# Patient Record
Sex: Male | Born: 1965 | Race: Black or African American | Hispanic: No | Marital: Married | Smoking: Former smoker
Health system: Southern US, Community
[De-identification: ages and names within clinical notes are randomized; demographics above are authoritative.]

## PROBLEM LIST (undated history)

## (undated) DIAGNOSIS — M1990 Unspecified osteoarthritis, unspecified site: Secondary | ICD-10-CM

## (undated) DIAGNOSIS — Z720 Tobacco use: Secondary | ICD-10-CM

## (undated) DIAGNOSIS — I208 Other forms of angina pectoris: Secondary | ICD-10-CM

## (undated) DIAGNOSIS — T7840XA Allergy, unspecified, initial encounter: Secondary | ICD-10-CM

## (undated) DIAGNOSIS — K922 Gastrointestinal hemorrhage, unspecified: Secondary | ICD-10-CM

## (undated) DIAGNOSIS — R079 Chest pain, unspecified: Secondary | ICD-10-CM

## (undated) DIAGNOSIS — I2089 Other forms of angina pectoris: Secondary | ICD-10-CM

## (undated) DIAGNOSIS — K2970 Gastritis, unspecified, without bleeding: Secondary | ICD-10-CM

## (undated) DIAGNOSIS — I10 Essential (primary) hypertension: Secondary | ICD-10-CM

## (undated) DIAGNOSIS — D649 Anemia, unspecified: Secondary | ICD-10-CM

## (undated) DIAGNOSIS — G4730 Sleep apnea, unspecified: Secondary | ICD-10-CM

## (undated) DIAGNOSIS — K219 Gastro-esophageal reflux disease without esophagitis: Secondary | ICD-10-CM

## (undated) DIAGNOSIS — E785 Hyperlipidemia, unspecified: Secondary | ICD-10-CM

## (undated) HISTORY — DX: Sleep apnea, unspecified: G47.30

## (undated) HISTORY — DX: Tobacco use: Z72.0

## (undated) HISTORY — DX: Allergy, unspecified, initial encounter: T78.40XA

## (undated) HISTORY — DX: Chest pain, unspecified: R07.9

## (undated) HISTORY — DX: Essential (primary) hypertension: I10

## (undated) HISTORY — DX: Unspecified osteoarthritis, unspecified site: M19.90

## (undated) HISTORY — DX: Hyperlipidemia, unspecified: E78.5

## (undated) HISTORY — PX: CARDIAC CATHETERIZATION: SHX172

## (undated) HISTORY — DX: Anemia, unspecified: D64.9

## (undated) HISTORY — PX: TOOTH EXTRACTION: SUR596

## (undated) HISTORY — DX: Gastrointestinal hemorrhage, unspecified: K92.2

---

## 1997-06-04 ENCOUNTER — Emergency Department (HOSPITAL_COMMUNITY): Admission: EM | Admit: 1997-06-04 | Discharge: 1997-06-04 | Payer: Self-pay | Admitting: Emergency Medicine

## 2000-02-29 ENCOUNTER — Emergency Department (HOSPITAL_COMMUNITY): Admission: EM | Admit: 2000-02-29 | Discharge: 2000-02-29 | Payer: Self-pay

## 2003-12-25 ENCOUNTER — Emergency Department (HOSPITAL_COMMUNITY): Admission: EM | Admit: 2003-12-25 | Discharge: 2003-12-25 | Payer: Self-pay | Admitting: Emergency Medicine

## 2005-11-15 ENCOUNTER — Emergency Department (HOSPITAL_COMMUNITY): Admission: EM | Admit: 2005-11-15 | Discharge: 2005-11-15 | Payer: Self-pay | Admitting: Emergency Medicine

## 2012-02-15 ENCOUNTER — Emergency Department (HOSPITAL_COMMUNITY)
Admission: EM | Admit: 2012-02-15 | Discharge: 2012-02-15 | Disposition: A | Discharge status: Home / Self Care | Payer: Medicaid Other | Attending: Emergency Medicine | Admitting: Emergency Medicine

## 2012-02-15 ENCOUNTER — Encounter (HOSPITAL_COMMUNITY): Payer: Self-pay | Admitting: Emergency Medicine

## 2012-02-15 DIAGNOSIS — I10 Essential (primary) hypertension: Secondary | ICD-10-CM | POA: Insufficient documentation

## 2012-02-15 DIAGNOSIS — S93409A Sprain of unspecified ligament of unspecified ankle, initial encounter: Secondary | ICD-10-CM | POA: Insufficient documentation

## 2012-02-15 DIAGNOSIS — Y929 Unspecified place or not applicable: Secondary | ICD-10-CM | POA: Insufficient documentation

## 2012-02-15 DIAGNOSIS — Y939 Activity, unspecified: Secondary | ICD-10-CM | POA: Insufficient documentation

## 2012-02-15 DIAGNOSIS — R269 Unspecified abnormalities of gait and mobility: Secondary | ICD-10-CM | POA: Insufficient documentation

## 2012-02-15 DIAGNOSIS — Z79899 Other long term (current) drug therapy: Secondary | ICD-10-CM | POA: Insufficient documentation

## 2012-02-15 DIAGNOSIS — F17200 Nicotine dependence, unspecified, uncomplicated: Secondary | ICD-10-CM | POA: Insufficient documentation

## 2012-02-15 DIAGNOSIS — X500XXA Overexertion from strenuous movement or load, initial encounter: Secondary | ICD-10-CM | POA: Insufficient documentation

## 2012-02-15 NOTE — ED Notes (Signed)
Pt reports pain and swelling in back of l/heel x 1 week

## 2012-02-15 NOTE — ED Provider Notes (Signed)
History   This chart was scribed for Darnelle Going, a non-physician practitioner working with Juliet Rude. Rubin Payor, MD by Lewanda Rife, ED Scribe. This patient was seen in room WTR8/WTR8 and the patient's care was started at 3:45 pm.   CSN: [id number]  Arrival date & time 02/15/12  1439   First MD Initiated Contact with Patient 02/15/12 1528      Chief Complaint  Patient presents with  . Joint Swelling  . Foot Pain    (Consider location/radiation/quality/duration/timing/severity/associated sxs/prior treatment) HPI Lee Krause is a 47 y.o. male who presents to the Emergency Department complaining of worsening constant left ankle pain onset 1 week. Pt states he pulled off his galoshes and yanked of the  on his left foot. Pt reports mild swelling to posterior left ankle. Pt reports pain is moderate in severity at this time. Pt denies any other injuries. Pt reports he has been limping for 2 days. Pt denies taking any pain medications at home to treat pain.    History reviewed. No pertinent past medical history.  History reviewed. No pertinent past surgical history.  Family History  Problem Relation Age of Onset  . Hypertension Father     History  Substance Use Topics  . Smoking status: Current Every Day Smoker    Types: Cigarettes  . Smokeless tobacco: Not on file  . Alcohol Use: Yes      Review of Systems  Constitutional: Negative.   HENT: Negative.   Respiratory: Negative.   Cardiovascular: Negative.   Gastrointestinal: Negative.   Musculoskeletal: Positive for joint swelling and gait problem (limping).       Left ankle pain  Skin: Negative.   Neurological: Negative.   Hematological: Negative.   Psychiatric/Behavioral: Negative.   All other systems reviewed and are negative.   A complete 10 system review of systems was obtained and all systems are negative except as noted in the HPI and PMH.    Allergies  Review of patient's allergies indicates no  known allergies.  Home Medications   Current Outpatient Rx  Name  Route  Sig  Dispense  Refill  . IBUPROFEN 200 MG PO TABS   Oral   Take 400 mg by mouth every 6 (six) hours as needed.         Marland Kitchen RANITIDINE HCL 150 MG PO CAPS   Oral   Take 150 mg by mouth daily as needed. Acid reflux.           BP 133/75  Pulse 63  Temp 98.6 F (37 C) (Oral)  Resp 18  SpO2 100%  Physical Exam  Nursing note and vitals reviewed. Constitutional: He is oriented to person, place, and time. He appears well-developed and well-nourished. No distress.  HENT:  Head: Normocephalic and atraumatic.  Eyes: Conjunctivae normal and EOM are normal.  Neck: Neck supple. No tracheal deviation present.  Cardiovascular: Normal rate.   Pulmonary/Chest: Effort normal. No respiratory distress.  Musculoskeletal: Normal range of motion. He exhibits edema (mild to left ankle) and tenderness.       Mild tenderness to talofibular ligament    Neurological: He is alert and oriented to person, place, and time. He has normal strength.  Skin: Skin is warm and dry.  Psychiatric: He has a normal mood and affect. His behavior is normal.    ED Course  Procedures (including critical care time) 3:50 PM Pt given cam walker boot and referral to follow up with orthopedic. Pt informed he will be  able to transition out of the boot in a week.    Medications  ibuprofen (ADVIL,MOTRIN) 200 MG tablet (not administered)  ranitidine (ZANTAC) 150 MG capsule (not administered)    Labs Reviewed - No data to display No results found.   1. Ankle sprain       MDM  Pt has been advised of the symptoms that warrant their return to the ED. Patient has voiced understanding and has agreed to follow-up with the PCP or specialist.    I personally performed the services described in this documentation, which was scribed in my presence. The recorded information has been reviewed and is accurate.    Dorthula Matas, PA 02/16/12  [phone]

## 2012-02-17 NOTE — ED Provider Notes (Signed)
Medical screening examination/treatment/procedure(s) were performed by non-physician practitioner and as supervising physician I was immediately available for consultation/collaboration.  Brylinn Teaney R. Treyveon Mochizuki, MD 02/17/12 1456 

## 2012-11-03 ENCOUNTER — Emergency Department (HOSPITAL_COMMUNITY): Payer: Medicaid Other

## 2012-11-03 ENCOUNTER — Encounter (HOSPITAL_COMMUNITY): Payer: Self-pay | Admitting: Emergency Medicine

## 2012-11-03 ENCOUNTER — Emergency Department (HOSPITAL_COMMUNITY)
Admission: EM | Admit: 2012-11-03 | Discharge: 2012-11-03 | Disposition: A | Discharge status: Home / Self Care | Payer: Medicaid Other | Attending: Emergency Medicine | Admitting: Emergency Medicine

## 2012-11-03 DIAGNOSIS — F17200 Nicotine dependence, unspecified, uncomplicated: Secondary | ICD-10-CM | POA: Insufficient documentation

## 2012-11-03 DIAGNOSIS — R0789 Other chest pain: Secondary | ICD-10-CM

## 2012-11-03 DIAGNOSIS — Z7982 Long term (current) use of aspirin: Secondary | ICD-10-CM | POA: Insufficient documentation

## 2012-11-03 DIAGNOSIS — Z79899 Other long term (current) drug therapy: Secondary | ICD-10-CM | POA: Insufficient documentation

## 2012-11-03 LAB — CBC
HCT: 38.9 % — ABNORMAL LOW (ref 39.0–52.0)
Hemoglobin: 13.7 g/dL (ref 13.0–17.0)
MCH: 30.9 pg (ref 26.0–34.0)
MCV: 87.6 fL (ref 78.0–100.0)
RBC: 4.44 MIL/uL (ref 4.22–5.81)
RDW: 12.5 % (ref 11.5–15.5)
WBC: 10.3 10*3/uL (ref 4.0–10.5)

## 2012-11-03 LAB — BASIC METABOLIC PANEL
BUN: 19 mg/dL (ref 6–23)
CO2: 22 mEq/L (ref 19–32)
Chloride: 100 mEq/L (ref 96–112)
Creatinine, Ser: 1.24 mg/dL (ref 0.50–1.35)
Glucose, Bld: 101 mg/dL — ABNORMAL HIGH (ref 70–99)

## 2012-11-03 LAB — POCT I-STAT TROPONIN I

## 2012-11-03 NOTE — ED Notes (Signed)
Pt states he feels aas if his heart is skipping a beat denies any pain but says it feels like a pressure especially when laying down. Denies SOB or dizziness. States is in no distress at the moment

## 2012-11-03 NOTE — ED Provider Notes (Signed)
CSN: [id number]     Arrival date & time 11/03/12  1715 History   First MD Initiated Contact with Patient 11/03/12 1821     Chief Complaint  Patient presents with  . Chest Pain   (Consider location/radiation/quality/duration/timing/severity/associated sxs/prior Treatment) Patient is a 47 y.o. male presenting with chest pain. The history is provided by the patient.  Chest Pain  patient complains of 6 months of constant left-sided chest pain worse in the morning that gradually eases up throughout the day. No associated dyspnea, diaphoresis, nausea vomiting. No fever or chills. No syncope or near-syncope. No anginal type symptoms. Patient exercises regularly and does not have any symptoms when he does this. Patient has a strong family history of CAD which is why he came in today. Patient denies any associated abdominal discomfort. Symptoms have not improved with rest and no treatment used prior to arrival  History reviewed. No pertinent past medical history. History reviewed. No pertinent past surgical history. Family History  Problem Relation Age of Onset  . Hypertension Father    History  Substance Use Topics  . Smoking status: Current Every Day Smoker    Types: Cigarettes  . Smokeless tobacco: Not on file  . Alcohol Use: Yes    Review of Systems  Cardiovascular: Positive for chest pain.  All other systems reviewed and are negative.    Allergies  Review of patient's allergies indicates no known allergies.  Home Medications   Current Outpatient Rx  Name  Route  Sig  Dispense  Refill  . aspirin 81 MG chewable tablet   Oral   Chew 81 mg by mouth daily.         . Multiple Vitamin (MULTIVITAMIN WITH MINERALS) TABS tablet   Oral   Take 1 tablet by mouth daily.         . ranitidine (ZANTAC) 150 MG capsule   Oral   Take 150 mg by mouth daily as needed. Acid reflux.          There were no vitals taken for this visit. Physical Exam  Nursing note and vitals  reviewed. Constitutional: He is oriented to person, place, and time. He appears well-developed and well-nourished.  Non-toxic appearance. No distress.  HENT:  Head: Normocephalic and atraumatic.  Eyes: Conjunctivae, EOM and lids are normal. Pupils are equal, round, and reactive to light.  Neck: Normal range of motion. Neck supple. No tracheal deviation present. No mass present.  Cardiovascular: Normal rate, regular rhythm and normal heart sounds.  Exam reveals no gallop.   No murmur heard. Pulmonary/Chest: Effort normal and breath sounds normal. No stridor. No respiratory distress. He has no decreased breath sounds. He has no wheezes. He has no rhonchi. He has no rales.  Abdominal: Soft. Normal appearance and bowel sounds are normal. He exhibits no distension. There is no tenderness. There is no rebound and no CVA tenderness.  Musculoskeletal: Normal range of motion. He exhibits no edema and no tenderness.  Neurological: He is alert and oriented to person, place, and time. He has normal strength. No cranial nerve deficit or sensory deficit. GCS eye subscore is 4. GCS verbal subscore is 5. GCS motor subscore is 6.  Skin: Skin is warm and dry. No abrasion and no rash noted.  Psychiatric: He has a normal mood and affect. His speech is normal and behavior is normal.    ED Course  Procedures (including critical care time) Labs Review Labs Reviewed  CBC - Abnormal; Notable for the following:  HCT 38.9 (*)    All other components within normal limits  BASIC METABOLIC PANEL - Abnormal; Notable for the following:    Glucose, Bld 101 (*)    GFR calc non Af Amer 69 (*)    GFR calc Af Amer 80 (*)    All other components within normal limits  POCT I-STAT TROPONIN I   Imaging Review No results found.  EKG Interpretation     Ventricular Rate:  88 PR Interval:  134 QRS Duration: 80 QT Interval:  341 QTC Calculation: 412 R Axis:   68 Text Interpretation:  Sinus rhythm Probable left  ventricular hypertrophy Borderline T abnormalities, inferior leads            MDM  No diagnosis found.   Patient with negative delta troponin here. Symptoms are very atypical for angina or PE. No concern for dissection. He will be given cardiology referral  Toy Baker, MD 11/03/12 2203

## 2012-11-03 NOTE — ED Notes (Signed)
Assumed care of patient s/p report Introduced self to patient and pt's wife Plan of care reviewed--agree and v/u Patient denies c/o CP, SOB or generalized pain LCTA VS updated and stable Patient and pt's wife deny further needs at this time Side rails up, call bell in reach

## 2012-11-03 NOTE — Progress Notes (Signed)
Patient confirms he does not have a pcp.  Patient reports he has Medicaid insurance but that will change in January.  [facility] provided patient with a list of pcps who accept Medicaid insurance in [place].  Patient thankful for resources.

## 2012-11-09 ENCOUNTER — Encounter (HOSPITAL_COMMUNITY): Payer: Self-pay | Admitting: Emergency Medicine

## 2012-11-09 ENCOUNTER — Emergency Department (HOSPITAL_COMMUNITY)
Admission: EM | Admit: 2012-11-09 | Discharge: 2012-11-09 | Disposition: A | Discharge status: Home / Self Care | Payer: Medicaid Other | Attending: Emergency Medicine | Admitting: Emergency Medicine

## 2012-11-09 DIAGNOSIS — Z7982 Long term (current) use of aspirin: Secondary | ICD-10-CM | POA: Insufficient documentation

## 2012-11-09 DIAGNOSIS — K2970 Gastritis, unspecified, without bleeding: Secondary | ICD-10-CM | POA: Insufficient documentation

## 2012-11-09 DIAGNOSIS — M25579 Pain in unspecified ankle and joints of unspecified foot: Secondary | ICD-10-CM | POA: Insufficient documentation

## 2012-11-09 DIAGNOSIS — F17200 Nicotine dependence, unspecified, uncomplicated: Secondary | ICD-10-CM | POA: Insufficient documentation

## 2012-11-09 DIAGNOSIS — Z79899 Other long term (current) drug therapy: Secondary | ICD-10-CM | POA: Insufficient documentation

## 2012-11-09 DIAGNOSIS — G8929 Other chronic pain: Secondary | ICD-10-CM

## 2012-11-09 MED ORDER — HYDROCODONE-ACETAMINOPHEN 5-325 MG PO TABS: 1.0000 | ORAL_TABLET | Freq: Four times a day (QID) | ORAL | Status: DC | PRN

## 2012-11-09 MED ORDER — HYDROCODONE-ACETAMINOPHEN 5-325 MG PO TABS
2.0000 | ORAL_TABLET | Freq: Once | ORAL | Status: AC
  Administered 2012-11-09: 2 via ORAL
  Filled 2012-11-09: qty 2

## 2012-11-09 MED ORDER — ESOMEPRAZOLE MAGNESIUM 40 MG PO CPDR: 40.0000 mg | DELAYED_RELEASE_CAPSULE | Freq: Every day | ORAL | Status: DC

## 2012-11-09 NOTE — ED Provider Notes (Signed)
CSN: [id number]     Arrival date & time 11/09/12  1347 History  This chart was scribed for non-physician practitioner Arthor Captain, PA-C working with Shelda Jakes, MD by Valera Castle, ED scribe. This patient was seen in room TR05C/TR05C and the patient's care was started at 2:48 PM.    Chief Complaint  Patient presents with  . Foot Pain   The history is provided by the patient. No language interpreter was used.   HPI Comments: Lee Krause is a 47 y.o. male with h/o left foot pain who presents to the Emergency Department complaining of sudden, moderate, constant, left foot and heel pain, with swelling, onset yesterday evening. He states that walking and standing exacerbates the pain. He reports taking 800 mg Ibuprofen, with little relief. He reports his h/o foot pain usually comes and goes about once every other month. He reports being seen here 2 months ago for the same symptoms, but that his recent pain is worse than the pain during his first visit. He reports that the boot he recieved initially helped, and he states he also bought some Dr. Mollie Germany foot pads, with relief, but that his current pain is worse than usual. He denies calf pain, and any other associated symptoms. He reports smoking everyday. He reports a h/o gastritis and hematochezia. He reports taking Zantac for his gastritis, with some relief. He reports being seen for recent chest pain, and having tests come back negative. He reports his BP has been normally good. He denies any other medical history.   PCP - No primary provider on file.  History reviewed. No pertinent past medical history. History reviewed. No pertinent past surgical history. Family History  Problem Relation Age of Onset  . Hypertension Father    History  Substance Use Topics  . Smoking status: Current Every Day Smoker    Types: Cigarettes  . Smokeless tobacco: Not on file  . Alcohol Use: Yes    Review of Systems  Musculoskeletal: Positive  for arthralgias (left foot and heel) and gait problem. Negative for joint swelling and myalgias (negative for left calf pain).  Skin: Negative for color change.  Neurological: Negative for weakness and numbness.   Allergies  Review of patient's allergies indicates no known allergies.  Home Medications   Current Outpatient Rx  Name  Route  Sig  Dispense  Refill  . aspirin 81 MG chewable tablet   Oral   Chew 81 mg by mouth daily.         . Multiple Vitamin (MULTIVITAMIN WITH MINERALS) TABS tablet   Oral   Take 1 tablet by mouth daily.         . ranitidine (ZANTAC) 150 MG capsule   Oral   Take 150 mg by mouth daily as needed. Acid reflux.          Triage Vitals: BP 142/95  Pulse 78  Temp(Src) 98.5 F (36.9 C) (Oral)  Resp 16  Wt 232 lb (105.235 kg)  SpO2 98%  Physical Exam  Nursing note and vitals reviewed. Constitutional: He is oriented to person, place, and time. He appears well-developed and well-nourished. No distress.  HENT:  Head: Normocephalic and atraumatic.  Eyes: EOM are normal.  Neck: Neck supple. No tracheal deviation present.  Cardiovascular: Normal rate.   Pulmonary/Chest: Effort normal. No respiratory distress.  Musculoskeletal: Normal range of motion.  Ttp over left achilles tendon insertion. No swelling or deformity. Tenderness over medial heel.   Neurological: He is alert  and oriented to person, place, and time.  Skin: Skin is warm and dry.  Psychiatric: He has a normal mood and affect. His behavior is normal.    ED Course  Procedures (including critical care time)  DIAGNOSTIC STUDIES: Oxygen Saturation is 98% on room air, normal by my interpretation.    COORDINATION OF CARE: 2:59 PM - Discussed treatment plan with pt at bedside and pt agreed to plan. Discussed with pt the problems of prescribing NSAIDS, due to pt's h/o gastritis. Advised pt to ice the area 3-4 times a day. Advised pt to switch medication for his gastritis to Prilosec.    Labs Review Labs Reviewed - No data to display Imaging Review No results found.  EKG Interpretation   None      No orders of the defined types were placed in this encounter.    MDM   1. Heel pain, chronic, left   patient sxs consistent with achilles tendinitis considering his pain is worsened with activity. Patient will need ortho or podiatric follow up and I will give referral. Patient given supportive care instructions including RICE and sports rehab instructions. Follow up as directed.      I personally performed the services described in this documentation, which was scribed in my presence. The recorded information has been reviewed and is accurate.      Arthor Captain, PA-C 11/09/12 2044

## 2012-11-09 NOTE — ED Notes (Signed)
Pt c/o left heel pain for several months. Has been seen preciously and given a Cam walker which he has on at this time.

## 2012-11-09 NOTE — ED Notes (Signed)
PA at bedside.

## 2012-11-09 NOTE — ED Notes (Signed)
Left heel and foot pain some swelling  Worse last night states that he has been seen for same in the past wearing unaboot from past visit no new injury

## 2012-11-11 NOTE — ED Provider Notes (Signed)
Medical screening examination/treatment/procedure(s) were performed by non-physician practitioner and as supervising physician I was immediately available for consultation/collaboration.  EKG Interpretation   None         Carliss Porcaro W. Chenoa Luddy, MD 11/11/12 0919 

## 2012-11-15 ENCOUNTER — Encounter: Payer: Self-pay | Admitting: Interventional Cardiology

## 2012-11-15 ENCOUNTER — Ambulatory Visit (INDEPENDENT_AMBULATORY_CARE_PROVIDER_SITE_OTHER): Payer: Medicaid Other | Admitting: Interventional Cardiology

## 2012-11-15 VITALS — BP 136/77 | HR 76 | Ht 76.0 in | Wt 230.4 lb

## 2012-11-15 DIAGNOSIS — F17200 Nicotine dependence, unspecified, uncomplicated: Secondary | ICD-10-CM

## 2012-11-15 DIAGNOSIS — Z8249 Family history of ischemic heart disease and other diseases of the circulatory system: Secondary | ICD-10-CM

## 2012-11-15 DIAGNOSIS — R079 Chest pain, unspecified: Secondary | ICD-10-CM

## 2012-11-15 NOTE — Progress Notes (Signed)
Patient ID: TRE SANKER, male   DOB: 1965-04-02, 47 y.o.   MRN: [id number]     Patient ID: HUMBERT MOROZOV MRN: [id number] DOB/AGE: [birth year] 47 y.o.   Referring Physician [facility]   Reason for Consultation Chest pain  HPI: 47 y/o who has a Family H/o early CAD.  His brother died of MI at age 55.  He has had a chest pressure going for months.  Episodes can last several minutes.  He has occasional palpitations.  No SHOB.  He exercises without problems.  He does not do any cardio exercises.  He walks regularly, walkig his dog, without problems.    He sits and relaxes to help his discomfort go away.  He has had problems with spicy foods causing indigestion.  Discomfort can move from the center of his chest to the left side of his chest.    Current Outpatient Prescriptions  Medication Sig Dispense Refill  . aspirin 81 MG chewable tablet Chew 81 mg by mouth daily.      Marland Kitchen HYDROcodone-acetaminophen (NORCO) 5-325 MG per tablet Take 1-2 tablets by mouth every 6 (six) hours as needed for pain.  20 tablet  0  . Multiple Vitamin (MULTIVITAMIN WITH MINERALS) TABS tablet Take 1 tablet by mouth daily.      . ranitidine (ZANTAC) 150 MG capsule Take 150 mg by mouth daily as needed. Acid reflux.       No current facility-administered medications for this visit.   No past medical history on file.  Family History  Problem Relation Age of Onset  . Hypertension Father     History   Social History  . Marital Status: Single    Spouse Name: N/A    Number of Children: N/A  . Years of Education: N/A   Occupational History  . Not on file.   Social History Main Topics  . Smoking status: Current Every Day Smoker    Types: Cigarettes  . Smokeless tobacco: Not on file  . Alcohol Use: Yes  . Drug Use: No  . Sexual Activity:    Other Topics Concern  . Not on file   Social History Narrative  . No narrative on file    No past surgical history on file.    (Not in a hospital  admission)  Review of systems complete and found to be negative unless listed above .  No nausea, vomiting.  No fever chills, No focal weakness,  No palpitations.  Physical Exam: Filed Vitals:   11/15/12 1440  BP: 136/77  Pulse: 76    Weight: 230 lb 6.4 oz (104.509 kg)  Physical exam:  [place]/AT EOMI No JVD, No carotid bruit RRR S1S2  No wheezing Soft. NT, nondistended No edema. No focal motor or sensory deficits Normal affect  Labs:   Lab Results  Component Value Date   WBC 10.3 11/03/2012   HGB 13.7 11/03/2012   HCT 38.9* 11/03/2012   MCV 87.6 11/03/2012   PLT 309 11/03/2012   No results found for this basename: NA, K, CL, CO2, BUN, CREATININE, CALCIUM, LABALBU, PROT, BILITOT, ALKPHOS, ALT, AST, GLUCOSE,  in the last 168 hours Lab Results  Component Value Date   TROPONINI <0.30 11/03/2012    No results found for this basename: CHOL   No results found for this basename: HDL   No results found for this basename: LDLCALC   No results found for this basename: TRIG   No results found for this basename: CHOLHDL  No results found for this basename: LDLDIRECT       EKG:NSR, inf nonspecific ST segment changes  ASSESSMENT AND PLAN:  1. Chest pain: Atypical features. Doubt ischemia. However, he does have a significant family history of coronary artery disease. His younger brother had a fatal MI at age 6. We'll plan for exercise treadmill test. His baseline ECG has some nonspecific ST segment changes which may make it difficult to evaluate for ischemia. We'll nonetheless see whether he has any symptoms with exertion and get a baseline exercise tolerance.  2. Tobacco abuse: I stressed the importance of stopping smoking, especially given his family history of coronary artery disease. He states that he has cut back significantly. Signed:   Fredric Mare, MD, [facility] 11/15/2012, 3:07 PM

## 2012-11-15 NOTE — Patient Instructions (Signed)
Your physician has requested that you have an exercise tolerance test. For further information please visit [email or web address]. Please also follow instruction sheet, as given.  Your physician wants you to follow-up in:  1 year with Dr. Varanasi.  You will receive a reminder letter in the mail two months in advance. If you don't receive a letter, please call our office to schedule the follow-up appointment.  

## 2012-11-18 DIAGNOSIS — Z8249 Family history of ischemic heart disease and other diseases of the circulatory system: Secondary | ICD-10-CM | POA: Insufficient documentation

## 2012-11-18 DIAGNOSIS — F17200 Nicotine dependence, unspecified, uncomplicated: Secondary | ICD-10-CM | POA: Insufficient documentation

## 2012-11-25 ENCOUNTER — Ambulatory Visit (INDEPENDENT_AMBULATORY_CARE_PROVIDER_SITE_OTHER): Payer: Medicaid Other | Admitting: Nurse Practitioner

## 2012-11-25 ENCOUNTER — Encounter: Payer: Self-pay | Admitting: Nurse Practitioner

## 2012-11-25 VITALS — BP 140/95 | HR 68

## 2012-11-25 DIAGNOSIS — R079 Chest pain, unspecified: Secondary | ICD-10-CM

## 2012-11-25 LAB — BASIC METABOLIC PANEL
BUN: 15 mg/dL (ref 6–23)
CO2: 26 mEq/L (ref 19–32)
Calcium: 9.3 mg/dL (ref 8.4–10.5)
Chloride: 104 mEq/L (ref 96–112)
Creatinine, Ser: 1.1 mg/dL (ref 0.4–1.5)
GFR: 95.11 mL/min (ref >60.00)
Glucose, Bld: 101 mg/dL — ABNORMAL HIGH (ref 70–99)
Potassium: 4.2 mEq/L (ref 3.5–5.1)
Sodium: 136 mEq/L (ref 135–145)

## 2012-11-25 LAB — LIPID PANEL
Cholesterol: 226 mg/dL — ABNORMAL HIGH (ref 0–200)
HDL: 49 mg/dL (ref >39.00)
Total CHOL/HDL Ratio: 5
Triglycerides: 295 mg/dL — ABNORMAL HIGH (ref 0.0–149.0)
VLDL: 59 mg/dL — ABNORMAL HIGH (ref 0.0–40.0)

## 2012-11-25 LAB — HEPATIC FUNCTION PANEL
ALT: 22 U/L (ref 0–53)
AST: 23 U/L (ref 0–37)
Albumin: 4 g/dL (ref 3.5–5.2)
Alkaline Phosphatase: 45 U/L (ref 39–117)
Bilirubin, Direct: 0 mg/dL (ref 0.0–0.3)
Total Bilirubin: 0.9 mg/dL (ref 0.3–1.2)
Total Protein: 7.8 g/dL (ref 6.0–8.3)

## 2012-11-25 NOTE — Patient Instructions (Signed)
We will check labs today  We will get an ultrasound of your heart  Stop smoking

## 2012-11-25 NOTE — Progress Notes (Signed)
Exercise Treadmill Test  Pre-Exercise Testing Evaluation Rhythm: normal sinus  Rate: 76     Test  Exercise Tolerance Test Ordering MD: Eldridge Dace, MD  Interpreting MD: Norma Fredrickson, NP  Unique Test No: 1  Treadmill:  1  Indication for ETT: chest pain - rule out ischemia  Contraindication to ETT: No   Stress Modality: exercise - treadmill  Cardiac Imaging Performed: non   Protocol: standard Bruce - maximal  Max BP:  214/78  Max MPHR (bpm):  176 85% MPR (bpm):  150  MPHR obtained (bpm):  160 % MPHR obtained:  90%  Reached 85% MPHR (min:sec):  7:40 Total Exercise Time (min-sec):  9 minutes  Workload in METS:  10.1 Borg Scale: 13  Reason ETT Terminated:  desired heart rate attained    ST Segment Analysis At Rest: Resting ST/T wave changes.  With Exercise: no evidence of significant ST depression  Other Information Arrhythmia:  No Angina during ETT:  absent (0) Quality of ETT:  diagnostic  ETT Interpretation:  normal - no evidence of ischemia by ST analysis  Comments: Patient presents today for routine GXT. Has had atypical chest pain and has a strikingly positive FH for CAD - brother died at age 4.   Today the patient exercised on the standard Bruce protocol for a total of 9 minutes.  Good exercise tolerance.  Mildly hypertensive blood pressure response.  Clinically negative for chest pain. Test was stopped due to achievement of target HR.  EKG negative for ischemia. No significant arrhythmia noted. Frequent PACs and PVCs noted in recovery  Recommendations: Smoking cessation CV risk factor modification Echo to evaluate LV function and PVCs  See back prn  Patient is agreeable to this plan and will call if any problems develop in the interim.   Rosalio Macadamia, RN, ANP-C [facility] [address] [place], [place]  [address]

## 2012-11-28 LAB — LDL CHOLESTEROL, DIRECT: Direct LDL: 130.6 mg/dL

## 2012-11-29 ENCOUNTER — Telehealth: Payer: Self-pay | Admitting: Interventional Cardiology

## 2012-11-29 NOTE — Telephone Encounter (Signed)
Lm for pt to rc to Brien Mates and she left him a vm to call back.

## 2012-11-29 NOTE — Telephone Encounter (Signed)
New Problem:  Pt states he would like his recent test results.

## 2012-12-05 ENCOUNTER — Encounter: Payer: Self-pay | Admitting: *Deleted

## 2012-12-07 ENCOUNTER — Ambulatory Visit (HOSPITAL_COMMUNITY): Payer: Medicaid Other | Attending: Cardiology | Admitting: Radiology

## 2012-12-07 ENCOUNTER — Encounter: Payer: Self-pay | Admitting: Cardiology

## 2012-12-07 DIAGNOSIS — R072 Precordial pain: Secondary | ICD-10-CM

## 2012-12-07 DIAGNOSIS — Z8249 Family history of ischemic heart disease and other diseases of the circulatory system: Secondary | ICD-10-CM | POA: Insufficient documentation

## 2012-12-07 DIAGNOSIS — R079 Chest pain, unspecified: Secondary | ICD-10-CM | POA: Insufficient documentation

## 2012-12-07 DIAGNOSIS — R002 Palpitations: Secondary | ICD-10-CM | POA: Insufficient documentation

## 2012-12-07 DIAGNOSIS — I079 Rheumatic tricuspid valve disease, unspecified: Secondary | ICD-10-CM | POA: Insufficient documentation

## 2012-12-07 NOTE — Progress Notes (Signed)
Echocardiogram performed.  

## 2014-01-23 ENCOUNTER — Encounter (HOSPITAL_COMMUNITY): Payer: Self-pay | Admitting: Emergency Medicine

## 2014-01-23 ENCOUNTER — Emergency Department (HOSPITAL_COMMUNITY)
Admission: EM | Admit: 2014-01-23 | Discharge: 2014-01-23 | Disposition: A | Discharge status: Home / Self Care | Payer: Medicaid Other | Attending: Emergency Medicine | Admitting: Emergency Medicine

## 2014-01-23 ENCOUNTER — Emergency Department (HOSPITAL_COMMUNITY): Payer: Medicaid Other

## 2014-01-23 DIAGNOSIS — Z791 Long term (current) use of non-steroidal anti-inflammatories (NSAID): Secondary | ICD-10-CM | POA: Insufficient documentation

## 2014-01-23 DIAGNOSIS — Z720 Tobacco use: Secondary | ICD-10-CM | POA: Insufficient documentation

## 2014-01-23 DIAGNOSIS — Z7982 Long term (current) use of aspirin: Secondary | ICD-10-CM | POA: Insufficient documentation

## 2014-01-23 DIAGNOSIS — M25562 Pain in left knee: Secondary | ICD-10-CM

## 2014-01-23 MED ORDER — IBUPROFEN 200 MG PO TABS
600.0000 mg | ORAL_TABLET | Freq: Once | ORAL | Status: AC
Start: 2014-01-23 — End: 2014-01-23
  Administered 2014-01-23: 600 mg via ORAL
  Filled 2014-01-23: qty 3

## 2014-01-23 NOTE — Discharge Instructions (Signed)
Arthralgia Arthralgia is joint pain. A joint is a place where two bones meet. Joint pain can happen for many reasons. The joint can be bruised, stiff, infected, or weak from aging. Pain usually goes away after resting and taking medicine for soreness.  HOME CARE  Rest the joint as told by your doctor.  Keep the sore joint raised (elevated) for the first 24 hours.  Put ice on the joint area.  Put ice in a plastic bag.  Place a towel between your skin and the bag.  Leave the ice on for 15-20 minutes, 03-04 times a day.  Wear your splint, casting, elastic bandage, or sling as told by your doctor.  Only take medicine as told by your doctor. Do not take aspirin.  Use crutches as told by your doctor. Do not put weight on the joint until told to by your doctor. GET HELP RIGHT AWAY IF:   You have bruising, puffiness (swelling), or more pain.  Your fingers or toes turn blue or start to lose feeling (numb).  Your medicine does not lessen the pain.  Your pain becomes severe.  You have a temperature by mouth above 102 F (38.9 C), not controlled by medicine.  You cannot move or use the joint. MAKE SURE YOU:   Understand these instructions.  Will watch your condition.  Will get help right away if you are not doing well or get worse. Document Released: 12/17/2008 Document Revised: 03/23/2011 Document Reviewed: 12/17/2008 [facility] Patient Information 2015 [place], [place]. This information is not intended to replace advice given to you by your health care provider. Make sure you discuss any questions you have with your health care provider. Your xray is normal  You should take Ibuprofen/Advil on a regular basis for the next 5-7 days and wear the knee sleeve for support for the same period

## 2014-01-23 NOTE — ED Notes (Signed)
Pt c/o left knee pain x 1.5 weeks, denies injury states he thinks its just due to standing for long periods of time.

## 2014-01-23 NOTE — ED Provider Notes (Signed)
CSN: [id number]     Arrival date & time 01/23/14  1747 History  This chart was scribed for non-physician practitioner, Garald Balding, NP, working with Pamella Pert, MD, by Jeanell Sparrow, ED Scribe. This patient was seen in room [place] and the patient's care was started at 8:21 PM.  Chief Complaint  Patient presents with  . Knee Pain   The history is provided by the patient. No language interpreter was used.    HPI Comments: Lee Krause is a 49 y.o. male who presents to the Emergency Department complaining of constant moderate left knee pain that started 1.5 weeks ago. He reports that he was usually stands for long periods of time at work. He states that bending his leg a certain way exacerbates the pain. He reports no treatment PTA. He states that he has a hx of plantar fascitis.   Past Medical History  Diagnosis Date  . Chest pain   . Tobacco abuse    History reviewed. No pertinent past surgical history. Family History  Problem Relation Age of Onset  . Hypertension Father   . Heart attack Brother    History  Substance Use Topics  . Smoking status: Current Every Day Smoker    Types: Cigarettes  . Smokeless tobacco: Not on file  . Alcohol Use: Yes    Review of Systems  Musculoskeletal: Positive for arthralgias. Negative for joint swelling.  Skin: Negative for rash and wound.  Neurological: Negative for weakness and numbness.  All other systems reviewed and are negative.   Allergies  Review of patient's allergies indicates no known allergies.  Home Medications   Prior to Admission medications   Medication Sig Start Date End Date Taking? Authorizing Provider  aspirin 81 MG chewable tablet Chew 81 mg by mouth daily.   Yes Historical Provider, MD  ibuprofen (ADVIL,MOTRIN) 200 MG tablet Take 400 mg by mouth every 6 (six) hours as needed for headache or moderate pain.   Yes Historical Provider, MD  Multiple Vitamin (MULTIVITAMIN WITH MINERALS) TABS tablet Take 1  tablet by mouth daily.   Yes Historical Provider, MD  HYDROcodone-acetaminophen (NORCO) 5-325 MG per tablet Take 1-2 tablets by mouth every 6 (six) hours as needed for pain. Patient not taking: Reported on 01/23/2014 11/09/12   Margarita Mail, PA-C   BP 147/95 mmHg  Pulse 89  Temp(Src) 97.8 F (36.6 C) (Oral)  Resp 20  SpO2 98% Physical Exam  Constitutional: He is oriented to person, place, and time. He appears well-developed and well-nourished.  HENT:  Head: Normocephalic and atraumatic.  Eyes: Pupils are equal, round, and reactive to light.  Neck: Normal range of motion. Neck supple.  Cardiovascular: Normal rate.   Pulmonary/Chest: Effort normal.  Musculoskeletal: Normal range of motion. He exhibits no edema or tenderness.       Left knee: He exhibits normal range of motion, no swelling and no ecchymosis.  Neurological: He is alert and oriented to person, place, and time.  Skin: Skin is warm and dry. No erythema.  Psychiatric: He has a normal mood and affect. His behavior is normal.  Nursing note and vitals reviewed.   ED Course  Procedures (including critical care time) DIAGNOSTIC STUDIES: Oxygen Saturation is 98% on RA, normal by my interpretation.    COORDINATION OF CARE: 8:25 PM- Pt advised of plan for treatment which includes medication and radiology and pt agrees.  Labs Review Labs Reviewed - No data to display  Imaging Review Dg Knee Complete 4 Views  Left  01/23/2014   CLINICAL DATA:  Left anterior knee pain and swelling for 1 week.  EXAM: LEFT KNEE - COMPLETE 4+ VIEW  COMPARISON:  None.  FINDINGS: No fracture or dislocation. The alignment and joint spaces are maintained. Trace superior patellar spurring. Bone mineralization is normal. No joint effusion. No focal soft tissue abnormality.  IMPRESSION: Tiny patellar osteophyte. Otherwise unremarkable left knee radiographs.   Electronically Signed   By: Jeb Levering M.D.   On: 01/23/2014 18:33     EKG  Interpretation None      MDM   Final diagnoses:  Knee pain, acute, left    I personally performed the services described in this documentation, which was scribed in my presence. The recorded information has been reviewed and is accurate.    Garald Balding, NP 01/23/14 3143  Pamella Pert, MD 01/25/14 1102

## 2016-05-22 ENCOUNTER — Encounter (HOSPITAL_COMMUNITY): Payer: Self-pay | Admitting: Emergency Medicine

## 2016-05-22 ENCOUNTER — Emergency Department (HOSPITAL_COMMUNITY): Payer: Managed Care, Other (non HMO)

## 2016-05-22 ENCOUNTER — Observation Stay (HOSPITAL_COMMUNITY)
Admission: EM | Admit: 2016-05-22 | Discharge: 2016-05-23 | Disposition: A | Discharge status: Home / Self Care | Payer: Managed Care, Other (non HMO) | Attending: Internal Medicine | Admitting: Internal Medicine

## 2016-05-22 DIAGNOSIS — K922 Gastrointestinal hemorrhage, unspecified: Secondary | ICD-10-CM | POA: Diagnosis present

## 2016-05-22 DIAGNOSIS — K219 Gastro-esophageal reflux disease without esophagitis: Secondary | ICD-10-CM | POA: Diagnosis not present

## 2016-05-22 DIAGNOSIS — Z9889 Other specified postprocedural states: Secondary | ICD-10-CM | POA: Insufficient documentation

## 2016-05-22 DIAGNOSIS — F1010 Alcohol abuse, uncomplicated: Secondary | ICD-10-CM | POA: Diagnosis not present

## 2016-05-22 DIAGNOSIS — Z8249 Family history of ischemic heart disease and other diseases of the circulatory system: Secondary | ICD-10-CM | POA: Diagnosis not present

## 2016-05-22 DIAGNOSIS — K2970 Gastritis, unspecified, without bleeding: Secondary | ICD-10-CM | POA: Insufficient documentation

## 2016-05-22 DIAGNOSIS — K921 Melena: Secondary | ICD-10-CM | POA: Diagnosis not present

## 2016-05-22 DIAGNOSIS — F17210 Nicotine dependence, cigarettes, uncomplicated: Secondary | ICD-10-CM | POA: Diagnosis not present

## 2016-05-22 DIAGNOSIS — F17200 Nicotine dependence, unspecified, uncomplicated: Secondary | ICD-10-CM | POA: Diagnosis present

## 2016-05-22 HISTORY — DX: Gastro-esophageal reflux disease without esophagitis: K21.9

## 2016-05-22 HISTORY — DX: Gastritis, unspecified, without bleeding: K29.70

## 2016-05-22 LAB — COMPREHENSIVE METABOLIC PANEL
ALBUMIN: 4.1 g/dL (ref 3.5–5.0)
ALT: 21 U/L (ref 17–63)
ANION GAP: 9 (ref 5–15)
AST: 23 U/L (ref 15–41)
Alkaline Phosphatase: 62 U/L (ref 38–126)
BUN: 19 mg/dL (ref 6–20)
CO2: 22 mmol/L (ref 22–32)
Calcium: 9.2 mg/dL (ref 8.9–10.3)
Chloride: 107 mmol/L (ref 101–111)
Creatinine, Ser: 1.04 mg/dL (ref 0.61–1.24)
GFR calc Af Amer: >60 mL/min (ref >60)
GFR calc non Af Amer: >60 mL/min (ref >60)
GLUCOSE: 127 mg/dL — AB (ref 65–99)
POTASSIUM: 3.8 mmol/L (ref 3.5–5.1)
Sodium: 138 mmol/L (ref 135–145)
Total Bilirubin: 0.5 mg/dL (ref 0.3–1.2)
Total Protein: 7.5 g/dL (ref 6.5–8.1)

## 2016-05-22 LAB — LIPASE, BLOOD: LIPASE: 39 U/L (ref 11–51)

## 2016-05-22 LAB — CBC
HEMATOCRIT: 38.8 % — AB (ref 39.0–52.0)
HEMOGLOBIN: 13.7 g/dL (ref 13.0–17.0)
MCH: 30.7 pg (ref 26.0–34.0)
MCHC: 35.3 g/dL (ref 30.0–36.0)
MCV: 87 fL (ref 78.0–100.0)
Platelets: 283 10*3/uL (ref 150–400)
RBC: 4.46 MIL/uL (ref 4.22–5.81)
RDW: 12.8 % (ref 11.5–15.5)
WBC: 8.8 10*3/uL (ref 4.0–10.5)

## 2016-05-22 LAB — HEMOGLOBIN AND HEMATOCRIT, BLOOD
HCT: 39.3 % (ref 39.0–52.0)
HEMOGLOBIN: 13.6 g/dL (ref 13.0–17.0)

## 2016-05-22 LAB — TYPE AND SCREEN
ABO/RH(D): B NEG
ANTIBODY SCREEN: NEGATIVE

## 2016-05-22 LAB — POC OCCULT BLOOD, ED: FECAL OCCULT BLD: POSITIVE — AB

## 2016-05-22 LAB — ABO/RH: ABO/RH(D): B NEG

## 2016-05-22 MED ORDER — ACETAMINOPHEN 325 MG PO TABS: 650.0000 mg | ORAL_TABLET | Freq: Four times a day (QID) | ORAL | Status: DC | PRN

## 2016-05-22 MED ORDER — SODIUM CHLORIDE 0.9% FLUSH
3.0000 mL | Freq: Two times a day (BID) | INTRAVENOUS | Status: DC
  Administered 2016-05-23: 3 mL via INTRAVENOUS

## 2016-05-22 MED ORDER — ONDANSETRON HCL 4 MG PO TABS: 4.0000 mg | ORAL_TABLET | Freq: Four times a day (QID) | ORAL | Status: DC | PRN

## 2016-05-22 MED ORDER — ACETAMINOPHEN 650 MG RE SUPP: 650.0000 mg | Freq: Four times a day (QID) | RECTAL | Status: DC | PRN

## 2016-05-22 MED ORDER — IOPAMIDOL (ISOVUE-300) INJECTION 61%
INTRAVENOUS | Status: AC
  Filled 2016-05-22: qty 100

## 2016-05-22 MED ORDER — LORAZEPAM 2 MG/ML IJ SOLN: 1.0000 mg | Freq: Four times a day (QID) | INTRAMUSCULAR | Status: DC | PRN

## 2016-05-22 MED ORDER — IOPAMIDOL (ISOVUE-300) INJECTION 61%
100.0000 mL | Freq: Once | INTRAVENOUS | Status: AC | PRN
  Administered 2016-05-22: 100 mL via INTRAVENOUS

## 2016-05-22 MED ORDER — PANTOPRAZOLE SODIUM 40 MG PO TBEC: 40.0000 mg | DELAYED_RELEASE_TABLET | Freq: Every day | ORAL | Status: DC

## 2016-05-22 MED ORDER — PNEUMOCOCCAL VAC POLYVALENT 25 MCG/0.5ML IJ INJ
0.5000 mL | INJECTION | INTRAMUSCULAR | Status: DC
  Filled 2016-05-22: qty 0.5

## 2016-05-22 MED ORDER — LORAZEPAM 1 MG PO TABS: 1.0000 mg | ORAL_TABLET | Freq: Four times a day (QID) | ORAL | Status: DC | PRN

## 2016-05-22 MED ORDER — ONDANSETRON HCL 4 MG/2ML IJ SOLN: 4.0000 mg | Freq: Four times a day (QID) | INTRAMUSCULAR | Status: DC | PRN

## 2016-05-22 MED ORDER — SODIUM CHLORIDE 0.9 % IV SOLN
INTRAVENOUS | Status: AC
  Administered 2016-05-22: 23:00:00 via INTRAVENOUS

## 2016-05-22 NOTE — H&P (Signed)
History and Physical    Lee Krause IWP:[id number] DOB: Dec 01, 1965 DOA: 05/22/2016  PCP: Patient, No Pcp Per   Patient coming from: Home  Chief Complaint: Maroon stools  HPI: Lee Krause is a 51 y.o. gentleman with a history of tobacco and EtOH use, gastritis, and GERD who presents to the ED for evaluation of maroon stools.  The patient started having bloody bowel movements around 5AM.  This has been associated with LLQ pain.  He has had dizziness with bending over but no syncope.  No chest pain or shortness of breath.  No nausea or vomiting.  No hematemesis.  He has had intermittent BRBPR in the past, but symptoms have never been persistent of this severe.  No associated fever.  No history of EGD or colonoscopy.  No known history of diverticular disease.  No constipation.  No known history of ulcers.  ED Course: Rectal exam performed by the ED attending.  No obvious external hemorrhoids or other sources of bleeding identified.  Stool heme occult test is POSITIVE.  CT of the abdomen and pelvis obtained.  No acute findings to explain his symptoms.  Normal WBC count.  Hgb 13.7.  Random glucose 127.  CMP otherwise unremarkable.  Normal lipase.  Type and screen has been performed.  Hospitalist asked to place in observation.  Review of Systems: As per HPI otherwise 10 systems reviewed and negative.   Past Medical History:  Diagnosis Date  . Chest pain   . Gastritis   . GERD (gastroesophageal reflux disease)   . Tobacco abuse     Past Surgical History:  Procedure Laterality Date  . TOOTH EXTRACTION       reports that he has been smoking Cigarettes.  He has never used smokeless tobacco. He reports that he drinks alcohol. He reports that he does not use drugs.  He is married.  He has one biological son.  No Known Allergies  Family History  Problem Relation Age of Onset  . Hypertension Father   . Heart attack Brother      Prior to Admission medications   Medication Sig  Start Date End Date Taking? Authorizing Provider  ibuprofen (ADVIL,MOTRIN) 200 MG tablet Take 800 mg by mouth every 6 (six) hours as needed for headache or moderate pain.    Yes [provider]  Multiple Vitamin (MULTIVITAMIN WITH MINERALS) TABS tablet Take 1 tablet by mouth daily.   Yes [provider]    Physical Exam: Vitals:   05/22/16 1832 05/22/16 1900 05/22/16 1930 05/22/16 2159  BP: (!) 145/95 (!) 143/103 (!) 147/101 (!) 152/108  Pulse: 77 77 77 85  Resp: 12 10 10 17   Temp:      TempSrc:      SpO2: 98% 100% 99% 100%  Weight:      Height:          Constitutional: NAD, calm, comfortable, NONtoxic appearing Vitals:   05/22/16 1832 05/22/16 1900 05/22/16 1930 05/22/16 2159  BP: (!) 145/95 (!) 143/103 (!) 147/101 (!) 152/108  Pulse: 77 77 77 85  Resp: 12 10 10 17   Temp:      TempSrc:      SpO2: 98% 100% 99% 100%  Weight:      Height:       Eyes: PERRL, lids and conjunctivae normal ENMT: Mucous membranes are moist.  Normal dentition.  Neck: normal appearance, supple, no masses Respiratory: clear to auscultation bilaterally, no wheezing, no crackles. Normal respiratory effort. No accessory  muscle use.  Cardiovascular: Normal rate, regular rhythm, no murmurs / rubs / gallops. No extremity edema. 2+ pedal pulses. No carotid bruits.  GI: abdomen is soft and compressible.  No distention.  LLQ tenderness.  No guarding.  Bowel sounds are present. Musculoskeletal:  No joint deformity in upper and lower extremities. Good ROM, no contractures. Normal muscle tone.  Skin: no rashes, warm and dry Neurologic: CN 2-12 grossly intact. Sensation intact, Strength symmetric bilaterally. Psychiatric: Normal judgment and insight. Alert and oriented x 3. Normal mood.     Labs on Admission: I have personally reviewed following labs and imaging studies  CBC:  Recent Labs Lab 05/22/16 1750  WBC 8.8  HGB 13.7  HCT 38.8*  MCV 87.0  PLT 259   Basic Metabolic  Panel:  Recent Labs Lab 05/22/16 1750  NA 138  K 3.8  CL 107  CO2 22  GLUCOSE 127*  BUN 19  CREATININE 1.04  CALCIUM 9.2   GFR: Estimated Creatinine Clearance: 106.6 mL/min (by C-G formula based on SCr of 1.04 mg/dL). Liver Function Tests:  Recent Labs Lab 05/22/16 1750  AST 23  ALT 21  ALKPHOS 62  BILITOT 0.5  PROT 7.5  ALBUMIN 4.1    Recent Labs Lab 05/22/16 1750  LIPASE 39     Radiological Exams on Admission: Ct Abdomen Pelvis W Contrast  Result Date: 05/22/2016 CLINICAL DATA:  Left lower quadrant pain with bloody bowel movements. EXAM: CT ABDOMEN AND PELVIS WITH CONTRAST TECHNIQUE: Multidetector CT imaging of the abdomen and pelvis was performed using the standard protocol following bolus administration of intravenous contrast. CONTRAST:  117mL ISOVUE-300 IOPAMIDOL (ISOVUE-300) INJECTION 61% COMPARISON:  None. FINDINGS: Lower chest: No pulmonary nodules. No visible pleural or pericardial effusion. Hepatobiliary: Normal hepatic size and contours without focal liver lesion. No perihepatic ascites. No intra- or extrahepatic biliary dilatation. Normal gallbladder. Pancreas: Normal pancreatic contours and enhancement. No peripancreatic fluid collection or pancreatic ductal dilatation. Spleen: Normal. Adrenals/Urinary Tract: Normal adrenal glands. No hydronephrosis or solid renal mass. Stomach/Bowel: There is no hiatal hernia. The stomach and duodenum are normal. There is no dilated small bowel or enteric inflammation. There is no colonic abnormality. The appendix is normal. Vascular/Lymphatic: Normal course and caliber of the major abdominal vessels. No abdominal or pelvic adenopathy. Reproductive: Normal prostate. Musculoskeletal: No lytic or blastic osseous lesion. Normal visualized extrathoracic and extraperitoneal soft tissues. Other: No contributory non-categorized findings. IMPRESSION: No acute abnormality of the abdomen or pelvis. Electronically Signed   By: Ulyses Jarred  M.D.   On: 05/22/2016 20:44    Assessment/Plan Active Problems:   Tobacco use disorder   Lower GI bleed      Acute lower GI bleed --Telemetry monitoring --Check orthostatic vital signs --H/H q6h x 3, next at 2300.  Monitor for transfusion requirement. --CLD diet now then NPO after midnight --Unassigned GI will need to be called in the AM --Avoid anticoagulants, NSAIDs for now --Oral PPI (low suspicion of upper GI source based on presentation)  History of EtOH use but he denies dependence --CIWA protocol    DVT prophylaxis: Active bleeding, outpatient status Code Status: FULL Family Communication: Patient alone in the ED at time of admission. Disposition Plan: Expect he will go home when ready for discharge. Consults called: NONE.  Will need to call unassigned GI in the AM. Admission status: Place in observation with telemetry monitoring.   TIME SPENT: 50 minutes   Eber Jones MD [facility] Pager [phone]  If 7PM-7AM, please contact  night-coverage [email or web address] Password [email or web address]  05/22/2016, 10:08 PM

## 2016-05-22 NOTE — Discharge Instructions (Signed)
It is very important that you return to the Emergency Department immediately if you experience any worsening in your bleeding, bleeding does not improve tomorrow, you pass out, feel lightheaded or dizzy or have any new symptoms or additional concerns. Please have a very low threshold to return to the emergency department has GI bleeding can be a life-threatening emergency.  Continue taking omeprazole daily.  Call the GI clinic listed first thing Monday morning to schedule an appointment.

## 2016-05-22 NOTE — ED Triage Notes (Signed)
Patient reports 5-6 BM with blood in stool today. Patient has hx of the same in the past. Patient reports dizziness when bending over.

## 2016-05-22 NOTE — ED Provider Notes (Signed)
[facility] DEPT Provider Note   CSN: [id number] Arrival date & time: 05/22/16  1613     History   Chief Complaint Chief Complaint  Patient presents with  . Rectal Bleeding    HPI Lee Krause is a 51 y.o. male.  The history is provided by the patient and medical records. No language interpreter was used.  Rectal Bleeding  Associated symptoms: abdominal pain    Lee Krause is a 51 y.o. male  with a PMH of gastritis who presents to the Emergency Department complaining of 5-6 slightly loose BM's with dark maroon colored blood in it. Patient states this happens intermittently when he drinks heavily, however he has not drank more than usual this week. He typically will notice one or two blood-tinged stools, but never this many in a row. He also normally experiences upper epigastric pain and today it has been much more generalized, most significantly in LLQ. He has been taking omeprazole daily for several weeks now. No other medication taken prior to arrival for symptoms. No alleviating or aggravating factors noted. No n/v. No fevers, back pain, chest pain or shortness of breath. + daily drinker, typically drinker ~ 1/2 pint liquor daily.   Past Medical History:  Diagnosis Date  . Chest pain   . Gastritis   . Tobacco abuse     Patient Active Problem List   Diagnosis Date Noted  . Lower GI bleed 05/22/2016  . Family history of ischemic heart disease 11/18/2012  . Tobacco use disorder 11/18/2012    History reviewed. No pertinent surgical history.     Home Medications    Prior to Admission medications   Medication Sig Start Date End Date Taking? Authorizing Provider  ibuprofen (ADVIL,MOTRIN) 200 MG tablet Take 800 mg by mouth every 6 (six) hours as needed for headache or moderate pain.    Yes [provider]  Multiple Vitamin (MULTIVITAMIN WITH MINERALS) TABS tablet Take 1 tablet by mouth daily.   Yes [provider]    Family History Family  History  Problem Relation Age of Onset  . Hypertension Father   . Heart attack Brother     Social History Social History  Substance Use Topics  . Smoking status: Current Every Day Smoker    Types: Cigarettes  . Smokeless tobacco: Never Used  . Alcohol use Yes     Allergies   Patient has no known allergies.   Review of Systems Review of Systems  Gastrointestinal: Positive for abdominal pain, blood in stool and hematochezia.  All other systems reviewed and are negative.    Physical Exam Updated Vital Signs BP (!) 147/101   Pulse 77   Temp 98.1 F (36.7 C) (Oral)   Resp 10   Ht 6\' 4"  (1.93 m)   Wt 102.1 kg   SpO2 99%   BMI 27.39 kg/m   Physical Exam  Constitutional: He is oriented to person, place, and time. He appears well-developed and well-nourished. No distress.  HENT:  Head: Normocephalic and atraumatic.  Cardiovascular: Normal rate, regular rhythm and normal heart sounds.   No murmur heard. Pulmonary/Chest: Effort normal and breath sounds normal. No respiratory distress.  Abdominal: Soft. Bowel sounds are normal. He exhibits no distension. There is tenderness.  Generalized tenderness, however, focal area of tenderness to LLQ with guarding.  Genitourinary:  Genitourinary Comments: No gross blood noted on rectal exam, normal tone, no tenderness, no mass or fissure, no hemorrhoids noted.  Neurological: He is alert and oriented  to person, place, and time.  Skin: Skin is warm and dry.  Nursing note and vitals reviewed.    ED Treatments / Results  Labs (all labs ordered are listed, but only abnormal results are displayed) Labs Reviewed  COMPREHENSIVE METABOLIC PANEL - Abnormal; Notable for the following:       Result Value   Glucose, Bld 127 (*)    All other components within normal limits  CBC - Abnormal; Notable for the following:    HCT 38.8 (*)    All other components within normal limits  POC OCCULT BLOOD, ED - Abnormal; Notable for the following:     Fecal Occult Bld POSITIVE (*)    All other components within normal limits  LIPASE, BLOOD  TYPE AND SCREEN  ABO/RH    EKG  EKG Interpretation None       Radiology Ct Abdomen Pelvis W Contrast  Result Date: 05/22/2016 CLINICAL DATA:  Left lower quadrant pain with bloody bowel movements. EXAM: CT ABDOMEN AND PELVIS WITH CONTRAST TECHNIQUE: Multidetector CT imaging of the abdomen and pelvis was performed using the standard protocol following bolus administration of intravenous contrast. CONTRAST:  148mL ISOVUE-300 IOPAMIDOL (ISOVUE-300) INJECTION 61% COMPARISON:  None. FINDINGS: Lower chest: No pulmonary nodules. No visible pleural or pericardial effusion. Hepatobiliary: Normal hepatic size and contours without focal liver lesion. No perihepatic ascites. No intra- or extrahepatic biliary dilatation. Normal gallbladder. Pancreas: Normal pancreatic contours and enhancement. No peripancreatic fluid collection or pancreatic ductal dilatation. Spleen: Normal. Adrenals/Urinary Tract: Normal adrenal glands. No hydronephrosis or solid renal mass. Stomach/Bowel: There is no hiatal hernia. The stomach and duodenum are normal. There is no dilated small bowel or enteric inflammation. There is no colonic abnormality. The appendix is normal. Vascular/Lymphatic: Normal course and caliber of the major abdominal vessels. No abdominal or pelvic adenopathy. Reproductive: Normal prostate. Musculoskeletal: No lytic or blastic osseous lesion. Normal visualized extrathoracic and extraperitoneal soft tissues. Other: No contributory non-categorized findings. IMPRESSION: No acute abnormality of the abdomen or pelvis. Electronically Signed   By: Ulyses Jarred M.D.   On: 05/22/2016 20:44    Procedures Procedures (including critical care time)  Medications Ordered in ED Medications  iopamidol (ISOVUE-300) 61 % injection (not administered)  iopamidol (ISOVUE-300) 61 % injection 100 mL (100 mLs Intravenous Contrast  Given 05/22/16 2017)     Initial Impression / Assessment and Plan / ED Course  I have reviewed the triage vital signs and the nursing notes.  Pertinent labs & imaging results that were available during my care of the patient were reviewed by me and considered in my medical decision making (see chart for details).    Lee Krause is a 51 y.o. male who presents to ED for blood in stools. Hx of daily ETOH use and has intermittent had bleeding, however abdominal pain and bleeding today worse than it ever has been in the past. Never had endoscopy or colonoscopy. On exam, patient with normal heart rate. No hypotension. He does have generalized abdominal tenderness, but most significantly in the LLQ. + hemoccult. Normal hgb of 13.7. CT negative. Hospitalist consulted who will admit.    Final Clinical Impressions(s) / ED Diagnoses   Final diagnoses:  Gastrointestinal hemorrhage, unspecified gastrointestinal hemorrhage type    New Prescriptions New Prescriptions   No medications on file     Lenay Lovejoy, Ozella Almond, PA-C 05/22/16 2144    Tegeler, Gwenyth Allegra, MD 05/23/16 1324

## 2016-05-23 DIAGNOSIS — K921 Melena: Secondary | ICD-10-CM

## 2016-05-23 DIAGNOSIS — K922 Gastrointestinal hemorrhage, unspecified: Secondary | ICD-10-CM

## 2016-05-23 LAB — BASIC METABOLIC PANEL
ANION GAP: 8 (ref 5–15)
BUN: 15 mg/dL (ref 6–20)
CHLORIDE: 107 mmol/L (ref 101–111)
CO2: 24 mmol/L (ref 22–32)
Calcium: 8.8 mg/dL — ABNORMAL LOW (ref 8.9–10.3)
Creatinine, Ser: 1.08 mg/dL (ref 0.61–1.24)
GFR calc non Af Amer: >60 mL/min (ref >60)
Glucose, Bld: 109 mg/dL — ABNORMAL HIGH (ref 65–99)
POTASSIUM: 3.9 mmol/L (ref 3.5–5.1)
Sodium: 139 mmol/L (ref 135–145)

## 2016-05-23 LAB — PROTIME-INR
INR: 1.02
Prothrombin Time: 13.4 seconds (ref 11.4–15.2)

## 2016-05-23 LAB — HEMOGLOBIN AND HEMATOCRIT, BLOOD
HCT: 36.7 % — ABNORMAL LOW (ref 39.0–52.0)
HEMATOCRIT: 39.1 % (ref 39.0–52.0)
HEMOGLOBIN: 13.2 g/dL (ref 13.0–17.0)
HEMOGLOBIN: 13 g/dL (ref 13.0–17.0)

## 2016-05-23 MED ORDER — PANTOPRAZOLE SODIUM 40 MG PO TBEC: 40.0000 mg | DELAYED_RELEASE_TABLET | Freq: Every day | ORAL | 1 refills | Status: DC

## 2016-05-23 MED ORDER — DEXTROSE-NACL 5-0.9 % IV SOLN
INTRAVENOUS | Status: DC
  Administered 2016-05-23: 10:00:00 via INTRAVENOUS

## 2016-05-23 MED ORDER — PANTOPRAZOLE SODIUM 40 MG IV SOLR
40.0000 mg | Freq: Two times a day (BID) | INTRAVENOUS | Status: DC
  Administered 2016-05-23: 40 mg via INTRAVENOUS
  Filled 2016-05-23: qty 40

## 2016-05-23 NOTE — Progress Notes (Signed)
PROGRESS NOTE    Lee Krause  DUK:[id number] DOB: February 26, 1965 DOA: 05/22/2016 PCP: Patient, No Pcp Per    Brief Narrative: Lee Krause is a 51 y.o. gentleman with a history of tobacco and EtOH use, gastritis, and GERD who presents to the ED for evaluation of maroon stools.  The patient started having bloody bowel movements around 5AM.  This has been associated with LLQ pain.  He has had dizziness with bending over but no syncope.  No chest pain or shortness of breath.  No nausea or vomiting.  No hematemesis.  He has had intermittent BRBPR in the past, but symptoms have never been persistent of this severe.  No associated fever.  Assessment & Plan:   Active Problems:   Tobacco use disorder   Lower GI bleed  1-Acute GI bleed;  Report maroon stool with blood.  Hb stable at 13.  Continue with IV fluids.  IV protonix, history of ibuprofen intake.  GI consulted.   2-History of alcohol use; last drink more than 2 days. Ago. Denies prior history of withdrawal.     DVT prophylaxis: SCD.  Code Status: full code.  Family Communication: care discussed with family Disposition Plan: await GI evaluation.   Consultants:   GI  Procedures: None   Antimicrobials:   none   Subjective: Report left lower quadrant pain.  Report maroon stool with blood.  She has been taking ibuprofen after tooth extraction.    Objective: Vitals:   05/22/16 2159 05/22/16 2221 05/22/16 2247 05/23/16 0621  BP: (!) 152/108 (!) 158/101 (!) 144/89 (!) 136/98  Pulse: 85 76 73 78  Resp: 17 18 18 17   Temp:   98.7 F (37.1 C) 97.9 F (36.6 C)  TempSrc:   Oral Oral  SpO2: 100% 100% 100% 100%  Weight:   103 kg (227 lb 1.2 oz)   Height:   6\' 4"  (1.93 m)     Intake/Output Summary (Last 24 hours) at 05/23/16 0801 Last data filed at 05/23/16 0700  Gross per 24 hour  Intake          1061.67 ml  Output                0 ml  Net          1061.67 ml   Filed Weights   05/22/16 1724 05/22/16 2247    Weight: 102.1 kg (225 lb) 103 kg (227 lb 1.2 oz)    Examination:  General exam: Appears calm and comfortable  Respiratory system: Clear to auscultation. Respiratory effort normal. Cardiovascular system: S1 & S2 heard, RRR. No JVD, murmurs, rubs, gallops or clicks. No pedal edema. Gastrointestinal system: Abdomen is nondistended, soft and nontender. No organomegaly or masses felt. Normal bowel sounds heard. Central nervous system: Alert and oriented. No focal neurological deficits. Extremities: Symmetric 5 x 5 power. Skin: No rashes, lesions or ulcers Psychiatry: Judgement and insight appear normal. Mood & affect appropriate.     Data Reviewed: I have personally reviewed following labs and imaging studies  CBC:  Recent Labs Lab 05/22/16 1750 05/22/16 2305 05/23/16 0550  WBC 8.8  --   --   HGB 13.7 13.6 13.2  HCT 38.8* 39.3 39.1  MCV 87.0  --   --   PLT 283  --   --    Basic Metabolic Panel:  Recent Labs Lab 05/22/16 1750 05/23/16 0550  NA 138 139  K 3.8 3.9  CL 107 107  CO2 22 24  GLUCOSE 127* 109*  BUN 19 15  CREATININE 1.04 1.08  CALCIUM 9.2 8.8*   GFR: Estimated Creatinine Clearance: 102.7 mL/min (by C-G formula based on SCr of 1.08 mg/dL). Liver Function Tests:  Recent Labs Lab 05/22/16 1750  AST 23  ALT 21  ALKPHOS 62  BILITOT 0.5  PROT 7.5  ALBUMIN 4.1    Recent Labs Lab 05/22/16 1750  LIPASE 39   No results for input(s): AMMONIA in the last 168 hours. Coagulation Profile:  Recent Labs Lab 05/23/16 0550  INR 1.02   Cardiac Enzymes: No results for input(s): CKTOTAL, CKMB, CKMBINDEX, TROPONINI in the last 168 hours. BNP (last 3 results) No results for input(s): PROBNP in the last 8760 hours. HbA1C: No results for input(s): HGBA1C in the last 72 hours. CBG: No results for input(s): GLUCAP in the last 168 hours. Lipid Profile: No results for input(s): CHOL, HDL, LDLCALC, TRIG, CHOLHDL, LDLDIRECT in the last 72 hours. Thyroid  Function Tests: No results for input(s): TSH, T4TOTAL, FREET4, T3FREE, THYROIDAB in the last 72 hours. Anemia Panel: No results for input(s): VITAMINB12, FOLATE, FERRITIN, TIBC, IRON, RETICCTPCT in the last 72 hours. Sepsis Labs: No results for input(s): PROCALCITON, LATICACIDVEN in the last 168 hours.  No results found for this or any previous visit (from the past 240 hour(s)).       Radiology Studies: Ct Abdomen Pelvis W Contrast  Result Date: 05/22/2016 CLINICAL DATA:  Left lower quadrant pain with bloody bowel movements. EXAM: CT ABDOMEN AND PELVIS WITH CONTRAST TECHNIQUE: Multidetector CT imaging of the abdomen and pelvis was performed using the standard protocol following bolus administration of intravenous contrast. CONTRAST:  186mL ISOVUE-300 IOPAMIDOL (ISOVUE-300) INJECTION 61% COMPARISON:  None. FINDINGS: Lower chest: No pulmonary nodules. No visible pleural or pericardial effusion. Hepatobiliary: Normal hepatic size and contours without focal liver lesion. No perihepatic ascites. No intra- or extrahepatic biliary dilatation. Normal gallbladder. Pancreas: Normal pancreatic contours and enhancement. No peripancreatic fluid collection or pancreatic ductal dilatation. Spleen: Normal. Adrenals/Urinary Tract: Normal adrenal glands. No hydronephrosis or solid renal mass. Stomach/Bowel: There is no hiatal hernia. The stomach and duodenum are normal. There is no dilated small bowel or enteric inflammation. There is no colonic abnormality. The appendix is normal. Vascular/Lymphatic: Normal course and caliber of the major abdominal vessels. No abdominal or pelvic adenopathy. Reproductive: Normal prostate. Musculoskeletal: No lytic or blastic osseous lesion. Normal visualized extrathoracic and extraperitoneal soft tissues. Other: No contributory non-categorized findings. IMPRESSION: No acute abnormality of the abdomen or pelvis. Electronically Signed   By: Ulyses Jarred M.D.   On: 05/22/2016 20:44         Scheduled Meds: . pantoprazole  40 mg Oral Daily  . pneumococcal 23 valent vaccine  0.5 mL Intramuscular Tomorrow-1000  . sodium chloride flush  3 mL Intravenous Q12H   Continuous Infusions: . sodium chloride 100 mL/hr at 05/22/16 2247     LOS: 0 days    Time spent: 35 minutes.     Elmarie Shiley, MD [facility] Pager [phone]  If 7PM-7AM, please contact night-coverage [email or web address] Password [email or web address] 05/23/2016, 8:01 AM

## 2016-05-23 NOTE — Consult Note (Signed)
Referring Provider: [facility]  Primary Care Physician:  Patient, No Pcp Per Primary Gastroenterologist:  unassigned  Reason for Consultation:   Rectal bleeding   ASSESSMENT AND PLAN:   78. 51 yo male black male with hemodynamically stable GI bleed, blood dark red. BUN normal. Suspect lower origin of bleeding. No bleeding today and no blood in vault on DRE. His hgb is 13.2 which is around baseline. CTscan negative.  -patient would like to go home and his is stable from GI standpoint to do so. He agrees to call our office on Monday for follow up. Will arrange for outpatient colonoscopy at time of visit.  -recommended he avoid Ibuprofen, or at least use very sparingly until seen in office.  2. ETOH abuse. Drinking 1/2 liquor daily as of late. LFTs okay for now but I cautioned him against heavy drinking. He plans to reduce amount anyway as wife is intolerant of it.    HPI: Lee Krause is a 51 y.o. male who presented to ED yesterday with rectal bleeding and dizziness. Hemodynamically stable upon presentation. Hgb 13.2 with baseline of 13.7. BUN normal. CTscan unremarkable. Over the last year he has had episodes of rectal bleeding about 1-2 times a month. Yesterday he had 5-6 episodes of rectal bleeding (dark red blood). After so many episodes patient got concerned and came to ED.  He thinks the stool was dark but hard to tell with presence of maroon blood. No rectal pain or abdominal pain. No recent constipation. He was found to have LLQ tenderness in ED, hence the CTscan. He had a normal, non-bloody BM today. Patient has been taking Ibuprofen but only over last couple of weeks. His weight is stable. No Between of colon cancer.    Past Medical History:  Diagnosis Date  . Gastritis   . GERD (gastroesophageal reflux disease)   . Tobacco abuse     Past Surgical History:  Procedure Laterality Date  . TOOTH EXTRACTION      Prior to Admission medications   Medication Sig Start Date  End Date Taking? Authorizing Provider  ibuprofen (ADVIL,MOTRIN) 200 MG tablet Take 800 mg by mouth every 6 (six) hours as needed for headache or moderate pain.    Yes [provider]  Multiple Vitamin (MULTIVITAMIN WITH MINERALS) TABS tablet Take 1 tablet by mouth daily.   Yes [provider]    Current Facility-Administered Medications  Medication Dose Route Frequency Provider Last Rate Last Dose  . acetaminophen (TYLENOL) tablet 650 mg  650 mg Oral Q6H PRN Lily Kocher, MD       Or  . acetaminophen (TYLENOL) suppository 650 mg  650 mg Rectal Q6H PRN Lily Kocher, MD      . dextrose 5 %-0.9 % sodium chloride infusion   Intravenous Continuous Regalado, Belkys A, MD 100 mL/hr at 05/23/16 1010    . LORazepam (ATIVAN) tablet 1 mg  1 mg Oral Q6H PRN Lily Kocher, MD       Or  . LORazepam (ATIVAN) injection 1 mg  1 mg Intravenous Q6H PRN Lily Kocher, MD      . ondansetron [facility]) tablet 4 mg  4 mg Oral Q6H PRN Lily Kocher, MD       Or  . ondansetron [facility]) injection 4 mg  4 mg Intravenous Q6H PRN Lily Kocher, MD      . pantoprazole (PROTONIX) injection 40 mg  40 mg Intravenous Q12H Regalado, Belkys A, MD   40 mg at 05/23/16 1010  .  pneumococcal 23 valent vaccine (PNU-IMMUNE) injection 0.5 mL  0.5 mL Intramuscular Tomorrow-1000 Lily Kocher, MD      . sodium chloride flush (NS) 0.9 % injection 3 mL  3 mL Intravenous Q12H Lily Kocher, MD   3 mL at 05/23/16 1010    Allergies as of 05/22/2016  . (No Known Allergies)    Family History  Problem Relation Age of Onset  . Hypertension Father   . Heart attack Brother     Social History   Social History  . Marital status: Married    Spouse name: N/A  . Number of children: N/A  . Years of education: N/A   Occupational History  . Not on file.   Social History Main Topics  . Smoking status: Current Every Day Smoker    Types: Cigarettes  . Smokeless tobacco: Never Used  . Alcohol use Yes     Comment: Most  days; after work  . Drug use: No  . Sexual activity: Not on file   Other Topics Concern  . Not on file   Social History Narrative  . No narrative on file    Review of Systems: All systems reviewed and negative except where noted in HPI.  Physical Exam: Vital signs in last 24 hours: Temp:  [97.9 F (36.6 C)-98.7 F (37.1 C)] 97.9 F (36.6 C) (05/12 0621) Pulse Rate:  [73-91] 78 (05/12 0621) Resp:  [10-18] 17 (05/12 0621) BP: (136-158)/(89-108) 136/98 (05/12 0621) SpO2:  [98 %-100 %] 100 % (05/12 0621) Weight:  [225 lb (102.1 kg)-227 lb 1.2 oz (103 kg)] 227 lb 1.2 oz (103 kg) (05/11 2247) Last BM Date: 05/23/16 (early morning prior to shift) General:   Alert, well-developed,  Black male in NAD Psych:  Pleasant, cooperative. Normal mood and affect. Eyes:  Pupils equal, sclera clear, no icterus.   Conjunctiva pink. Ears:  Normal auditory acuity. Nose:  No deformity, discharge,  or lesions. Neck:  Supple; no masses Lungs:  Clear throughout to auscultation.   No wheezes, crackles, or rhonchi.  Heart:  Regular rate and rhythm; no murmurs, no edema Abdomen:  Soft, non-distended, nontender, BS active, no palp mass    Rectal:  No external lesions. Scant light brown residual fecal material in vault.  Msk:  Symmetrical without gross deformities. . Neurologic:  Alert and  oriented x4;  grossly normal neurologically. Skin:  Intact without significant lesions or rashes..   Intake/Output from previous day: 05/11 0701 - 05/12 0700 In: 1061.7 [P.O.:240; I.V.:821.7] Out: -  Intake/Output this shift: No intake/output data recorded.  Lab Results:  Recent Labs  05/22/16 1750 05/22/16 2305 05/23/16 0550  WBC 8.8  --   --   HGB 13.7 13.6 13.2  HCT 38.8* 39.3 39.1  PLT 283  --   --    BMET  Recent Labs  05/22/16 1750 05/23/16 0550  NA 138 139  K 3.8 3.9  CL 107 107  CO2 22 24  GLUCOSE 127* 109*  BUN 19 15  CREATININE 1.04 1.08  CALCIUM 9.2 8.8*   LFT  Recent Labs   05/22/16 1750  PROT 7.5  ALBUMIN 4.1  AST 23  ALT 21  ALKPHOS 62  BILITOT 0.5   PT/INR  Recent Labs  05/23/16 0550  LABPROT 13.4  INR 1.02   Hepatitis Panel No results for input(s): HEPBSAG, HCVAB, HEPAIGM, HEPBIGM in the last 72 hours.  Studies/Results: Ct Abdomen Pelvis W Contrast  Result Date: 05/22/2016 CLINICAL DATA:  Left lower quadrant  pain with bloody bowel movements. EXAM: CT ABDOMEN AND PELVIS WITH CONTRAST TECHNIQUE: Multidetector CT imaging of the abdomen and pelvis was performed using the standard protocol following bolus administration of intravenous contrast. CONTRAST:  138mL ISOVUE-300 IOPAMIDOL (ISOVUE-300) INJECTION 61% COMPARISON:  None. FINDINGS: Lower chest: No pulmonary nodules. No visible pleural or pericardial effusion. Hepatobiliary: Normal hepatic size and contours without focal liver lesion. No perihepatic ascites. No intra- or extrahepatic biliary dilatation. Normal gallbladder. Pancreas: Normal pancreatic contours and enhancement. No peripancreatic fluid collection or pancreatic ductal dilatation. Spleen: Normal. Adrenals/Urinary Tract: Normal adrenal glands. No hydronephrosis or solid renal mass. Stomach/Bowel: There is no hiatal hernia. The stomach and duodenum are normal. There is no dilated small bowel or enteric inflammation. There is no colonic abnormality. The appendix is normal. Vascular/Lymphatic: Normal course and caliber of the major abdominal vessels. No abdominal or pelvic adenopathy. Reproductive: Normal prostate. Musculoskeletal: No lytic or blastic osseous lesion. Normal visualized extrathoracic and extraperitoneal soft tissues. Other: No contributory non-categorized findings. IMPRESSION: No acute abnormality of the abdomen or pelvis. Electronically Signed   By: Ulyses Jarred M.D.   On: 05/22/2016 20:44    Tye Savoy, NP-C @  05/23/2016, 11:17 AM Pager number [phone]  GI ATTENDING  History, laboratories, x-rays reviewed.  Comprehensive consultation note by GI nurse practitioner reviewed. Agree with assessment and plans for outpatient evaluation.  Docia Chuck. Geri [place]in[place]., M.D. [facility] Division of Gastroenterology

## 2016-05-23 NOTE — Discharge Summary (Signed)
Physician Discharge Summary  Lee Krause IPJ:[id number] DOB: 18-Apr-1965 DOA: 05/22/2016  PCP: Patient, No Pcp Per  Admit date: 05/22/2016 Discharge date: 05/23/2016  Admitted From: Home  Disposition:  Home   Recommendations for Outpatient Follow-up:  1. Follow up with PCP in 1-2 weeks 2. Please obtain BMP/CBC in one week 3. Follow with GI for colonoscopy    Discharge Condition: Stable.  CODE STATUS: full code.  Diet recommendation: Heart Healthy  Brief/Interim Summary: Lee Krause a 51 y.o.gentleman with a history of tobacco and EtOH use, gastritis, and GERD who presents to the ED for evaluation of maroon stools. The patient started having bloody bowel movements around 5AM. This has been associated with LLQ pain. He has had dizziness with bending over but no syncope. No chest pain or shortness of breath. No nausea or vomiting. No hematemesis. He has had intermittent BRBPR in the past, but symptoms have never been persistent of this severe. No associated fever.  Assessment & Plan:   Active Problems:   Tobacco use disorder   Lower GI bleed  1-Acute GI bleed;  Report maroon stool with blood.  Hb stable at 13.  protonix, with  history of ibuprofen intake.  GI consulted. Recommend outpatient follow up. Patient stable for discharge per gi.   2-History of alcohol use; last drink more than 2 days. Ago. Denies prior history of withdrawal.      Discharge Diagnoses:  Active Problems:   Tobacco use disorder   Lower GI bleed    Discharge Instructions  Discharge Instructions    Diet - low sodium heart healthy    Complete by:  As directed    Increase activity slowly    Complete by:  As directed      Allergies as of 05/23/2016   No Known Allergies     Medication List    STOP taking these medications   ibuprofen 200 MG tablet Commonly known as:  ADVIL,MOTRIN     TAKE these medications   multivitamin with minerals Tabs tablet Take 1 tablet by  mouth daily.   pantoprazole 40 MG tablet Commonly known as:  PROTONIX Take 1 tablet (40 mg total) by mouth daily.      Follow-up Information    [facility].   Contact information: [place] [address] [phone]          No Known Allergies  Consultations:  GI   Procedures/Studies: Ct Abdomen Pelvis W Contrast  Result Date: 05/22/2016 CLINICAL DATA:  Left lower quadrant pain with bloody bowel movements. EXAM: CT ABDOMEN AND PELVIS WITH CONTRAST TECHNIQUE: Multidetector CT imaging of the abdomen and pelvis was performed using the standard protocol following bolus administration of intravenous contrast. CONTRAST:  19mL ISOVUE-300 IOPAMIDOL (ISOVUE-300) INJECTION 61% COMPARISON:  None. FINDINGS: Lower chest: No pulmonary nodules. No visible pleural or pericardial effusion. Hepatobiliary: Normal hepatic size and contours without focal liver lesion. No perihepatic ascites. No intra- or extrahepatic biliary dilatation. Normal gallbladder. Pancreas: Normal pancreatic contours and enhancement. No peripancreatic fluid collection or pancreatic ductal dilatation. Spleen: Normal. Adrenals/Urinary Tract: Normal adrenal glands. No hydronephrosis or solid renal mass. Stomach/Bowel: There is no hiatal hernia. The stomach and duodenum are normal. There is no dilated small bowel or enteric inflammation. There is no colonic abnormality. The appendix is normal. Vascular/Lymphatic: Normal course and caliber of the major abdominal vessels. No abdominal or pelvic adenopathy. Reproductive: Normal prostate. Musculoskeletal: No lytic or blastic osseous lesion. Normal visualized extrathoracic  and extraperitoneal soft tissues. Other: No contributory non-categorized findings. IMPRESSION: No acute abnormality of the abdomen or pelvis. Electronically Signed   By: Ulyses Jarred M.D.   On: 05/22/2016 20:44     Subjective: He is feeling better, no blood in the stool  today   Discharge Exam: Vitals:   05/22/16 2247 05/23/16 0621  BP: (!) 144/89 (!) 136/98  Pulse: 73 78  Resp: 18 17  Temp: 98.7 F (37.1 C) 97.9 F (36.6 C)   Vitals:   05/22/16 2159 05/22/16 2221 05/22/16 2247 05/23/16 0621  BP: (!) 152/108 (!) 158/101 (!) 144/89 (!) 136/98  Pulse: 85 76 73 78  Resp: 17 18 18 17   Temp:   98.7 F (37.1 C) 97.9 F (36.6 C)  TempSrc:   Oral Oral  SpO2: 100% 100% 100% 100%  Weight:   103 kg (227 lb 1.2 oz)   Height:   6\' 4"  (1.93 m)     General: Pt is alert, awake, not in acute distress Cardiovascular: RRR, S1/S2 +, no rubs, no gallops Respiratory: CTA bilaterally, no wheezing, no rhonchi Abdominal: Soft, NT, ND, bowel sounds + Extremities: no edema, no cyanosis    The results of significant diagnostics from this hospitalization (including imaging, microbiology, ancillary and laboratory) are listed below for reference.     Microbiology: No results found for this or any previous visit (from the past 240 hour(s)).   Labs: BNP (last 3 results) No results for input(s): BNP in the last 8760 hours. Basic Metabolic Panel:  Recent Labs Lab 05/22/16 1750 05/23/16 0550  NA 138 139  K 3.8 3.9  CL 107 107  CO2 22 24  GLUCOSE 127* 109*  BUN 19 15  CREATININE 1.04 1.08  CALCIUM 9.2 8.8*   Liver Function Tests:  Recent Labs Lab 05/22/16 1750  AST 23  ALT 21  ALKPHOS 62  BILITOT 0.5  PROT 7.5  ALBUMIN 4.1    Recent Labs Lab 05/22/16 1750  LIPASE 39   No results for input(s): AMMONIA in the last 168 hours. CBC:  Recent Labs Lab 05/22/16 1750 05/22/16 2305 05/23/16 0550 05/23/16 1132  WBC 8.8  --   --   --   HGB 13.7 13.6 13.2 13.0  HCT 38.8* 39.3 39.1 36.7*  MCV 87.0  --   --   --   PLT 283  --   --   --    Cardiac Enzymes: No results for input(s): CKTOTAL, CKMB, CKMBINDEX, TROPONINI in the last 168 hours. BNP: Invalid input(s): POCBNP CBG: No results for input(s): GLUCAP in the last 168 hours. D-Dimer No  results for input(s): DDIMER in the last 72 hours. Hgb A1c No results for input(s): HGBA1C in the last 72 hours. Lipid Profile No results for input(s): CHOL, HDL, LDLCALC, TRIG, CHOLHDL, LDLDIRECT in the last 72 hours. Thyroid function studies No results for input(s): TSH, T4TOTAL, T3FREE, THYROIDAB in the last 72 hours.  Invalid input(s): FREET3 Anemia work up No results for input(s): VITAMINB12, FOLATE, FERRITIN, TIBC, IRON, RETICCTPCT in the last 72 hours. Urinalysis No results found for: COLORURINE, APPEARANCEUR, LABSPEC, [place], GLUCOSEU, HGBUR, BILIRUBINUR, KETONESUR, PROTEINUR, UROBILINOGEN, NITRITE, LEUKOCYTESUR Sepsis Labs Invalid input(s): PROCALCITONIN,  WBC,  LACTICIDVEN Microbiology No results found for this or any previous visit (from the past 240 hour(s)).   Time coordinating discharge: Over 30 minutes  SIGNED:   Elmarie Shiley, MD  [facility] 05/23/2016, 12:25 PM Pager [phone]  If 7PM-7AM, please contact night-coverage [email or web address] Password [email or web address]

## 2016-05-25 ENCOUNTER — Encounter: Payer: Self-pay | Admitting: Nurse Practitioner

## 2016-06-02 ENCOUNTER — Other Ambulatory Visit (INDEPENDENT_AMBULATORY_CARE_PROVIDER_SITE_OTHER): Payer: Managed Care, Other (non HMO)

## 2016-06-02 ENCOUNTER — Encounter: Payer: Self-pay | Admitting: Nurse Practitioner

## 2016-06-02 ENCOUNTER — Ambulatory Visit (INDEPENDENT_AMBULATORY_CARE_PROVIDER_SITE_OTHER): Payer: Managed Care, Other (non HMO) | Admitting: Nurse Practitioner

## 2016-06-02 VITALS — BP 118/76 | HR 72 | Ht 76.0 in | Wt 225.4 lb

## 2016-06-02 DIAGNOSIS — F1010 Alcohol abuse, uncomplicated: Secondary | ICD-10-CM | POA: Diagnosis not present

## 2016-06-02 DIAGNOSIS — K922 Gastrointestinal hemorrhage, unspecified: Secondary | ICD-10-CM | POA: Diagnosis not present

## 2016-06-02 LAB — CBC
HEMATOCRIT: 41.3 % (ref 39.0–52.0)
Hemoglobin: 14 g/dL (ref 13.0–17.0)
MCHC: 33.9 g/dL (ref 30.0–36.0)
MCV: 88.9 fl (ref 78.0–100.0)
Platelets: 342 10*3/uL (ref 150.0–400.0)
RBC: 4.64 Mil/uL (ref 4.22–5.81)
RDW: 12.9 % (ref 11.5–15.5)
WBC: 10.9 10*3/uL — ABNORMAL HIGH (ref 4.0–10.5)

## 2016-06-02 MED ORDER — NA SULFATE-K SULFATE-MG SULF 17.5-3.13-1.6 GM/177ML PO SOLN: 1.0000 | Freq: Once | ORAL | 0 refills | Status: AC

## 2016-06-02 NOTE — Progress Notes (Signed)
     HPI: Patient is a 51 yo male who was recently hospitalized with a GI bleed. He presented with hematoochezia (maroon stools) at home. His hgb remained at baseline around 13. BUN was normal. Patient had been taking Ibuprofen at home. He has a long hx of heavy ETOH use but there was no evidence for liver disease. Patient was discharged home with plans for outpatient GI workup.   Patient has had no further bleeding. He has no nausea, vomiting, or abdominal pain. He has not had any in NSAIDS since hospital discharge. He is drinking only 1 beer a day now    Past Medical History:  Diagnosis Date  . Chest pain   . Gastritis   . GERD (gastroesophageal reflux disease)   . Tobacco abuse     Patient's surgical history, family medical history, social history, medications and allergies were all reviewed in Epic    Physical Exam: BP 118/76   Pulse 72   Ht 6\' 4"  (1.93 m)   Wt 225 lb 6 oz (102.2 kg)   BMI 27.43 kg/m   GENERAL: pleasant well developed black male in NAD PSYCH: :Pleasant, cooperative, normal affect EENT:  conjunctiva pink, mucous membranes moist, neck supple without masses CARDIAC:  RRR, no murmur heard, no peripheral edema PULM: Normal respiratory effort, lungs CTA bilaterally, no wheezing ABDOMEN:  soft, nontender, nondistended, no obvious masses, no hepatomegaly,  normal bowel sounds SKIN:  turgor, no lesions seen Musculoskeletal:  Normal muscle tone, normal strength NEURO: Alert and oriented x 3, no focal neurologic deficits  ASSESSMENT and PLAN:  1. 51 yo male with recent admission for GI bleed (maroon stool). Hgb remained at baseline around 13 so bleed obviously low volume. He had been taking NSAIDs No further bleeding. He is off NSAIDs.  -For further evaluation patient will be scheduled for an EGD and colonoscopy.   The risks and benefits of both procedures were discussed and the patient agrees to proceed.  -No NSAIDS for now -CBC  2. ETOH abuse. He is drinking  only one beer a day now. Normal LFTs.    Tye Savoy , NP 06/02/2016, 2:21 PM

## 2016-06-02 NOTE — Patient Instructions (Addendum)
If you are age 49 or older, your body mass index should be between 23-30. Your Body mass index is 27.43 kg/m. If this is out of the aforementioned range listed, please consider follow up with your Primary Care Provider.  If you are age 35 or younger, your body mass index should be between 19-25. Your Body mass index is 27.43 kg/m. If this is out of the aformentioned range listed, please consider follow up with your Primary Care Provider.   You have been scheduled for an endoscopy and colonoscopy. Please follow the written instructions given to you at your visit today. Please pick up your prep supplies at the pharmacy within the next 1-3 days. If you use inhalers (even only as needed), please bring them with you on the day of your procedure. Your physician has requested that you go to www.startemmi.com and enter the access code given to you at your visit today. This web site gives a general overview about your procedure. However, you should still follow specific instructions given to you by our office regarding your preparation for the procedure.  Your physician has requested that you go to the basement for the following lab work before leaving today: CBC  Continue Pantoprazole daily.  Thank you for choosing me and [facility]. Tye Savoy, NP

## 2016-06-04 NOTE — Progress Notes (Signed)
Agree with assessment and plans 

## 2016-07-23 ENCOUNTER — Telehealth: Payer: Self-pay | Admitting: Internal Medicine

## 2016-07-24 ENCOUNTER — Encounter: Payer: Managed Care, Other (non HMO) | Admitting: Internal Medicine

## 2016-07-24 NOTE — Telephone Encounter (Signed)
No charge. 

## 2017-04-05 ENCOUNTER — Observation Stay (HOSPITAL_COMMUNITY)
Admission: EM | Admit: 2017-04-05 | Discharge: 2017-04-06 | Disposition: A | Discharge status: Home / Self Care | Payer: Managed Care, Other (non HMO) | Attending: Internal Medicine | Admitting: Internal Medicine

## 2017-04-05 ENCOUNTER — Encounter (HOSPITAL_COMMUNITY): Payer: Self-pay

## 2017-04-05 ENCOUNTER — Other Ambulatory Visit: Payer: Self-pay

## 2017-04-05 ENCOUNTER — Emergency Department (HOSPITAL_COMMUNITY): Payer: Managed Care, Other (non HMO)

## 2017-04-05 ENCOUNTER — Observation Stay (HOSPITAL_BASED_OUTPATIENT_CLINIC_OR_DEPARTMENT_OTHER): Payer: Managed Care, Other (non HMO)

## 2017-04-05 DIAGNOSIS — M79645 Pain in left finger(s): Secondary | ICD-10-CM | POA: Insufficient documentation

## 2017-04-05 DIAGNOSIS — R0789 Other chest pain: Secondary | ICD-10-CM | POA: Diagnosis not present

## 2017-04-05 DIAGNOSIS — I10 Essential (primary) hypertension: Secondary | ICD-10-CM | POA: Diagnosis not present

## 2017-04-05 DIAGNOSIS — K219 Gastro-esophageal reflux disease without esophagitis: Secondary | ICD-10-CM | POA: Insufficient documentation

## 2017-04-05 DIAGNOSIS — R072 Precordial pain: Secondary | ICD-10-CM

## 2017-04-05 DIAGNOSIS — Z8249 Family history of ischemic heart disease and other diseases of the circulatory system: Secondary | ICD-10-CM | POA: Diagnosis not present

## 2017-04-05 DIAGNOSIS — E785 Hyperlipidemia, unspecified: Secondary | ICD-10-CM | POA: Insufficient documentation

## 2017-04-05 DIAGNOSIS — Z7982 Long term (current) use of aspirin: Secondary | ICD-10-CM | POA: Insufficient documentation

## 2017-04-05 DIAGNOSIS — R0602 Shortness of breath: Secondary | ICD-10-CM | POA: Diagnosis not present

## 2017-04-05 DIAGNOSIS — Z79899 Other long term (current) drug therapy: Secondary | ICD-10-CM | POA: Diagnosis not present

## 2017-04-05 DIAGNOSIS — F17210 Nicotine dependence, cigarettes, uncomplicated: Secondary | ICD-10-CM | POA: Diagnosis not present

## 2017-04-05 LAB — BASIC METABOLIC PANEL
Anion gap: 9 (ref 5–15)
BUN: 16 mg/dL (ref 6–20)
CHLORIDE: 107 mmol/L (ref 101–111)
CO2: 24 mmol/L (ref 22–32)
CREATININE: 1.07 mg/dL (ref 0.61–1.24)
Calcium: 9.2 mg/dL (ref 8.9–10.3)
GFR calc Af Amer: >60 mL/min (ref >60)
GFR calc non Af Amer: >60 mL/min (ref >60)
GLUCOSE: 100 mg/dL — AB (ref 65–99)
Potassium: 3.7 mmol/L (ref 3.5–5.1)
Sodium: 140 mmol/L (ref 135–145)

## 2017-04-05 LAB — CBC
HCT: 39.9 % (ref 39.0–52.0)
Hemoglobin: 13.5 g/dL (ref 13.0–17.0)
MCH: 30.4 pg (ref 26.0–34.0)
MCHC: 33.8 g/dL (ref 30.0–36.0)
MCV: 89.9 fL (ref 78.0–100.0)
PLATELETS: 305 10*3/uL (ref 150–400)
RBC: 4.44 MIL/uL (ref 4.22–5.81)
RDW: 12.7 % (ref 11.5–15.5)
WBC: 9.1 10*3/uL (ref 4.0–10.5)

## 2017-04-05 LAB — ECHOCARDIOGRAM COMPLETE
Height: 76 in
Weight: 3680 oz

## 2017-04-05 LAB — TROPONIN I

## 2017-04-05 LAB — I-STAT TROPONIN, ED
TROPONIN I, POC: 0 ng/mL (ref 0.00–0.08)
Troponin i, poc: 0.01 ng/mL (ref 0.00–0.08)

## 2017-04-05 MED ORDER — POLYETHYLENE GLYCOL 3350 17 G PO PACK: 17.0000 g | PACK | Freq: Every day | ORAL | Status: DC | PRN

## 2017-04-05 MED ORDER — ASPIRIN EC 81 MG PO TBEC
162.0000 mg | DELAYED_RELEASE_TABLET | Freq: Once | ORAL | Status: AC
Start: 2017-04-05 — End: 2017-04-05
  Administered 2017-04-05: 162 mg via ORAL
  Filled 2017-04-05: qty 2

## 2017-04-05 MED ORDER — SODIUM CHLORIDE 0.9% FLUSH
3.0000 mL | Freq: Two times a day (BID) | INTRAVENOUS | Status: DC
  Administered 2017-04-05 – 2017-04-06 (×2): 3 mL via INTRAVENOUS

## 2017-04-05 MED ORDER — ACETAMINOPHEN 650 MG RE SUPP: 650.0000 mg | Freq: Four times a day (QID) | RECTAL | Status: DC | PRN

## 2017-04-05 MED ORDER — PANTOPRAZOLE SODIUM 40 MG PO TBEC
40.0000 mg | DELAYED_RELEASE_TABLET | Freq: Every day | ORAL | Status: DC
  Administered 2017-04-05 – 2017-04-06 (×2): 40 mg via ORAL
  Filled 2017-04-05 (×2): qty 1

## 2017-04-05 MED ORDER — ACETAMINOPHEN 325 MG PO TABS: 650.0000 mg | ORAL_TABLET | Freq: Four times a day (QID) | ORAL | Status: DC | PRN

## 2017-04-05 MED ORDER — SODIUM CHLORIDE 0.9 % IV SOLN: 250.0000 mL | INTRAVENOUS | Status: DC | PRN

## 2017-04-05 MED ORDER — SODIUM CHLORIDE 0.9% FLUSH: 3.0000 mL | INTRAVENOUS | Status: DC | PRN

## 2017-04-05 NOTE — ED Triage Notes (Addendum)
Patient c/o intermittent mid chest pain/pressure and states it"it takes my breath." since 0530 today. Patient states he took ASA 81 mg at 0700 today. patiaent states he tripped and fell on his left thumb last week. Patient placed a thumb support brace and has been wearing, but is still having pain.

## 2017-04-05 NOTE — H&P (Signed)
History and Physical    Lee Krause VQM:[id number] DOB: 1965/05/15 DOA: 04/05/2017  PCP: Patient, No Pcp Per  Patient coming from: home  Chief Complaint: Chest pain  HPI: Lee Krause is a 52 y.o. male with medical history significant of hypertension, hyperlipidemia and a brother who had a heart attack at age 1 comes in for atypical chest pain, he relates he had his chest pain when he was walking got better with rest, he relates he has had this in the past while he was sitting down.  But this time it was different as he has had it with shortness of breath and diaphoresis, he related lasted about 15 minutes then it went away.  ED Course:  Cardiac biomarkers were negative EKG shows as below  Review of Systems: As per HPI otherwise 10 point review of systems negative.    Past Medical History:  Diagnosis Date  . Chest pain   . Gastritis   . GERD (gastroesophageal reflux disease)   . Tobacco abuse     Past Surgical History:  Procedure Laterality Date  . TOOTH EXTRACTION       reports that he has been smoking cigarettes.  He has been smoking about 0.25 packs per day. He has never used smokeless tobacco. He reports that he drinks alcohol. He reports that he does not use drugs.  No Known Allergies  Family History  Problem Relation Age of Onset  . Hypertension Father   . Heart attack Brother      Prior to Admission medications   Medication Sig Start Date End Date Taking? Authorizing Provider  aspirin EC 81 MG tablet Take 81 mg by mouth daily.   Yes [provider]  Multiple Vitamin (MULTIVITAMIN WITH MINERALS) TABS tablet Take 1 tablet by mouth daily.   Yes [provider]  pantoprazole (PROTONIX) 40 MG tablet Take 1 tablet (40 mg total) by mouth daily. 05/23/16 05/23/17 Yes Regalado, Cassie Freer, MD    Physical Exam: Vitals:   04/05/17 1057 04/05/17 1058 04/05/17 1345  BP: (!) 139/97  (!) 156/99  Pulse: 81  86  Resp: 14  (!) 22  Temp: 98.5 F (36.9  C)    TempSrc: Oral    SpO2: 99%  100%  Weight:  104.3 kg (230 lb)   Height:  6\' 4"  (1.93 m)     Constitutional: NAD, calm, comfortable Vitals:   04/05/17 1057 04/05/17 1058 04/05/17 1345  BP: (!) 139/97  (!) 156/99  Pulse: 81  86  Resp: 14  (!) 22  Temp: 98.5 F (36.9 C)    TempSrc: Oral    SpO2: 99%  100%  Weight:  104.3 kg (230 lb)   Height:  6\' 4"  (1.93 m)    Eyes: PERRL, lids and conjunctivae normal ENMT: Mucous membranes are moist. Posterior pharynx clear of any exudate or lesions.Normal dentition.  Neck: normal, supple, no masses, no thyromegaly Respiratory: Good air movement and clear to auscultation Cardiovascular: Rate and rhythm with no murmurs rubs gallops no lower extremity edema Abdomen: Some very mild epigastric tenderness no masses palpated. No hepatosplenomegaly. Bowel sounds positive.  Musculoskeletal: no clubbing / cyanosis. No joint deformity upper and lower extremities. Good ROM, no contractures. Normal muscle tone.  Skin: no rashes, lesions, ulcers. No induration Neurologic: CN 2-12 grossly intact. Sensation intact, DTR normal. Strength 5/5 in all 4.  Psychiatric: Normal judgment and insight. Alert and oriented x 3. Normal mood.    Labs on Admission: I have  personally reviewed following labs and imaging studies  CBC: Recent Labs  Lab 04/05/17 1145  WBC 9.1  HGB 13.5  HCT 39.9  MCV 89.9  PLT 267   Basic Metabolic Panel: Recent Labs  Lab 04/05/17 1145  NA 140  K 3.7  CL 107  CO2 24  GLUCOSE 100*  BUN 16  CREATININE 1.07  CALCIUM 9.2   GFR: Estimated Creatinine Clearance: 110.8 mL/min (by C-G formula based on SCr of 1.07 mg/dL). Liver Function Tests: No results for input(s): AST, ALT, ALKPHOS, BILITOT, PROT, ALBUMIN in the last 168 hours. No results for input(s): LIPASE, AMYLASE in the last 168 hours. No results for input(s): AMMONIA in the last 168 hours. Coagulation Profile: No results for input(s): INR, PROTIME in the last 168  hours. Cardiac Enzymes: No results for input(s): CKTOTAL, CKMB, CKMBINDEX, TROPONINI in the last 168 hours. BNP (last 3 results) No results for input(s): PROBNP in the last 8760 hours. HbA1C: No results for input(s): HGBA1C in the last 72 hours. CBG: No results for input(s): GLUCAP in the last 168 hours. Lipid Profile: No results for input(s): CHOL, HDL, LDLCALC, TRIG, CHOLHDL, LDLDIRECT in the last 72 hours. Thyroid Function Tests: No results for input(s): TSH, T4TOTAL, FREET4, T3FREE, THYROIDAB in the last 72 hours. Anemia Panel: No results for input(s): VITAMINB12, FOLATE, FERRITIN, TIBC, IRON, RETICCTPCT in the last 72 hours. Urine analysis: No results found for: COLORURINE, APPEARANCEUR, LABSPEC, PHURINE, GLUCOSEU, HGBUR, BILIRUBINUR, KETONESUR, PROTEINUR, UROBILINOGEN, NITRITE, LEUKOCYTESUR  Radiological Exams on Admission: Dg Chest 2 View  Result Date: 04/05/2017 CLINICAL DATA:  Chest pain. EXAM: CHEST - 2 VIEW COMPARISON:  Radiographs of November 03, 2012 FINDINGS: The heart size and mediastinal contours are within normal limits. Both lungs are clear. No pneumothorax or pleural effusion is noted. The visualized skeletal structures are unremarkable. IMPRESSION: No active cardiopulmonary disease. Electronically Signed   By: Marijo Conception, M.D.   On: 04/05/2017 12:38   Dg Wrist Complete Left  Result Date: 04/05/2017 CLINICAL DATA:  Left wrist pain after fall 2 weeks ago. EXAM: LEFT WRIST - COMPLETE 3+ VIEW COMPARISON:  None. FINDINGS: There is no evidence of fracture or dislocation. There is no evidence of arthropathy or other focal bone abnormality. Soft tissues are unremarkable. IMPRESSION: Normal left wrist. Electronically Signed   By: Marijo Conception, M.D.   On: 04/05/2017 12:42   Dg Finger Thumb Left  Result Date: 04/05/2017 CLINICAL DATA:  Left thumb pain after fall 2 weeks ago. EXAM: LEFT THUMB 2+V COMPARISON:  None. FINDINGS: Small bone fragment is seen dorsal to proximal  base of first distal phalanx consistent with avulsion fracture of indeterminate age. No other bony abnormality is noted. No joint space abnormality is noted. No soft tissue abnormality is noted. IMPRESSION: Small bone fragment seen dorsal to proximal base of first distal phalanx concerning for avulsion fracture of indeterminate age. Electronically Signed   By: Marijo Conception, M.D.   On: 04/05/2017 12:40    EKG: Independently reviewed.  Normal sinus rhythm normal axis LVH nonspecific T wave changes change from previous.  Assessment/Plan Atypical chest pain With a heart score of 4, will admit to the hospital cycle cardiac biomarkers monitor on telemetry check a 2D echo. We will continue aspirin Consult cardiology for further evaluation as he probably will need a stress test as an outpatient. Continue aspirin Protonix.  Essential hypertension He is on no antihypertensive medication, will start him on hydrochlorothiazide daily.  DVT prophylaxis: Early ambulation Code  Status: full Family Communication: wife Disposition Plan: home in am  Consults called: cardiology Admission status: observation   Charlynne Cousins MD [facility] Pager [phone]  If 7PM-7AM, please contact night-coverage [email or web address] Password [facility]  04/05/2017, 2:58 PM

## 2017-04-05 NOTE — ED Notes (Signed)
Bed: WA09 Expected date:  Expected time:  Means of arrival:  Comments: 

## 2017-04-05 NOTE — Progress Notes (Signed)
  Echocardiogram 2D Echocardiogram has been performed.  Lee Krause 04/05/2017, 4:33 PM

## 2017-04-05 NOTE — Progress Notes (Signed)
Pt gave permission for his wife to remain in the room while the questions on the nursing admission history were being asked. Lucius Conn BSN, RN-BC Admissions RN 04/05/2017 3:02 PM

## 2017-04-05 NOTE — ED Provider Notes (Addendum)
[facility] DEPT Provider Note   CSN: [id number] Arrival date & time: 04/05/17  1046     History   Chief Complaint Chief Complaint  Patient presents with  . Chest Pain  . left thumb injury    HPI Lee Krause is a 52 y.o. male w PMHx tobacco and alcohol abuse, GERD, presenting to the ED with acute onset of intermittent chest pressure with assoc SOB. Pt states he began having these episodes around 0530 this morning. States some episodes begin while he is at rest and other while he was walking at work. Reports assoc diaphoresis. He took 81mg  ASA this morning. Denies radiation of pain, nausea, other other complaints. States he has had 2 episodes while sitting in the ED prior to evaluation. Per chart review, pt had ECHO in 2014 which was normal. Pt states he had a stress test done by his Cardiologist around the same time which was normal. Reports positive family hx of cardiac disease, his brother passed away from MI at age 60. Reports personal hx of high triglycerides and untreated HTN.  The history is provided by the patient.    Past Medical History:  Diagnosis Date  . Chest pain   . Gastritis   . GERD (gastroesophageal reflux disease)   . Tobacco abuse     Patient Active Problem List   Diagnosis Date Noted  . Atypical chest pain 04/05/2017  . Hematochezia   . Lower GI bleed 05/22/2016  . Family history of ischemic heart disease 11/18/2012  . Tobacco use disorder 11/18/2012    Past Surgical History:  Procedure Laterality Date  . TOOTH EXTRACTION          Home Medications    Prior to Admission medications   Medication Sig Start Date End Date Taking? Authorizing Provider  aspirin EC 81 MG tablet Take 81 mg by mouth daily.   Yes [provider]  Multiple Vitamin (MULTIVITAMIN WITH MINERALS) TABS tablet Take 1 tablet by mouth daily.   Yes [provider]  pantoprazole (PROTONIX) 40 MG tablet Take 1 tablet (40 mg total)  by mouth daily. 05/23/16 05/23/17 Yes Regalado, Cassie Freer, MD    Family History Family History  Problem Relation Age of Onset  . Hypertension Father   . Heart attack Brother     Social History Social History   Tobacco Use  . Smoking status: Former Smoker    Types: Cigarettes  . Smokeless tobacco: Never Used  Substance Use Topics  . Alcohol use: Yes    Comment: Most days; after work  . Drug use: No     Allergies   Patient has no known allergies.   Review of Systems Review of Systems  Constitutional: Positive for diaphoresis. Negative for chills and fever.  Respiratory: Positive for shortness of breath.   Cardiovascular: Positive for chest pain (pressure). Negative for palpitations and leg swelling.  Gastrointestinal: Negative for nausea.  All other systems reviewed and are negative.    Physical Exam Updated Vital Signs BP (!) 156/99   Pulse 86   Temp 98.5 F (36.9 C) (Oral)   Resp (!) 22   Ht 6\' 4"  (1.93 m)   Wt 104.3 kg (230 lb)   SpO2 100%   BMI 28.00 kg/m   Physical Exam  Constitutional: He appears well-developed and well-nourished. No distress.  HENT:  Head: Normocephalic and atraumatic.  Eyes: Pupils are equal, round, and reactive to light. Conjunctivae and EOM are normal.  Neck: Normal  range of motion. Neck supple. No JVD present. No tracheal deviation present.  Cardiovascular: Normal rate, regular rhythm, normal heart sounds and intact distal pulses.  Pulmonary/Chest: Effort normal and breath sounds normal. No respiratory distress.  Abdominal: Soft. Bowel sounds are normal. He exhibits no distension. There is no tenderness. There is no rebound and no guarding.  Musculoskeletal:  No LE edema or tenderness  Lymphadenopathy:    He has no cervical adenopathy.  Neurological: He is alert.  Skin: Skin is warm. He is not diaphoretic.  Psychiatric: He has a normal mood and affect. His behavior is normal.  Nursing note and vitals reviewed.    ED  Treatments / Results  Labs (all labs ordered are listed, but only abnormal results are displayed) Labs Reviewed  BASIC METABOLIC PANEL - Abnormal; Notable for the following components:      Result Value   Glucose, Bld 100 (*)    All other components within normal limits  CBC  I-STAT TROPONIN, ED    EKG EKG Interpretation  Date/Time:  Monday April 05 2017 10:55:13 EDT Ventricular Rate:  79 PR Interval:    QRS Duration: 85 QT Interval:  368 QTC Calculation: 422 R Axis:   52 Text Interpretation:  Sinus rhythm Borderline T wave abnormalities Minimal ST elevation, anterior leads nonspecific lateral st changes Confirmed by Dorie Rank [phone]) on 04/05/2017 11:48:37 AM   Radiology Dg Chest 2 View  Result Date: 04/05/2017 CLINICAL DATA:  Chest pain. EXAM: CHEST - 2 VIEW COMPARISON:  Radiographs of November 03, 2012 FINDINGS: The heart size and mediastinal contours are within normal limits. Both lungs are clear. No pneumothorax or pleural effusion is noted. The visualized skeletal structures are unremarkable. IMPRESSION: No active cardiopulmonary disease. Electronically Signed   By: Marijo Conception, M.D.   On: 04/05/2017 12:38   Dg Wrist Complete Left  Result Date: 04/05/2017 CLINICAL DATA:  Left wrist pain after fall 2 weeks ago. EXAM: LEFT WRIST - COMPLETE 3+ VIEW COMPARISON:  None. FINDINGS: There is no evidence of fracture or dislocation. There is no evidence of arthropathy or other focal bone abnormality. Soft tissues are unremarkable. IMPRESSION: Normal left wrist. Electronically Signed   By: Marijo Conception, M.D.   On: 04/05/2017 12:42   Dg Finger Thumb Left  Result Date: 04/05/2017 CLINICAL DATA:  Left thumb pain after fall 2 weeks ago. EXAM: LEFT THUMB 2+V COMPARISON:  None. FINDINGS: Small bone fragment is seen dorsal to proximal base of first distal phalanx consistent with avulsion fracture of indeterminate age. No other bony abnormality is noted. No joint space abnormality is  noted. No soft tissue abnormality is noted. IMPRESSION: Small bone fragment seen dorsal to proximal base of first distal phalanx concerning for avulsion fracture of indeterminate age. Electronically Signed   By: Marijo Conception, M.D.   On: 04/05/2017 12:40    Procedures Procedures (including critical care time)  Medications Ordered in ED Medications  aspirin EC tablet 162 mg (has no administration in time range)     Initial Impression / Assessment and Plan / ED Course  I have reviewed the triage vital signs and the nursing notes.  Pertinent labs & imaging results that were available during my care of the patient were reviewed by me and considered in my medical decision making (see chart for details).  Clinical Course as of Apr 05 1441  Mon Apr 05, 2017  1423 Discussed workup with patient, and recommendation for admission given nonspecific EKG changes and  moderate heart score   [JR]    Clinical Course User Index [JR] Batul Diego, [place] N, PA-C    Patient presenting with intermittent chest pain that began this morning, with associated diaphoresis and shortness of breath.  Patient without personal cardiac history, however with significant family history and risk factors, heart score is moderate.  EKG with nonspecific ST changes in lateral leads.  Initial troponin 0.01.  CBC and BMP unremarkable.  Vital signs stable.  X-ray negative.  Given patient's symptoms and ST changes as well as moderate heart score, recommend admission for rule out.  Plan discussed with patient, who is agreeable to plan for admission.  Hospitalist consulted, Dr. Aileen Fass accepting admission.  Patient discussed with Dr. Tomi Bamberger, who agrees with care plan for admission.  The patient appears reasonably stabilized for admission considering the current resources, flow, and capabilities available in the ED at this time, and I doubt any other [facility] requiring further screening and/or treatment in the ED prior to  admission.  Final Clinical Impressions(s) / ED Diagnoses   Final diagnoses:  Precordial chest pain    ED Discharge Orders    None       Damione Robideau, [place] N, PA-C 04/05/17 1443    Zillah Alexie, [place] N, [place] 04/05/17 1459    Dorie Rank, MD 04/06/17 [phone]

## 2017-04-06 ENCOUNTER — Other Ambulatory Visit: Payer: Self-pay | Admitting: Medical

## 2017-04-06 ENCOUNTER — Telehealth: Payer: Self-pay | Admitting: Interventional Cardiology

## 2017-04-06 DIAGNOSIS — R079 Chest pain, unspecified: Secondary | ICD-10-CM | POA: Diagnosis not present

## 2017-04-06 DIAGNOSIS — R072 Precordial pain: Secondary | ICD-10-CM | POA: Diagnosis not present

## 2017-04-06 LAB — LIPID PANEL
CHOLESTEROL: 200 mg/dL (ref 0–200)
HDL: 49 mg/dL (ref >40)
LDL Cholesterol: 121 mg/dL — ABNORMAL HIGH (ref 0–99)
TRIGLYCERIDES: 151 mg/dL — AB (ref <150)
Total CHOL/HDL Ratio: 4.1 RATIO
VLDL: 30 mg/dL (ref 0–40)

## 2017-04-06 LAB — TROPONIN I

## 2017-04-06 LAB — HIV ANTIBODY (ROUTINE TESTING W REFLEX): HIV Screen 4th Generation wRfx: NONREACTIVE

## 2017-04-06 MED ORDER — HYDROCHLOROTHIAZIDE 12.5 MG PO CAPS: 12.5000 mg | ORAL_CAPSULE | Freq: Every day | ORAL | 0 refills | Status: DC

## 2017-04-06 MED ORDER — HYDROCHLOROTHIAZIDE 12.5 MG PO CAPS
12.5000 mg | ORAL_CAPSULE | Freq: Every day | ORAL | Status: DC
  Administered 2017-04-06: 12.5 mg via ORAL
  Filled 2017-04-06: qty 1

## 2017-04-06 MED ORDER — ATORVASTATIN CALCIUM 40 MG PO TABS: 40.0000 mg | ORAL_TABLET | Freq: Every day | ORAL | 1 refills | Status: DC

## 2017-04-06 MED ORDER — ATORVASTATIN CALCIUM 40 MG PO TABS: 40.0000 mg | ORAL_TABLET | Freq: Every day | ORAL | Status: DC

## 2017-04-06 NOTE — Consult Note (Signed)
Cardiology Consultation:   Patient ID: Lee Krause; [id number]; 11-12-1965   Admit date: 04/05/2017 Date of Consult: 04/06/2017  Primary Care Provider: Patient, No Pcp Per Primary Cardiologist: Dr. Irish Lack (remotely) Primary Electrophysiologist:  None   Patient Profile:   Lee Krause is a 52 y.o. male with a PMH of HTN, HLD, GERD, tobacco abuse, who is being seen today for the evaluation of chest pain at the request of Dr. Herbert Moors.  History of Present Illness:   Lee Krause was in his usual state of health until yesterday morning when he experienced central chest pressure shortly after waking while at rest. He states he typically gets this chest pressure at rest 1x per month. The chest pressure was associated with some SOB, and resolved after a few minutes. While at work later that day, he experienced chest pressure with activity, associated with SOB and mild diaphoresis which was usual for him, prompting him to present to the ED for further evaluation. He denied associated nausea, vomiting, dizziness, lightheadedness, or syncope.   Since arrival to the ED he has had continued to have intermittent chest pressure at rest, notably starting around 7am this morning, where he describes brief episodes of chest pressure which feel almost cramp like, lasting for a few minutes, and resolving spontaneously. He denies recent orthopnea, PND, LE edema, syncope, fevers, illnesses, or stomach problems. He has a history of GERD for which he intermittently takes omeprazole. He does not feel the chest pressure is related to his typical reflux.   Risk factors include HTN and HLD (not on medications), ongoing tobacco abuse, and family history of premature CAD (brother died from MI in 73s). His last exercises stress test was in 2014 and normal. He has not seen a cardiologist since that time. He is also in need of a PCP. States he has cut back on tobacco use to 1-2 cigarettes per day and plans to avoid them  going forward. He does report drinking 1-2 beers per day + 2-3 shots per day. He started a new job in 11/2016 which he states is not stressful, however is labor intensive.  Hospital course: mild-moderate hypertension, otherwise VSS. Labs notable for electrolytes wnl, Cr 1.07, Hgb 13.5, PLT 305, Troponin negative x3, Lipids with Tcholeterol 200, LDL 121, triglycerides 151. Sinus rhythm with minimal STE in V2-3, nonspecific ST changes in lateral leads. CXR without acute findings. XR L thumb/wrist c/f proximal base of 1st phalanx avulsion fx of indeterminate age; no wrist fx. Patient was given ASA and admitted to medicine for observation. Cardiology consulted for further recommendations.   Past Medical History:  Diagnosis Date  . Chest pain   . Gastritis   . GERD (gastroesophageal reflux disease)   . Tobacco abuse     Past Surgical History:  Procedure Laterality Date  . TOOTH EXTRACTION       Home Medications:  Prior to Admission medications   Medication Sig Start Date End Date Taking? Authorizing Provider  aspirin EC 81 MG tablet Take 81 mg by mouth daily.   Yes [provider]  Multiple Vitamin (MULTIVITAMIN WITH MINERALS) TABS tablet Take 1 tablet by mouth daily.   Yes [provider]  pantoprazole (PROTONIX) 40 MG tablet Take 1 tablet (40 mg total) by mouth daily. 05/23/16 05/23/17 Yes Regalado, Belkys A, MD    Inpatient Medications: Scheduled Meds: . pantoprazole  40 mg Oral Daily  . sodium chloride flush  3 mL Intravenous Q12H   Continuous Infusions: . sodium  chloride     PRN Meds: sodium chloride, acetaminophen **OR** acetaminophen, polyethylene glycol, sodium chloride flush  Allergies:   No Known Allergies  Social History:   Social History   Socioeconomic History  . Marital status: Married    Spouse name: Not on file  . Number of children: Not on file  . Years of education: Not on file  . Highest education level: Not on file  Occupational History    . Not on file  Social Needs  . Financial resource strain: Not on file  . Food insecurity:    Worry: Not on file    Inability: Not on file  . Transportation needs:    Medical: Not on file    Non-medical: Not on file  Tobacco Use  . Smoking status: Current Every Day Smoker    Packs/day: 0.25    Types: Cigarettes  . Smokeless tobacco: Never Used  Substance and Sexual Activity  . Alcohol use: Yes    Comment: Most days; after work  . Drug use: No  . Sexual activity: Yes  Lifestyle  . Physical activity:    Days per week: Not on file    Minutes per session: Not on file  . Stress: Not on file  Relationships  . Social connections:    Talks on phone: Not on file    Gets together: Not on file    Attends religious service: Not on file    Active member of club or organization: Not on file    Attends meetings of clubs or organizations: Not on file    Relationship status: Not on file  . Intimate partner violence:    Fear of current or ex partner: Not on file    Emotionally abused: Not on file    Physically abused: Not on file    Forced sexual activity: Not on file  Other Topics Concern  . Not on file  Social History Narrative  . Not on file    Family History:    Family History  Problem Relation Age of Onset  . Hypertension Father   . Heart attack Brother      ROS:  Please see the history of present illness.   All other ROS reviewed and negative.     Physical Exam/Data:   Vitals:   04/05/17 1515 04/05/17 1556 04/05/17 1932 04/06/17 0512  BP: (!) 156/108 (!) 159/92 134/85 (!) 139/93  Pulse: 71 79 82 73  Resp: 16 18 18 16   Temp:   98.6 F (37 C) 98.3 F (36.8 C)  TempSrc:   Oral Oral  SpO2: 99% 99% 100% 98%  Weight:      Height:        Intake/Output Summary (Last 24 hours) at 04/06/2017 0732 Last data filed at 04/05/2017 2131 Gross per 24 hour  Intake 3 ml  Output 300 ml  Net -297 ml   Filed Weights   04/05/17 1058  Weight: 230 lb (104.3 kg)   Body mass  index is 28 kg/m.  General:  Well nourished, well developed, laying in bed in no acute distress HEENT: sclera anicteric  Neck: no JVD Vascular: No carotid bruits; distal pulses 2+ bilaterally Cardiac:  normal S1, S2; RRR; no murmur, gallops, or rubs Lungs:  clear to auscultation bilaterally, no wheezing, rhonchi or rales  Abd: NABS, soft, nontender, no hepatomegaly Ext: no edema Musculoskeletal:  No deformities, BUE and BLE strength normal and equal Skin: warm and dry  Neuro:  CNs 2-12 intact,  no focal abnormalities noted Psych:  Normal affect   EKG:  The EKG was personally reviewed and demonstrates:  Sinus rhythm with minimal STE in V2-3, nonspecific ST changes in lateral leads Telemetry:  Telemetry was personally reviewed and demonstrates:  NSR  Relevant CV Studies:  Echocardiogram 04/05/17: Study Conclusions  - Left ventricle: The cavity size was normal. There was mild focal   basal hypertrophy of the septum. Systolic function was normal.   The estimated ejection fraction was in the range of 55% to 60%.   Wall motion was normal; there were no regional wall motion   abnormalities. Left ventricular diastolic function parameters   were normal.   Laboratory Data:  Chemistry Recent Labs  Lab 04/05/17 1145  NA 140  K 3.7  CL 107  CO2 24  GLUCOSE 100*  BUN 16  CREATININE 1.07  CALCIUM 9.2  GFRNONAA >60  GFRAA >60  ANIONGAP 9    No results for input(s): PROT, ALBUMIN, AST, ALT, ALKPHOS, BILITOT in the last 168 hours. Hematology Recent Labs  Lab 04/05/17 1145  WBC 9.1  RBC 4.44  HGB 13.5  HCT 39.9  MCV 89.9  MCH 30.4  MCHC 33.8  RDW 12.7  PLT 305   Cardiac Enzymes Recent Labs  Lab 04/05/17 1700 04/05/17 2305 04/06/17 0539  TROPONINI <0.03 <0.03 <0.03    Recent Labs  Lab 04/05/17 1148 04/05/17 1518  TROPIPOC 0.01 0.00    BNPNo results for input(s): BNP, PROBNP in the last 168 hours.  DDimer No results for input(s): DDIMER in the last 168  hours.  Radiology/Studies:  Dg Chest 2 View  Result Date: 04/05/2017 CLINICAL DATA:  Chest pain. EXAM: CHEST - 2 VIEW COMPARISON:  Radiographs of November 03, 2012 FINDINGS: The heart size and mediastinal contours are within normal limits. Both lungs are clear. No pneumothorax or pleural effusion is noted. The visualized skeletal structures are unremarkable. IMPRESSION: No active cardiopulmonary disease. Electronically Signed   By: Marijo Conception, M.D.   On: 04/05/2017 12:38   Dg Wrist Complete Left  Result Date: 04/05/2017 CLINICAL DATA:  Left wrist pain after fall 2 weeks ago. EXAM: LEFT WRIST - COMPLETE 3+ VIEW COMPARISON:  None. FINDINGS: There is no evidence of fracture or dislocation. There is no evidence of arthropathy or other focal bone abnormality. Soft tissues are unremarkable. IMPRESSION: Normal left wrist. Electronically Signed   By: Marijo Conception, M.D.   On: 04/05/2017 12:42   Dg Finger Thumb Left  Result Date: 04/05/2017 CLINICAL DATA:  Left thumb pain after fall 2 weeks ago. EXAM: LEFT THUMB 2+V COMPARISON:  None. FINDINGS: Small bone fragment is seen dorsal to proximal base of first distal phalanx consistent with avulsion fracture of indeterminate age. No other bony abnormality is noted. No joint space abnormality is noted. No soft tissue abnormality is noted. IMPRESSION: Small bone fragment seen dorsal to proximal base of first distal phalanx concerning for avulsion fracture of indeterminate age. Electronically Signed   By: Marijo Conception, M.D.   On: 04/05/2017 12:40    Assessment and Plan:   1. Atypical chest pain: p/w CP with associated diaphoresis and SOB. Typically has 1 episode per month at rest, however yesterday occurred with exertion. Risk factors for CAD include HTN, HLD, ongoing tobacco abuse, and family history of premature CAD. He has not had an ischemic evaluation since 2014 (exercise stress test - normal). Here troponin has been negative x3, EKG with non-specific  ST/T wave changes, CXR  negative, Echo with EF 55-60%, no wall motion abnormalities, and normal LV function.  - Given risk factors, patient would benefit from further ischemic evaluation, however since troponins have been negative, this can likely be done outpatient. Patient ate breakfast this morning so earliest inpatient stress test would be 3/27. - Would benefit from risk factor modifications - better BP and cholesterol control, as well as smoking and ETOH cessation - Continue ASA  2. HTN: BP mild-moderately elevated this admission. Has never been on medications - Start hydrochlorothiazide 12.5mg  daily  3. Dyslipidemia: LDL 121 - Start atorvastatin 40mg  daily  4. Tobacco abuse: smokes 1-2 cigarettes per day. Plans to stop after discharge. - Encouraged cessation  5. ETOH abuse: drinks 1-2 beers and 2-3 shots per day. Plans to cut back - Encourage cessation/ healthy moderation   For questions or updates, please contact [place] Please consult [email or web address] for contact info under Cardiology/STEMI.   Signed, Abigail Butts, PA-C  04/06/2017 7:32 AM [phone]

## 2017-04-06 NOTE — Telephone Encounter (Signed)
TOC Phone Call  Appt is on 04/20/17 at 12pm w/ Estella Husk . Thanks

## 2017-04-06 NOTE — Telephone Encounter (Signed)
**Note De-identified Lee Krause Obfuscation** The pt is being discharged from the hospital today. Will call tomorrow. 

## 2017-04-06 NOTE — Discharge Instructions (Signed)
° °  You have a Stress Test scheduled at [facility]. Your doctor has ordered this test to check the blood flow in your heart arteries. ° °Please arrive 15 minutes early for paperwork. The whole test will take several hours. You may want to bring reading material to remain occupied while undergoing different parts of the test. ° °Instructions: °· No food/drink after midnight the night before. °· It is OK to take your morning meds with a sip of water EXCEPT for those types of medicines listed below or otherwise instructed. °· No caffeine/decaf products 24 hours before, including medicines such as Excedrin or Goody Powders. Call if there are any questions.  °· Wear comfortable clothes and shoes.  ° °Special Medication Instructions: °· Beta blockers such as metoprolol (Lopressor/Toprol XL), atenolol (Tenormin), carvedilol (Coreg), nebivolol (Bystolic), bisoprolol (Zebeta), propranolol (Inderal) should not be taken for 24 hours before the test. °· Calcium channel blockers such as diltiazem (Cardizem) or verapmil (Calan) should not be taken for 24 hours before the test. °· Remove nitroglycerin patches and do not take nitrate preparations such as Imdur/isosorbide the day of your test. °· No Persantine/Theophylline or Aggrenox medicines should be used within 24 hours of the test.  °· If you are diabetic, please ask which medications to hold the day of the test ° °What To Expect: °When you arrive in the lab, the technician will inject a small amount of radioactive tracer into your arm through an IV while you are resting quietly. This helps us to form pictures of your heart. You will likely only feel a sting from the IV. After a waiting period, resting pictures will be obtained under a big camera. These are the "before" pictures. ° °Next, you will be prepped for the stress portion of the test. This may include either walking on a treadmill or receiving a medicine that helps to dilate blood vessels in  your heart to simulate the effect of exercise on your heart. If you are walking on a treadmill, you will walk at different paces to try to get your heart rate to a goal number that is based on your age. If your doctor has chosen the pharmacologic test, then you will receive a medicine through your IV that may cause temporary nausea, flushing, shortness of breath and sometimes chest discomfort or vomiting. This is typically short-lived and usually resolves quickly. If you experience symptoms, that does not automatically mean the test is abnormal. Some patients do not experience any symptoms at all. Your blood pressure and heart rate will be monitored, and we will be watching your EKG on a computer screen for any changes. During this portion of the test, the radiologist will inject another small amount of radioactive tracer into your IV. After a waiting period, you will undergo a second set of pictures. These are the "after" pictures. ° °The doctor reading the test will compare the before-and-after images to look for evidence of heart blockages or heart weakness. The test usually takes 1 day to complete, but in certain instances (for example, if a patient is over a certain weight limit), the test may be done over the span of 2 days. ° ° °

## 2017-04-06 NOTE — Discharge Summary (Signed)
Physician Discharge Summary  Lee Krause DTO:[id number] DOB: [birth year] DOA: 04/05/2017  PCP: Patient, No Pcp Per  Admit date: 04/05/2017 Discharge date: 04/06/2017  Admitted From: Home Disposition: Home  Recommendations for Outpatient Follow-up:  1. Follow up with PCP in 1-2 weeks 2. Please obtain BMP/CBC in one week   Home Health: No Equipment/Devices: None  Discharge Condition stable CODE STATUS: Full Diet recommendation: Heart healthy  Brief/Interim Summary:  #) Atypical chest pain: Patient was admitted with atypical chest pain.  Labs were unremarkable.  EKG showed no acute ST segment changes.  Cardiac biomarkers were negative.  Cardiology was consulted and recommended outpatient stress.  Patient was given referral for stress test on 04/08/2017.  Patient was continued on aspirin 81 mg.  #) Disease hypertension: Patient was started on HCTZ 12.5 mg daily.  #) Disease hyperlipidemia: Patient was started on atorvastatin 40 mg daily.  Discharge Diagnoses:  Active Problems:   Atypical chest pain   Essential hypertension    Discharge Instructions  Discharge Instructions    Call MD for:  difficulty breathing, headache or visual disturbances   Complete by:  As directed    Call MD for:  persistant nausea and vomiting   Complete by:  As directed    Call MD for:  redness, tenderness, or signs of infection (pain, swelling, redness, odor or green/yellow discharge around incision site)   Complete by:  As directed    Call MD for:  severe uncontrolled pain   Complete by:  As directed    Call MD for:  temperature >100.4   Complete by:  As directed    Diet - low sodium heart healthy   Complete by:  As directed    Discharge instructions   Complete by:  As directed    Please follow-up with your PCP in 1 week.  Please follow-up with cardiology for your outpatient stress test.   Increase activity slowly   Complete by:  As directed      Allergies as of 04/06/2017   No Known  Allergies     Medication List    STOP taking these medications   pantoprazole 40 MG tablet Commonly known as:  PROTONIX     TAKE these medications   aspirin EC 81 MG tablet Take 81 mg by mouth daily.   atorvastatin 40 MG tablet Commonly known as:  LIPITOR Take 1 tablet (40 mg total) by mouth daily at 6 PM.   hydrochlorothiazide 12.5 MG capsule Commonly known as:  MICROZIDE Take 1 capsule (12.5 mg total) by mouth daily. Start taking on:  04/07/2017   multivitamin with minerals Tabs tablet Take 1 tablet by mouth daily.      Follow-up Information    Imogene Burn, PA-C Follow up on 04/20/2017.   Specialty:  Cardiology Why:  Please arrive 15 minutes early for your 12pm Cardiology appointment Contact information: [address] [place] [address] [phone]        Nuclear Stress Test appointment Follow up on 04/08/2017.   Why:  Please arrive 15 minutes early for your 12:30pm appointment. Wear loose clothing and do not eat or drink anything after MIDNIGHT the night before. This study will take ~4 hours to complete.  Contact information: [facility] [address] [place] [address]         No Known Allergies  Consultations:  Cardiology   Procedures/Studies: Dg Chest 2 View  Result Date: 04/05/2017 CLINICAL DATA:  Chest pain.  EXAM: CHEST - 2 VIEW COMPARISON:  Radiographs of November 03, 2012 FINDINGS: The heart size and mediastinal contours are within normal limits. Both lungs are clear. No pneumothorax or pleural effusion is noted. The visualized skeletal structures are unremarkable. IMPRESSION: No active cardiopulmonary disease. Electronically Signed   By: Marijo Conception, M.D.   On: 04/05/2017 12:38   Dg Wrist Complete Left  Result Date: 04/05/2017 CLINICAL DATA:  Left wrist pain after fall 2 weeks ago. EXAM: LEFT WRIST - COMPLETE 3+ VIEW COMPARISON:  None. FINDINGS: There is no evidence of fracture or  dislocation. There is no evidence of arthropathy or other focal bone abnormality. Soft tissues are unremarkable. IMPRESSION: Normal left wrist. Electronically Signed   By: Marijo Conception, M.D.   On: 04/05/2017 12:42   Dg Finger Thumb Left  Result Date: 04/05/2017 CLINICAL DATA:  Left thumb pain after fall 2 weeks ago. EXAM: LEFT THUMB 2+V COMPARISON:  None. FINDINGS: Small bone fragment is seen dorsal to proximal base of first distal phalanx consistent with avulsion fracture of indeterminate age. No other bony abnormality is noted. No joint space abnormality is noted. No soft tissue abnormality is noted. IMPRESSION: Small bone fragment seen dorsal to proximal base of first distal phalanx concerning for avulsion fracture of indeterminate age. Electronically Signed   By: Marijo Conception, M.D.   On: 04/05/2017 12:40    04/05/2017 echo:- Left ventricle: The cavity size was normal. There was mild focal   basal hypertrophy of the septum. Systolic function was normal.   The estimated ejection fraction was in the range of 55% to 60%.   Wall motion was normal; there were no regional wall motion   abnormalities. Left ventricular diastolic function parameters   were normal.   Subjective:   Discharge Exam: Vitals:   04/05/17 1932 04/06/17 0512  BP: 134/85 (!) 139/93  Pulse: 82 73  Resp: 18 16  Temp: 98.6 F (37 C) 98.3 F (36.8 C)  SpO2: 100% 98%   Vitals:   04/05/17 1515 04/05/17 1556 04/05/17 1932 04/06/17 0512  BP: (!) 156/108 (!) 159/92 134/85 (!) 139/93  Pulse: 71 79 82 73  Resp: 16 18 18 16   Temp:   98.6 F (37 C) 98.3 F (36.8 C)  TempSrc:   Oral Oral  SpO2: 99% 99% 100% 98%  Weight:      Height:        General: Pt is alert, awake, not in acute distress Cardiovascular: RRR, S1/S2 +, no rubs, no gallops Respiratory: CTA bilaterally, no wheezing, no rhonchi Abdominal: Soft, NT, ND, bowel sounds + Extremities: no edema    The results of significant diagnostics from this  hospitalization (including imaging, microbiology, ancillary and laboratory) are listed below for reference.     Microbiology: No results found for this or any previous visit (from the past 240 hour(s)).   Labs: BNP (last 3 results) No results for input(s): BNP in the last 8760 hours. Basic Metabolic Panel: Recent Labs  Lab 04/05/17 1145  NA 140  K 3.7  CL 107  CO2 24  GLUCOSE 100*  BUN 16  CREATININE 1.07  CALCIUM 9.2   Liver Function Tests: No results for input(s): AST, ALT, ALKPHOS, BILITOT, PROT, ALBUMIN in the last 168 hours. No results for input(s): LIPASE, AMYLASE in the last 168 hours. No results for input(s): AMMONIA in the last 168 hours. CBC: Recent Labs  Lab 04/05/17 1145  WBC 9.1  HGB 13.5  HCT 39.9  MCV 89.9  PLT 305   Cardiac Enzymes: Recent Labs  Lab 04/05/17 1700 04/05/17 2305 04/06/17 0539  TROPONINI <0.03 <0.03 <0.03   BNP: Invalid input(s): POCBNP CBG: No results for input(s): GLUCAP in the last 168 hours. D-Dimer No results for input(s): DDIMER in the last 72 hours. Hgb A1c No results for input(s): HGBA1C in the last 72 hours. Lipid Profile Recent Labs    04/06/17 0539  CHOL 200  HDL 49  LDLCALC 121*  TRIG 151*  CHOLHDL 4.1   Thyroid function studies No results for input(s): TSH, T4TOTAL, T3FREE, THYROIDAB in the last 72 hours.  Invalid input(s): FREET3 Anemia work up No results for input(s): VITAMINB12, FOLATE, FERRITIN, TIBC, IRON, RETICCTPCT in the last 72 hours. Urinalysis No results found for: COLORURINE, APPEARANCEUR, LABSPEC, [place], GLUCOSEU, HGBUR, BILIRUBINUR, KETONESUR, PROTEINUR, UROBILINOGEN, NITRITE, LEUKOCYTESUR Sepsis Labs Invalid input(s): PROCALCITONIN,  WBC,  LACTICIDVEN Microbiology No results found for this or any previous visit (from the past 240 hour(s)).   Time coordinating discharge: Over 30 minutes  SIGNED:   Cristy Folks, MD  [facility] 04/06/2017, 12:19 PM   If 7PM-7AM,  please contact night-coverage [email or web address] Password [email or web address]

## 2017-04-06 NOTE — Care Management Note (Signed)
Case Management Note  Patient Details  Name: Lee Krause MRN: [id number] Date of Birth: Nov 26, 1965  Subjective/Objective:  No CM needs.                  Action/Plan:d/c home.   Expected Discharge Date:  04/06/17               Expected Discharge Plan:  Home/Self Care  In-House Referral:     Discharge planning Services  CM Consult  Post Acute Care Choice:    Choice offered to:     DME Arranged:    DME Agency:     HH Arranged:    HH Agency:     Status of Service:  Completed, signed off  If discussed at [employer] of Stay Meetings, dates discussed:    Additional Comments:  Dessa Phi, RN 04/06/2017, 12:32 PM

## 2017-04-07 ENCOUNTER — Telehealth (HOSPITAL_COMMUNITY): Payer: Self-pay

## 2017-04-07 ENCOUNTER — Telehealth: Payer: Self-pay | Admitting: Interventional Cardiology

## 2017-04-07 NOTE — Telephone Encounter (Signed)
Encounter complete. 

## 2017-04-07 NOTE — Telephone Encounter (Addendum)
TCM 1st attempt LMTCB.

## 2017-04-07 NOTE — Telephone Encounter (Signed)
Lmsg for Lee Krause about the appt having to be rescheduled due to his case is still under review per pre-cert [facility])

## 2017-04-08 ENCOUNTER — Ambulatory Visit (HOSPITAL_COMMUNITY)
Admission: RE | Admit: 2017-04-08 | Discharge: 2017-04-08 | Disposition: A | Discharge status: Home / Self Care | Payer: Managed Care, Other (non HMO) | Source: Ambulatory Visit | Attending: Cardiology | Admitting: Cardiology

## 2017-04-08 ENCOUNTER — Ambulatory Visit (HOSPITAL_COMMUNITY): Admit: 2017-04-08 | Payer: Managed Care, Other (non HMO) | Attending: Medical | Admitting: Medical

## 2017-04-08 ENCOUNTER — Telehealth: Payer: Self-pay | Admitting: *Deleted

## 2017-04-08 DIAGNOSIS — R079 Chest pain, unspecified: Secondary | ICD-10-CM | POA: Insufficient documentation

## 2017-04-08 LAB — MYOCARDIAL PERFUSION IMAGING
CHL CUP MPHR: 171 {beats}/min
CHL CUP NUCLEAR SDS: 0
CHL CUP NUCLEAR SSS: 0
CHL CUP RESTING HR STRESS: 67 {beats}/min
CHL RATE OF PERCEIVED EXERTION: 18
CSEPED: 11 min
CSEPEW: 13.4 METS
Exercise duration (sec): 30 s
LV dias vol: 123 mL (ref 62–150)
LV sys vol: 61 mL
Peak HR: 171 {beats}/min
Percent HR: 100 %
SRS: 0
TID: 0.83

## 2017-04-08 MED ORDER — TECHNETIUM TC 99M TETROFOSMIN IV KIT
10.8000 | PACK | Freq: Once | INTRAVENOUS | Status: AC | PRN
  Administered 2017-04-08: 10.8 via INTRAVENOUS
  Filled 2017-04-08: qty 11

## 2017-04-08 MED ORDER — TECHNETIUM TC 99M TETROFOSMIN IV KIT
31.7000 | PACK | Freq: Once | INTRAVENOUS | Status: AC | PRN
Start: 2017-04-08 — End: 2017-04-08
  Administered 2017-04-08: 31.7 via INTRAVENOUS
  Filled 2017-04-08: qty 32

## 2017-04-08 NOTE — Telephone Encounter (Signed)
TCM call #2, left message on patient answering machine reminding him of appointment with Gala Lewandowsky PA on 04/20/17@12pm .

## 2017-04-12 ENCOUNTER — Telehealth: Payer: Self-pay | Admitting: Physician Assistant

## 2017-04-12 NOTE — Telephone Encounter (Signed)
Pt calling to give Estella Husk PA-C an FYI for his upcoming appt with her on 04/14/17, for TOC follow-up. Pt states that he would like for Sharyn Lull to write a very brief letter stating that he was in the hospital and he is currently being followed by our Outpatient Clinic for further management and cardiac work-up. Pt states that he needs the letter to state he can return to work, and what restrictions, if any, does he need to be on. Pt states that this is not FMLA related.  He only needs a short note. Pt states he is feeling much better. Informed the pt that I will make Estella Husk PA-C aware of needed note, when she see's him on 4/3.  Pt verbalized understanding and agrees with this plan.

## 2017-04-12 NOTE — Telephone Encounter (Signed)
Patient calling, rescheduled appt to an earlier date. Patient states that he needs a letter stating that he can return to work. Patient states that the letter he received from hospital is not sufficient because it did not list what the patient "can and cannot do." Patient would like to have a letter to give to job when he comes to appt on 04-14-17.

## 2017-04-13 NOTE — Progress Notes (Signed)
Cardiology Office Note    Date:  04/14/2017   ID:  TRAVARIUS Krause, DOB 1965-07-05, MRN [id number]  PCP:  Patient, No Pcp Per  Cardiologist: Larae Grooms, MD  Chief Complaint  Patient presents with  . Follow-up    History of Present Illness:  Lee Krause is a 52 y.o. male previously seen by Dr. Irish Lack in 2014 for atypical chest pain, family history of early CAD with a brother dying of an MI at age 42.  Baseline GXT done and he was advised to quit smoking.  Patient also has hypertension and HLD.  Patient was discharged from the hospital 04/06/17 with atypical chest pain.  Troponins negative, outpatient 2D echo normal LVEF, nuclear stress test LVEF low normal 50% hypertensive response to exercise, low risk study.  Still complains of chest pressure/squeezing, feels like strained himself working out, but hasn't worked out. Also indigestion symptoms and told to stop omeprazole. some shortness of breath when he dropped his son off this am. A lot of anxiety. Worried about turning 68, wife and son have been sick. New job in Nov. Under a lot of stress.  Past Medical History:  Diagnosis Date  . Chest pain   . Gastritis   . GERD (gastroesophageal reflux disease)   . Tobacco abuse     Past Surgical History:  Procedure Laterality Date  . TOOTH EXTRACTION      Current Medications: Current Meds  Medication Sig  . aspirin EC 81 MG tablet Take 81 mg by mouth daily.  Marland Kitchen atorvastatin (LIPITOR) 40 MG tablet Take 1 tablet (40 mg total) by mouth daily at 6 PM.  . hydrochlorothiazide (MICROZIDE) 12.5 MG capsule Take 1 capsule (12.5 mg total) by mouth daily.  . Multiple Vitamin (MULTIVITAMIN WITH MINERALS) TABS tablet Take 1 tablet by mouth daily.     Allergies:   Patient has no known allergies.   Social History   Socioeconomic History  . Marital status: Married    Spouse name: Not on file  . Number of children: Not on file  . Years of education: Not on file  . Highest education  level: Not on file  Occupational History  . Not on file  Social Needs  . Financial resource strain: Not on file  . Food insecurity:    Worry: Not on file    Inability: Not on file  . Transportation needs:    Medical: Not on file    Non-medical: Not on file  Tobacco Use  . Smoking status: Current Every Day Smoker    Packs/day: 0.25    Types: Cigarettes  . Smokeless tobacco: Never Used  Substance and Sexual Activity  . Alcohol use: Yes    Comment: Most days; after work  . Drug use: No  . Sexual activity: Yes  Lifestyle  . Physical activity:    Days per week: Not on file    Minutes per session: Not on file  . Stress: Not on file  Relationships  . Social connections:    Talks on phone: Not on file    Gets together: Not on file    Attends religious service: Not on file    Active member of club or organization: Not on file    Attends meetings of clubs or organizations: Not on file    Relationship status: Not on file  Other Topics Concern  . Not on file  Social History Narrative  . Not on file     Family History:  The patient's family history includes Heart attack in his brother; Hypertension in his father.   ROS:   Please see the history of present illness.    Review of Systems  Constitution: Negative.  HENT: Negative.   Cardiovascular: Positive for chest pain and dyspnea on exertion.  Respiratory: Negative.   Endocrine: Negative.   Hematologic/Lymphatic: Negative.   Musculoskeletal: Positive for back pain.  Gastrointestinal: Negative.   Genitourinary: Negative.   Neurological: Negative.   Psychiatric/Behavioral: The patient is nervous/anxious.    All other systems reviewed and are negative.   PHYSICAL EXAM:   VS:  BP 120/72   Pulse 91   Ht 6\' 4"  (1.93 m)   Wt 232 lb (105.2 kg)   SpO2 98%   BMI 28.24 kg/m   Physical Exam  GEN: Well nourished, well developed, in no acute distress  Neck: no JVD, carotid bruits, or masses Cardiac:RRR; no murmurs, rubs, or  gallops  Respiratory:  clear to auscultation bilaterally, normal work of breathing GI: soft, nontender, nondistended, + BS Ext: without cyanosis, clubbing, or edema, Good distal pulses bilaterally Neuro:  Alert and Oriented x 3 Psych: euthymic mood, full affect  Wt Readings from Last 3 Encounters:  04/14/17 232 lb (105.2 kg)  04/08/17 230 lb (104.3 kg)  04/05/17 230 lb (104.3 kg)      Studies/Labs Reviewed:   EKG:  EKG is not ordered today.   Recent Labs: 05/22/2016: ALT 21 04/05/2017: BUN 16; Creatinine, Ser 1.07; Hemoglobin 13.5; Platelets 305; Potassium 3.7; Sodium 140   Lipid Panel    Component Value Date/Time   CHOL 200 04/06/2017 0539   TRIG 151 (H) 04/06/2017 0539   HDL 49 04/06/2017 0539   CHOLHDL 4.1 04/06/2017 0539   VLDL 30 04/06/2017 0539   LDLCALC 121 (H) 04/06/2017 0539   LDLDIRECT 130.6 11/25/2012 1540    Additional studies/ records that were reviewed today include:  2D echo 3/25/19Study Conclusions   - Left ventricle: The cavity size was normal. There was mild focal   basal hypertrophy of the septum. Systolic function was normal.   The estimated ejection fraction was in the range of 55% to 60%.   Wall motion was normal; there were no regional wall motion   abnormalities. Left ventricular diastolic function parameters   were normal.   NST 3/28/19Study Highlights      The left ventricular ejection fraction is mildly decreased (45-54%).  Nuclear stress EF: 50%.  Blood pressure demonstrated a hypertensive response to exercise.  There was no ST segment deviation noted during stress.  This is a low risk study.   Low risk, probably normal stress nuclear study with no ischemia or infarction; EF mildly reduced at 50 but visually appears normal; mild LVE; suggest echocardiogram to further assess LV function.          ASSESSMENT:    1. Atypical chest pain   2. Essential hypertension   3. Tobacco use disorder   4. Family history of ischemic heart  disease      PLAN:  In order of problems listed above:  Atypical chest pain with low risk nuclear study 04/08/17 hypertensive response to exercise low normal EF but follow-up echo showed normal LV function.  She is still having a lot of symptoms.  Will start Toprol-XL 25 mg once daily with hyper tensive response.  Also add Protonix 40 mg once daily with GI symptoms.  Follow-up with Dr. Irish Lack in a month.  Filled out papers so he can return  to work next week.  Essential hypertension blood pressure stable today but did go up with exercise.  Begin Toprol-XL 25 mg once daily  Tobacco abuse quit last week  Family history of CAD brother died of an MI in his 28s    Medication Adjustments/Labs and Tests Ordered: Current medicines are reviewed at length with the patient today.  Concerns regarding medicines are outlined above.  Medication changes, Labs and Tests ordered today are listed in the Patient Instructions below. There are no Patient Instructions on file for this visit.   Sumner Boast, PA-C  04/14/2017 9:46 AM    [facility] [address], [place], [place]  [address] Phone: [phone]; Fax: [phone]

## 2017-04-14 ENCOUNTER — Ambulatory Visit (INDEPENDENT_AMBULATORY_CARE_PROVIDER_SITE_OTHER): Payer: Managed Care, Other (non HMO) | Admitting: Physician Assistant

## 2017-04-14 ENCOUNTER — Encounter: Payer: Self-pay | Admitting: Physician Assistant

## 2017-04-14 VITALS — BP 120/72 | HR 91 | Ht 76.0 in | Wt 232.0 lb

## 2017-04-14 DIAGNOSIS — I10 Essential (primary) hypertension: Secondary | ICD-10-CM

## 2017-04-14 DIAGNOSIS — Z8249 Family history of ischemic heart disease and other diseases of the circulatory system: Secondary | ICD-10-CM

## 2017-04-14 DIAGNOSIS — F17200 Nicotine dependence, unspecified, uncomplicated: Secondary | ICD-10-CM

## 2017-04-14 DIAGNOSIS — R0789 Other chest pain: Secondary | ICD-10-CM | POA: Diagnosis not present

## 2017-04-14 MED ORDER — METOPROLOL SUCCINATE ER 25 MG PO TB24: 25.0000 mg | ORAL_TABLET | Freq: Every day | ORAL | 3 refills | Status: DC

## 2017-04-14 MED ORDER — PANTOPRAZOLE SODIUM 40 MG PO TBEC: 40.0000 mg | DELAYED_RELEASE_TABLET | Freq: Every day | ORAL | 3 refills | Status: DC

## 2017-04-14 NOTE — Patient Instructions (Signed)
Medication Instructions:  Your physician has recommended you make the following change in your medication:   1. START: metoprolol succinate (Toprol-XL) 25 mg once daily  2. START: pantoprazole (protonix) 40 mg once daily  Labwork: None ordered  Testing/Procedures: None ordered  Follow-Up: Your physician recommends that you schedule a follow-up appointment in: 1 month with Dr. Irish Lack    Any Other Special Instructions Will Be Listed Below (If Applicable).     If you need a refill on your cardiac medications before your next appointment, please call your pharmacy.

## 2017-04-14 NOTE — Addendum Note (Signed)
Addended by: Drue Novel I on: 04/14/2017 10:08 AM   Modules accepted: Orders

## 2017-04-20 ENCOUNTER — Ambulatory Visit: Payer: Managed Care, Other (non HMO) | Admitting: Physician Assistant

## 2017-05-26 NOTE — Progress Notes (Deleted)
Cardiology Office Note   Date:  05/26/2017   ID:  Lee Krause, DOB [birth year], MRN [id number]  PCP:  Patient, No Pcp Per    No chief complaint on file.    Wt Readings from Last 3 Encounters:  04/14/17 232 lb (105.2 kg)  04/08/17 230 lb (104.3 kg)  04/05/17 230 lb (104.3 kg)       History of Present Illness: Lee Krause is a 52 y.o. male  Who I saw for atypical chest pain, family history of early CAD with a brother dying of an MI at age 52.  Baseline GXT done and he was advised to quit smoking.  Patient also has hypertension and HLD.  Patient was discharged from the hospital 04/06/17 with atypical chest pain.  Troponins negative, outpatient 2D echo normal LVEF, nuclear stress test LVEF low normal 50% hypertensive response to exercise, low risk study.  In early 4/19, he came back to the office reporting more chest discomfort.  He had several stressors and had some anxiety as well.  At that time, started Toprol-XL 25 mg once daily, and added Protonix 40 mg once daily with GI symptoms.        Past Medical History:  Diagnosis Date  . Chest pain   . Gastritis   . GERD (gastroesophageal reflux disease)   . Tobacco abuse     Past Surgical History:  Procedure Laterality Date  . TOOTH EXTRACTION       Current Outpatient Medications  Medication Sig Dispense Refill  . aspirin EC 81 MG tablet Take 81 mg by mouth daily.    Marland Kitchen atorvastatin (LIPITOR) 40 MG tablet Take 1 tablet (40 mg total) by mouth daily at 6 PM. 30 tablet 1  . hydrochlorothiazide (MICROZIDE) 12.5 MG capsule Take 1 capsule (12.5 mg total) by mouth daily. 30 capsule 0  . metoprolol succinate (TOPROL-XL) 25 MG 24 hr tablet Take 1 tablet (25 mg total) by mouth daily. Take with or immediately following a meal. 90 tablet 3  . Multiple Vitamin (MULTIVITAMIN WITH MINERALS) TABS tablet Take 1 tablet by mouth daily.    . pantoprazole (PROTONIX) 40 MG tablet Take 1 tablet (40 mg total) by mouth daily. 90 tablet  3   No current facility-administered medications for this visit.     Allergies:   Patient has no known allergies.    Social History:  The patient  reports that he has been smoking cigarettes.  He has been smoking about 0.25 packs per day. He has never used smokeless tobacco. He reports that he drinks alcohol. He reports that he does not use drugs.   Family History:  The patient's ***family history includes Heart attack in his brother; Hypertension in his father.    ROS:  Please see the history of present illness.   Otherwise, review of systems are positive for ***.   All other systems are reviewed and negative.    PHYSICAL EXAM: VS:  There were no vitals taken for this visit. , BMI There is no height or weight on file to calculate BMI. GEN: Well nourished, well developed, in no acute distress  HEENT: normal  Neck: no JVD, carotid bruits, or masses Cardiac: ***RRR; no murmurs, rubs, or gallops,no edema  Respiratory:  clear to auscultation bilaterally, normal work of breathing GI: soft, nontender, nondistended, + BS MS: no deformity or atrophy  Skin: warm and dry, no rash Neuro:  Strength and sensation are intact Psych: euthymic mood, full affect  EKG:   The ekg ordered today demonstrates ***   Recent Labs: 04/05/2017: BUN 16; Creatinine, Ser 1.07; Hemoglobin 13.5; Platelets 305; Potassium 3.7; Sodium 140   Lipid Panel    Component Value Date/Time   CHOL 200 04/06/2017 0539   TRIG 151 (H) 04/06/2017 0539   HDL 49 04/06/2017 0539   CHOLHDL 4.1 04/06/2017 0539   VLDL 30 04/06/2017 0539   LDLCALC 121 (H) 04/06/2017 0539   LDLDIRECT 130.6 11/25/2012 1540     Other studies Reviewed: Additional studies/ records that were reviewed today with results demonstrating: ***.   ASSESSMENT AND PLAN:  1. Atypical chest pain  2. HTN: 3. Tobacco abuse: 4. Family h/o ischemic heart disease   Current medicines are reviewed at length with the patient today.  The patient concerns  regarding his medicines were addressed.  The following changes have been made:  No change***  Labs/ tests ordered today include: *** No orders of the defined types were placed in this encounter.   Recommend 150 minutes/week of aerobic exercise Low fat, low carb, high fiber diet recommended  Disposition:   FU in ***   Signed, Larae Grooms, MD  05/26/2017 11:57 AM    [facility] [place], [place], [place]  [address] Phone: [phone]; Fax: [phone]

## 2017-05-27 ENCOUNTER — Ambulatory Visit: Payer: Managed Care, Other (non HMO) | Admitting: Interventional Cardiology

## 2017-08-05 ENCOUNTER — Encounter: Payer: Self-pay | Admitting: Family Medicine

## 2017-08-05 ENCOUNTER — Ambulatory Visit: Payer: Managed Care, Other (non HMO) | Admitting: Family Medicine

## 2017-08-05 ENCOUNTER — Other Ambulatory Visit: Payer: Self-pay

## 2017-08-05 VITALS — BP 138/88 | HR 80 | Temp 98.7°F | Ht 76.0 in | Wt 228.6 lb

## 2017-08-05 DIAGNOSIS — E785 Hyperlipidemia, unspecified: Secondary | ICD-10-CM | POA: Diagnosis not present

## 2017-08-05 DIAGNOSIS — F418 Other specified anxiety disorders: Secondary | ICD-10-CM | POA: Diagnosis not present

## 2017-08-05 DIAGNOSIS — R12 Heartburn: Secondary | ICD-10-CM

## 2017-08-05 DIAGNOSIS — I10 Essential (primary) hypertension: Secondary | ICD-10-CM | POA: Diagnosis not present

## 2017-08-05 DIAGNOSIS — Z720 Tobacco use: Secondary | ICD-10-CM

## 2017-08-05 DIAGNOSIS — Z87898 Personal history of other specified conditions: Secondary | ICD-10-CM

## 2017-08-05 MED ORDER — HYDROCHLOROTHIAZIDE 12.5 MG PO CAPS: 12.5000 mg | ORAL_CAPSULE | Freq: Every day | ORAL | 1 refills | Status: DC

## 2017-08-05 MED ORDER — ATORVASTATIN CALCIUM 40 MG PO TABS: 40.0000 mg | ORAL_TABLET | Freq: Every day | ORAL | 1 refills | Status: DC

## 2017-08-05 NOTE — Progress Notes (Addendum)
Subjective:  By signing my name below, I, Essence Howell, attest that this documentation has been prepared under the direction and in the presence of Wendie Agreste, MD Electronically Signed: Ladene Artist, ED Scribe 08/05/2017 at 2:22 PM.   Patient ID: Lee Krause, male    DOB: 02-26-1965, 52 y.o.   MRN: [id number]  Chief Complaint  Patient presents with  . Establish Care    has not taken any medication for 3 months    HPI Lee Krause is a 52 y.o. male who presents to Primary Care at [facility] to establish care. New pt to me. H/o HTN, lower GI bleed, family h/o ischemic heart disease.  Hospital records and cardiology notes were reviewed.   He was hospitalized 3/25-36 for cp, cardiac biomarkers were neg, EKG without acute ST changes. Cardiology was consulted. Recommended outpatient stress testing. Continued asa 81 mg, started HCTZ 12.5 for HTN, Lipitor 40 mg for hyperlipidemia. Echo during hospitalization with EF 55-60%, normal wall motion. Myoview stress test which was low risk 3/28, EF mildly reduced at 50 but overall appeared normal. Seen 4/3 at cardiology [facility]. He was still having some chest pressure at that visit and indigestion symptoms but anxiety as well with family health and new job stress. Toprol XL 25 mg was started and Protonix 40 mg. He quit smoking at that [place]. Planned for cardiology f/u in 1 month. - Pt reports brother had MI 6 yrs ago at age 41 and sev family members with HTN. He has not rescheduled appointment with cardiology yet. Pt last ate around 9:30 AM today.  HTN BP Readings from Last 3 Encounters:  08/05/17 138/88  04/14/17 120/72  04/06/17 (!) 139/93   Lab Results  Component Value Date   CREATININE 1.07 04/05/2017  Pt states that he stopped taking BP meds when he ran out, states he just never refilled it. He has been off all of his meds for ~3 months. Denies side-effects.  Hyperlipidemia Lab Results  Component Value Date   CHOL 200 04/06/2017     HDL 49 04/06/2017   LDLCALC 121 (H) 04/06/2017   LDLDIRECT 130.6 11/25/2012   TRIG 151 (H) 04/06/2017   CHOLHDL 4.1 04/06/2017   Lab Results  Component Value Date   ALT 21 05/22/2016   AST 23 05/22/2016   ALKPHOS 62 05/22/2016   BILITOT 0.5 05/22/2016  Denies myalgias, arthralgias, any side-effects.  Anxiety Pt reports increased anxiety x 3 yrs which he attributes to a "mid life crisis" or possible having a 9 y.o child and a 16 y.o. grandson. Denies prev h/o anxiety but states he has always been a high-strung person. He is currently treating anxiety by having 2 beers and 2 shots most days (6-10 drinks/wk) and napping. Denies SI, family h/o thyroid disease. Pt has never met with a counselor or therapist and states "I'd rather go to a pharmacy and talk to you about it". He walks 10-13 miles/day at work (works at [employer]).  L Knee Pain Pt mentioned chronic L knee pain but plans to schedule another appointment to discuss this further. States he has no cartilage in the L knee but has noticed that he is now starting to overcompensate and causing R knee pain.  H/o GI Bleed He is taking omeprazole. States he thinks recent cp in March was due to GERD and gastritis. States he was supposed to have endoscopy 2 yrs ago but never had one due to switching jobs. He  has been avoiding trigger foods and eating earlier in the night which he states helps him manage his symptoms. Denies blood in stools.  Patient Active Problem List   Diagnosis Date Noted  . Atypical chest pain 04/05/2017  . Essential hypertension 04/05/2017  . Hematochezia   . Lower GI bleed 05/22/2016  . Family history of ischemic heart disease 11/18/2012  . Tobacco use disorder 11/18/2012   Past Medical History:  Diagnosis Date  . Chest pain   . Gastritis   . GERD (gastroesophageal reflux disease)   . Tobacco abuse    Past Surgical History:  Procedure Laterality Date  . TOOTH EXTRACTION     No  Known Allergies Prior to Admission medications   Medication Sig Start Date End Date Taking? Authorizing Provider  aspirin EC 81 MG tablet Take 81 mg by mouth daily.    [provider]  atorvastatin (LIPITOR) 40 MG tablet Take 1 tablet (40 mg total) by mouth daily at 6 PM. 04/06/17   Purohit, Konrad Dolores, MD  hydrochlorothiazide (MICROZIDE) 12.5 MG capsule Take 1 capsule (12.5 mg total) by mouth daily. 04/07/17   Purohit, Konrad Dolores, MD  metoprolol succinate (TOPROL-XL) 25 MG 24 hr tablet Take 1 tablet (25 mg total) by mouth daily. Take with or immediately following a meal. 04/14/17   Imogene Burn, PA-C  Multiple Vitamin (MULTIVITAMIN WITH MINERALS) TABS tablet Take 1 tablet by mouth daily.    [provider]  pantoprazole (PROTONIX) 40 MG tablet Take 1 tablet (40 mg total) by mouth daily. 04/14/17   Imogene Burn, PA-C   Social History   Socioeconomic History  . Marital status: Married    Spouse name: Not on file  . Number of children: Not on file  . Years of education: Not on file  . Highest education level: Not on file  Occupational History  . Not on file  Social Needs  . Financial resource strain: Not on file  . Food insecurity:    Worry: Not on file    Inability: Not on file  . Transportation needs:    Medical: Not on file    Non-medical: Not on file  Tobacco Use  . Smoking status: Current Every Day Smoker    Packs/day: 0.25    Types: Cigarettes  . Smokeless tobacco: Never Used  Substance and Sexual Activity  . Alcohol use: Yes    Comment: Most days; after work  . Drug use: No  . Sexual activity: Yes  Lifestyle  . Physical activity:    Days per week: Not on file    Minutes per session: Not on file  . Stress: Not on file  Relationships  . Social connections:    Talks on phone: Not on file    Gets together: Not on file    Attends religious service: Not on file    Active member of club or organization: Not on file    Attends meetings of clubs or  organizations: Not on file    Relationship status: Not on file  . Intimate partner violence:    Fear of current or ex partner: Not on file    Emotionally abused: Not on file    Physically abused: Not on file    Forced sexual activity: Not on file  Other Topics Concern  . Not on file  Social History Narrative  . Not on file   Review of Systems  Constitutional: Negative for fatigue and unexpected weight change.  Eyes: Negative  for visual disturbance.  Respiratory: Negative for cough, chest tightness and shortness of breath.   Cardiovascular: Negative for chest pain, palpitations and leg swelling.  Gastrointestinal: Negative for abdominal pain and blood in stool.  Musculoskeletal: Positive for arthralgias (L knee pain). Negative for myalgias.  Neurological: Negative for dizziness, light-headedness and headaches.  Psychiatric/Behavioral: Negative for sleep disturbance. The patient is nervous/anxious.       Objective:   Physical Exam  Constitutional: He is oriented to person, place, and time. He appears well-developed and well-nourished.  HENT:  Head: Normocephalic and atraumatic.  Eyes: Pupils are equal, round, and reactive to light. EOM are normal.  Neck: No JVD present. Carotid bruit is not present.  Cardiovascular: Normal rate, regular rhythm and normal heart sounds.  No murmur heard. Pulmonary/Chest: Effort normal and breath sounds normal. He has no rales.  Musculoskeletal: He exhibits no edema.  Neurological: He is alert and oriented to person, place, and time.  Skin: Skin is warm and dry.  Psychiatric: He has a normal mood and affect.  Vitals reviewed.  Vitals:   08/05/17 1407 08/05/17 1409  BP: (!) 154/98 138/88  Pulse: 80   Temp: 98.7 F (37.1 C)   TempSrc: Oral   SpO2: 99%   Weight: 228 lb 9.6 oz (103.7 kg)   Height: 6' 4"  (1.93 m)       Assessment & Plan:    Lee Krause is a 52 y.o. male Essential hypertension - Plan: hydrochlorothiazide (MICROZIDE)  12.5 MG capsule, DISCONTINUED: hydrochlorothiazide (MICROZIDE) 12.5 MG capsule  -Restart HCTZ.  Also recommended cutting back on alcohol to no more than 1 drink per day.  Recheck next few weeks.  Tobacco abuse  -Cessation discussed  Hyperlipidemia, unspecified hyperlipidemia type - Plan: atorvastatin (LIPITOR) 40 MG tablet, Comprehensive metabolic panel, Lipid panel  -Restart Lipitor, check baseline CMP and lipid panel, but likely will need also be repeated in 6 to 8 weeks on medication  Situational anxiety - Plan: TSH  -Information provided on stress and stress management, will check TSH.  Counseling discussed but declined at present.  Can discuss further at follow-up visit.  Heartburn - Plan: Ambulatory referral to Gastroenterology  -Continue omeprazole, referral placed back to previous gastroenterologist  History of chest pain  -Work-up as above was overall reassuring, asymptomatic at present.  Meds ordered this encounter  Medications  . DISCONTD: hydrochlorothiazide (MICROZIDE) 12.5 MG capsule    Sig: Take 1 capsule (12.5 mg total) by mouth daily.    Dispense:  90 capsule    Refill:  1  . atorvastatin (LIPITOR) 40 MG tablet    Sig: Take 1 tablet (40 mg total) by mouth daily at 6 PM.    Dispense:  90 tablet    Refill:  1  . hydrochlorothiazide (MICROZIDE) 12.5 MG capsule    Sig: Take 1 capsule (12.5 mg total) by mouth daily.    Dispense:  90 capsule    Refill:  1   Patient Instructions   I will restart the HCTZ once per day. Cut back on alcohol to no more than 1 drink per day for now. Please call your cardiologist as it appears they wanted you to follow up in 1 month.   For stress, see other approaches on treating that more effectively below. I would like to talk about that further at your next visit. Return to the clinic or go to the nearest emergency room if any of your symptoms worsen or new symptoms occur.  Continue omeprazole  daily, see information on diet for  heartburn. I will refer you back to Dr. Henrene Pastor.   Restart cholesterol medication.  I will check some labs, but those may need to be repeated on medication.   Follow up in next 3 weeks.   Stress and Stress Management Stress is a normal reaction to life events. It is what you feel when life demands more than you are used to or more than you can handle. Some stress can be useful. For example, the stress reaction can help you catch the last bus of the day, study for a test, or meet a deadline at work. But stress that occurs too often or for too long can cause problems. It can affect your emotional health and interfere with relationships and normal daily activities. Too much stress can weaken your immune system and increase your risk for physical illness. If you already have a medical problem, stress can make it worse. What are the causes? All sorts of life events may cause stress. An event that causes stress for one person may not be stressful for another person. Major life events commonly cause stress. These may be positive or negative. Examples include losing your job, moving into a new home, getting married, having a baby, or losing a loved one. Less obvious life events may also cause stress, especially if they occur day after day or in combination. Examples include working long hours, driving in traffic, caring for children, being in debt, or being in a difficult relationship. What are the signs or symptoms? Stress may cause emotional symptoms including, the following:  Anxiety. This is feeling worried, afraid, on edge, overwhelmed, or out of control.  Anger. This is feeling irritated or impatient.  Depression. This is feeling sad, down, helpless, or guilty.  Difficulty focusing, remembering, or making decisions.  Stress may cause physical symptoms, including the following:  Aches and pains. These may affect your head, neck, back, stomach, or other areas of your body.  Tight muscles or  clenched jaw.  Low energy or trouble sleeping.  Stress may cause unhealthy behaviors, including the following:  Eating to feel better (overeating) or skipping meals.  Sleeping too little, too much, or both.  Working too much or putting off tasks (procrastination).  Smoking, drinking alcohol, or using drugs to feel better.  How is this diagnosed? Stress is diagnosed through an assessment by your health care provider. Your health care provider will ask questions about your symptoms and any stressful life events.Your health care provider will also ask about your medical history and may order blood tests or other tests. Certain medical conditions and medicine can cause physical symptoms similar to stress. Mental illness can cause emotional symptoms and unhealthy behaviors similar to stress. Your health care provider may refer you to a mental health professional for further evaluation. How is this treated? Stress management is the recommended treatment for stress.The goals of stress management are reducing stressful life events and coping with stress in healthy ways. Techniques for reducing stressful life events include the following:  Stress identification. Self-monitor for stress and identify what causes stress for you. These skills may help you to avoid some stressful events.  Time management. Set your priorities, keep a calendar of events, and learn to say "no." These tools can help you avoid making too many commitments.  Techniques for coping with stress include the following:  Rethinking the problem. Try to think realistically about stressful events rather than ignoring them or overreacting. Try to find  the positives in a stressful situation rather than focusing on the negatives.  Exercise. Physical exercise can release both physical and emotional tension. The key is to find a form of exercise you enjoy and do it regularly.  Relaxation techniques. These relax the body and mind.  Examples include yoga, meditation, tai chi, biofeedback, deep breathing, progressive muscle relaxation, listening to music, being out in nature, journaling, and other hobbies. Again, the key is to find one or more that you enjoy and can do regularly.  Healthy lifestyle. Eat a balanced diet, get plenty of sleep, and do not smoke. Avoid using alcohol or drugs to relax.  Strong support network. Spend time with family, friends, or other people you enjoy being around.Express your feelings and talk things over with someone you trust.  Counseling or talktherapy with a mental health professional may be helpful if you are having difficulty managing stress on your own. Medicine is typically not recommended for the treatment of stress.Talk to your health care provider if you think you need medicine for symptoms of stress. Follow these instructions at home:  Keep all follow-up visits as directed by your health care provider.  Take all medicines as directed by your health care provider. Contact a health care provider if:  Your symptoms get worse or you start having new symptoms.  You feel overwhelmed by your problems and can no longer manage them on your own. Get help right away if:  You feel like hurting yourself or someone else. This information is not intended to replace advice given to you by your health care provider. Make sure you discuss any questions you have with your health care provider. Document Released: 06/24/2000 Document Revised: 06/06/2015 Document Reviewed: 08/23/2012 Elsevier Interactive Patient Education  2017 [employer].    Hypertension Hypertension, commonly called high blood pressure, is when the force of blood pumping through the arteries is too strong. The arteries are the blood vessels that carry blood from the heart throughout the body. Hypertension forces the heart to work harder to pump blood and may cause arteries to become narrow or stiff. Having untreated or  uncontrolled hypertension can cause heart attacks, strokes, kidney disease, and other problems. A blood pressure reading consists of a higher number over a lower number. Ideally, your blood pressure should be below 120/80. The first ("top") number is called the systolic pressure. It is a measure of the pressure in your arteries as your heart beats. The second ("bottom") number is called the diastolic pressure. It is a measure of the pressure in your arteries as the heart relaxes. What are the causes? The cause of this condition is not known. What increases the risk? Some risk factors for high blood pressure are under your control. Others are not. Factors you can change  Smoking.  Having type 2 diabetes mellitus, high cholesterol, or both.  Not getting enough exercise or physical activity.  Being overweight.  Having too much fat, sugar, calories, or salt (sodium) in your diet.  Drinking too much alcohol. Factors that are difficult or impossible to change  Having chronic kidney disease.  Having a family history of high blood pressure.  Age. Risk increases with age.  Race. You may be at higher risk if you are African-American.  Gender. Men are at higher risk than women before age 56. After age 54, women are at higher risk than men.  Having obstructive sleep apnea.  Stress. What are the signs or symptoms? Extremely high blood pressure (hypertensive crisis) may  cause:  Headache.  Anxiety.  Shortness of breath.  Nosebleed.  Nausea and vomiting.  Severe chest pain.  Jerky movements you cannot control (seizures).  How is this diagnosed? This condition is diagnosed by measuring your blood pressure while you are seated, with your arm resting on a surface. The cuff of the blood pressure monitor will be placed directly against the skin of your upper arm at the level of your heart. It should be measured at least twice using the same arm. Certain conditions can cause a  difference in blood pressure between your right and left arms. Certain factors can cause blood pressure readings to be lower or higher than normal (elevated) for a short period of time:  When your blood pressure is higher when you are in a health care provider's office than when you are at home, this is called white coat hypertension. Most people with this condition do not need medicines.  When your blood pressure is higher at home than when you are in a health care provider's office, this is called masked hypertension. Most people with this condition may need medicines to control blood pressure.  If you have a high blood pressure reading during one visit or you have normal blood pressure with other risk factors:  You may be asked to return on a different day to have your blood pressure checked again.  You may be asked to monitor your blood pressure at home for 1 week or longer.  If you are diagnosed with hypertension, you may have other blood or imaging tests to help your health care provider understand your overall risk for other conditions. How is this treated? This condition is treated by making healthy lifestyle changes, such as eating healthy foods, exercising more, and reducing your alcohol intake. Your health care provider may prescribe medicine if lifestyle changes are not enough to get your blood pressure under control, and if:  Your systolic blood pressure is above 130.  Your diastolic blood pressure is above 80.  Your personal target blood pressure may vary depending on your medical conditions, your age, and other factors. Follow these instructions at home: Eating and drinking  Eat a diet that is high in fiber and potassium, and low in sodium, added sugar, and fat. An example eating plan is called the DASH (Dietary Approaches to Stop Hypertension) diet. To eat this way: ? Eat plenty of fresh fruits and vegetables. Try to fill half of your plate at each meal with fruits and  vegetables. ? Eat whole grains, such as whole wheat pasta, brown rice, or whole grain bread. Fill about one quarter of your plate with whole grains. ? Eat or drink low-fat dairy products, such as skim milk or low-fat yogurt. ? Avoid fatty cuts of meat, processed or cured meats, and poultry with skin. Fill about one quarter of your plate with lean proteins, such as fish, chicken without skin, beans, eggs, and tofu. ? Avoid premade and processed foods. These tend to be higher in sodium, added sugar, and fat.  Reduce your daily sodium intake. Most people with hypertension should eat less than 1,500 mg of sodium a day.  Limit alcohol intake to no more than 1 drink a day for nonpregnant women and 2 drinks a day for men. One drink equals 12 oz of beer, 5 oz of wine, or 1 oz of hard liquor. Lifestyle  Work with your health care provider to maintain a healthy body weight or to lose weight. Ask what an  ideal weight is for you.  Get at least 30 minutes of exercise that causes your heart to beat faster (aerobic exercise) most days of the week. Activities may include walking, swimming, or biking.  Include exercise to strengthen your muscles (resistance exercise), such as pilates or lifting weights, as part of your weekly exercise routine. Try to do these types of exercises for 30 minutes at least 3 days a week.  Do not use any products that contain nicotine or tobacco, such as cigarettes and e-cigarettes. If you need help quitting, ask your health care provider.  Monitor your blood pressure at home as told by your health care provider.  Keep all follow-up visits as told by your health care provider. This is important. Medicines  Take over-the-counter and prescription medicines only as told by your health care provider. Follow directions carefully. Blood pressure medicines must be taken as prescribed.  Do not skip doses of blood pressure medicine. Doing this puts you at risk for problems and can make  the medicine less effective.  Ask your health care provider about side effects or reactions to medicines that you should watch for. Contact a health care provider if:  You think you are having a reaction to a medicine you are taking.  You have headaches that keep coming back (recurring).  You feel dizzy.  You have swelling in your ankles.  You have trouble with your vision. Get help right away if:  You develop a severe headache or confusion.  You have unusual weakness or numbness.  You feel faint.  You have severe pain in your chest or abdomen.  You vomit repeatedly.  You have trouble breathing. Summary  Hypertension is when the force of blood pumping through your arteries is too strong. If this condition is not controlled, it may put you at risk for serious complications.  Your personal target blood pressure may vary depending on your medical conditions, your age, and other factors. For most people, a normal blood pressure is less than 120/80.  Hypertension is treated with lifestyle changes, medicines, or a combination of both. Lifestyle changes include weight loss, eating a healthy, low-sodium diet, exercising more, and limiting alcohol. This information is not intended to replace advice given to you by your health care provider. Make sure you discuss any questions you have with your health care provider. Document Released: 12/29/2004 Document Revised: 11/27/2015 Document Reviewed: 11/27/2015 Elsevier Interactive Patient Education  2018 [employer].   IF you received an x-ray today, you will receive an invoice from [facility]. Please contact [facility] at [phone] with questions or concerns regarding your invoice.   IF you received labwork today, you will receive an invoice from [facility]. Please contact [facility] at [phone] with questions or concerns regarding your invoice.   Our billing staff will not be able to assist you with questions  regarding bills from these companies.  You will be contacted with the lab results as soon as they are available. The fastest way to get your results is to activate your My Chart account. Instructions are located on the last page of this paperwork. If you have not heard from [place] regarding the results in 2 weeks, please contact this office.       I personally performed the services described in this documentation, which was scribed in my presence. The recorded information has been reviewed and considered for accuracy and completeness, addended by me as needed, and agree with information above.  Signed,   Merri Ray,  MD [facility] at [facility].  08/05/17 4:06 PM

## 2017-08-05 NOTE — Patient Instructions (Addendum)
I will restart the HCTZ once per day. Cut back on alcohol to no more than 1 drink per day for now. Please call your cardiologist as it appears they wanted you to follow up in 1 month.   For stress, see other approaches on treating that more effectively below. I would like to talk about that further at your next visit. Return to the clinic or go to the nearest emergency room if any of your symptoms worsen or new symptoms occur.  Continue omeprazole daily, see information on diet for heartburn. I will refer you back to Dr. Henrene Pastor.   Restart cholesterol medication.  I will check some labs, but those may need to be repeated on medication.   Follow up in next 3 weeks.   Stress and Stress Management Stress is a normal reaction to life events. It is what you feel when life demands more than you are used to or more than you can handle. Some stress can be useful. For example, the stress reaction can help you catch the last bus of the day, study for a test, or meet a deadline at work. But stress that occurs too often or for too long can cause problems. It can affect your emotional health and interfere with relationships and normal daily activities. Too much stress can weaken your immune system and increase your risk for physical illness. If you already have a medical problem, stress can make it worse. What are the causes? All sorts of life events may cause stress. An event that causes stress for one person may not be stressful for another person. Major life events commonly cause stress. These may be positive or negative. Examples include losing your job, moving into a new home, getting married, having a baby, or losing a loved one. Less obvious life events may also cause stress, especially if they occur day after day or in combination. Examples include working long hours, driving in traffic, caring for children, being in debt, or being in a difficult relationship. What are the signs or symptoms? Stress may cause  emotional symptoms including, the following:  Anxiety. This is feeling worried, afraid, on edge, overwhelmed, or out of control.  Anger. This is feeling irritated or impatient.  Depression. This is feeling sad, down, helpless, or guilty.  Difficulty focusing, remembering, or making decisions.  Stress may cause physical symptoms, including the following:  Aches and pains. These may affect your head, neck, back, stomach, or other areas of your body.  Tight muscles or clenched jaw.  Low energy or trouble sleeping.  Stress may cause unhealthy behaviors, including the following:  Eating to feel better (overeating) or skipping meals.  Sleeping too little, too much, or both.  Working too much or putting off tasks (procrastination).  Smoking, drinking alcohol, or using drugs to feel better.  How is this diagnosed? Stress is diagnosed through an assessment by your health care provider. Your health care provider will ask questions about your symptoms and any stressful life events.Your health care provider will also ask about your medical history and may order blood tests or other tests. Certain medical conditions and medicine can cause physical symptoms similar to stress. Mental illness can cause emotional symptoms and unhealthy behaviors similar to stress. Your health care provider may refer you to a mental health professional for further evaluation. How is this treated? Stress management is the recommended treatment for stress.The goals of stress management are reducing stressful life events and coping with stress in healthy ways. Techniques  for reducing stressful life events include the following:  Stress identification. Self-monitor for stress and identify what causes stress for you. These skills may help you to avoid some stressful events.  Time management. Set your priorities, keep a calendar of events, and learn to say "no." These tools can help you avoid making too many  commitments.  Techniques for coping with stress include the following:  Rethinking the problem. Try to think realistically about stressful events rather than ignoring them or overreacting. Try to find the positives in a stressful situation rather than focusing on the negatives.  Exercise. Physical exercise can release both physical and emotional tension. The key is to find a form of exercise you enjoy and do it regularly.  Relaxation techniques. These relax the body and mind. Examples include yoga, meditation, tai chi, biofeedback, deep breathing, progressive muscle relaxation, listening to music, being out in nature, journaling, and other hobbies. Again, the key is to find one or more that you enjoy and can do regularly.  Healthy lifestyle. Eat a balanced diet, get plenty of sleep, and do not smoke. Avoid using alcohol or drugs to relax.  Strong support network. Spend time with family, friends, or other people you enjoy being around.Express your feelings and talk things over with someone you trust.  Counseling or talktherapy with a mental health professional may be helpful if you are having difficulty managing stress on your own. Medicine is typically not recommended for the treatment of stress.Talk to your health care provider if you think you need medicine for symptoms of stress. Follow these instructions at home:  Keep all follow-up visits as directed by your health care provider.  Take all medicines as directed by your health care provider. Contact a health care provider if:  Your symptoms get worse or you start having new symptoms.  You feel overwhelmed by your problems and can no longer manage them on your own. Get help right away if:  You feel like hurting yourself or someone else. This information is not intended to replace advice given to you by your health care provider. Make sure you discuss any questions you have with your health care provider. Document Released:  06/24/2000 Document Revised: 06/06/2015 Document Reviewed: 08/23/2012 Elsevier Interactive Patient Education  2017 [employer].    Hypertension Hypertension, commonly called high blood pressure, is when the force of blood pumping through the arteries is too strong. The arteries are the blood vessels that carry blood from the heart throughout the body. Hypertension forces the heart to work harder to pump blood and may cause arteries to become narrow or stiff. Having untreated or uncontrolled hypertension can cause heart attacks, strokes, kidney disease, and other problems. A blood pressure reading consists of a higher number over a lower number. Ideally, your blood pressure should be below 120/80. The first ("top") number is called the systolic pressure. It is a measure of the pressure in your arteries as your heart beats. The second ("bottom") number is called the diastolic pressure. It is a measure of the pressure in your arteries as the heart relaxes. What are the causes? The cause of this condition is not known. What increases the risk? Some risk factors for high blood pressure are under your control. Others are not. Factors you can change  Smoking.  Having type 2 diabetes mellitus, high cholesterol, or both.  Not getting enough exercise or physical activity.  Being overweight.  Having too much fat, sugar, calories, or salt (sodium) in your diet.  Drinking too much alcohol. Factors that are difficult or impossible to change  Having chronic kidney disease.  Having a family history of high blood pressure.  Age. Risk increases with age.  Race. You may be at higher risk if you are African-American.  Gender. Men are at higher risk than women before age 96. After age 22, women are at higher risk than men.  Having obstructive sleep apnea.  Stress. What are the signs or symptoms? Extremely high blood pressure (hypertensive crisis) may cause:  Headache.  Anxiety.  Shortness  of breath.  Nosebleed.  Nausea and vomiting.  Severe chest pain.  Jerky movements you cannot control (seizures).  How is this diagnosed? This condition is diagnosed by measuring your blood pressure while you are seated, with your arm resting on a surface. The cuff of the blood pressure monitor will be placed directly against the skin of your upper arm at the level of your heart. It should be measured at least twice using the same arm. Certain conditions can cause a difference in blood pressure between your right and left arms. Certain factors can cause blood pressure readings to be lower or higher than normal (elevated) for a short period of time:  When your blood pressure is higher when you are in a health care provider's office than when you are at home, this is called white coat hypertension. Most people with this condition do not need medicines.  When your blood pressure is higher at home than when you are in a health care provider's office, this is called masked hypertension. Most people with this condition may need medicines to control blood pressure.  If you have a high blood pressure reading during one visit or you have normal blood pressure with other risk factors:  You may be asked to return on a different day to have your blood pressure checked again.  You may be asked to monitor your blood pressure at home for 1 week or longer.  If you are diagnosed with hypertension, you may have other blood or imaging tests to help your health care provider understand your overall risk for other conditions. How is this treated? This condition is treated by making healthy lifestyle changes, such as eating healthy foods, exercising more, and reducing your alcohol intake. Your health care provider may prescribe medicine if lifestyle changes are not enough to get your blood pressure under control, and if:  Your systolic blood pressure is above 130.  Your diastolic blood pressure is above  80.  Your personal target blood pressure may vary depending on your medical conditions, your age, and other factors. Follow these instructions at home: Eating and drinking  Eat a diet that is high in fiber and potassium, and low in sodium, added sugar, and fat. An example eating plan is called the DASH (Dietary Approaches to Stop Hypertension) diet. To eat this way: ? Eat plenty of fresh fruits and vegetables. Try to fill half of your plate at each meal with fruits and vegetables. ? Eat whole grains, such as whole wheat pasta, brown rice, or whole grain bread. Fill about one quarter of your plate with whole grains. ? Eat or drink low-fat dairy products, such as skim milk or low-fat yogurt. ? Avoid fatty cuts of meat, processed or cured meats, and poultry with skin. Fill about one quarter of your plate with lean proteins, such as fish, chicken without skin, beans, eggs, and tofu. ? Avoid premade and processed foods. These tend to be higher  in sodium, added sugar, and fat.  Reduce your daily sodium intake. Most people with hypertension should eat less than 1,500 mg of sodium a day.  Limit alcohol intake to no more than 1 drink a day for nonpregnant women and 2 drinks a day for men. One drink equals 12 oz of beer, 5 oz of wine, or 1 oz of hard liquor. Lifestyle  Work with your health care provider to maintain a healthy body weight or to lose weight. Ask what an ideal weight is for you.  Get at least 30 minutes of exercise that causes your heart to beat faster (aerobic exercise) most days of the week. Activities may include walking, swimming, or biking.  Include exercise to strengthen your muscles (resistance exercise), such as pilates or lifting weights, as part of your weekly exercise routine. Try to do these types of exercises for 30 minutes at least 3 days a week.  Do not use any products that contain nicotine or tobacco, such as cigarettes and e-cigarettes. If you need help quitting, ask  your health care provider.  Monitor your blood pressure at home as told by your health care provider.  Keep all follow-up visits as told by your health care provider. This is important. Medicines  Take over-the-counter and prescription medicines only as told by your health care provider. Follow directions carefully. Blood pressure medicines must be taken as prescribed.  Do not skip doses of blood pressure medicine. Doing this puts you at risk for problems and can make the medicine less effective.  Ask your health care provider about side effects or reactions to medicines that you should watch for. Contact a health care provider if:  You think you are having a reaction to a medicine you are taking.  You have headaches that keep coming back (recurring).  You feel dizzy.  You have swelling in your ankles.  You have trouble with your vision. Get help right away if:  You develop a severe headache or confusion.  You have unusual weakness or numbness.  You feel faint.  You have severe pain in your chest or abdomen.  You vomit repeatedly.  You have trouble breathing. Summary  Hypertension is when the force of blood pumping through your arteries is too strong. If this condition is not controlled, it may put you at risk for serious complications.  Your personal target blood pressure may vary depending on your medical conditions, your age, and other factors. For most people, a normal blood pressure is less than 120/80.  Hypertension is treated with lifestyle changes, medicines, or a combination of both. Lifestyle changes include weight loss, eating a healthy, low-sodium diet, exercising more, and limiting alcohol. This information is not intended to replace advice given to you by your health care provider. Make sure you discuss any questions you have with your health care provider. Document Released: 12/29/2004 Document Revised: 11/27/2015 Document Reviewed: 11/27/2015 Elsevier  Interactive Patient Education  2018 [employer].   IF you received an x-ray today, you will receive an invoice from [facility]. Please contact [facility] at [phone] with questions or concerns regarding your invoice.   IF you received labwork today, you will receive an invoice from [facility]. Please contact [facility] at [phone] with questions or concerns regarding your invoice.   Our billing staff will not be able to assist you with questions regarding bills from these companies.  You will be contacted with the lab results as soon as they are available. The fastest way to  get your results is to activate your My Chart account. Instructions are located on the last page of this paperwork. If you have not heard from [place] regarding the results in 2 weeks, please contact this office.

## 2017-08-06 LAB — COMPREHENSIVE METABOLIC PANEL
A/G RATIO: 1.8 (ref 1.2–2.2)
ALK PHOS: 58 IU/L (ref 39–117)
ALT: 17 IU/L (ref 0–44)
AST: 21 IU/L (ref 0–40)
Albumin: 4.6 g/dL (ref 3.5–5.5)
BUN/Creatinine Ratio: 16 (ref 9–20)
BUN: 16 mg/dL (ref 6–24)
Bilirubin Total: 0.4 mg/dL (ref 0.0–1.2)
CO2: 23 mmol/L (ref 20–29)
CREATININE: 1.03 mg/dL (ref 0.76–1.27)
Calcium: 9.7 mg/dL (ref 8.7–10.2)
Chloride: 104 mmol/L (ref 96–106)
GFR calc Af Amer: 98 mL/min/{1.73_m2} (ref >59)
GFR calc non Af Amer: 85 mL/min/{1.73_m2} (ref >59)
GLOBULIN, TOTAL: 2.6 g/dL (ref 1.5–4.5)
Glucose: 95 mg/dL (ref 65–99)
Potassium: 4.4 mmol/L (ref 3.5–5.2)
Sodium: 142 mmol/L (ref 134–144)
Total Protein: 7.2 g/dL (ref 6.0–8.5)

## 2017-08-06 LAB — LIPID PANEL
CHOLESTEROL TOTAL: 233 mg/dL — AB (ref 100–199)
Chol/HDL Ratio: 4.1 ratio (ref 0.0–5.0)
HDL: 57 mg/dL (ref >39)
LDL CALC: 119 mg/dL — AB (ref 0–99)
TRIGLYCERIDES: 285 mg/dL — AB (ref 0–149)
VLDL Cholesterol Cal: 57 mg/dL — ABNORMAL HIGH (ref 5–40)

## 2017-08-06 LAB — TSH: TSH: 1.73 u[IU]/mL (ref 0.450–4.500)

## 2017-08-24 ENCOUNTER — Encounter: Payer: Self-pay | Admitting: Radiology

## 2017-08-26 ENCOUNTER — Ambulatory Visit: Payer: Managed Care, Other (non HMO) | Admitting: Family Medicine

## 2017-09-09 ENCOUNTER — Other Ambulatory Visit: Payer: Self-pay

## 2017-09-09 ENCOUNTER — Ambulatory Visit (INDEPENDENT_AMBULATORY_CARE_PROVIDER_SITE_OTHER): Payer: Managed Care, Other (non HMO)

## 2017-09-09 ENCOUNTER — Encounter (HOSPITAL_COMMUNITY): Payer: Self-pay | Admitting: Emergency Medicine

## 2017-09-09 ENCOUNTER — Encounter: Payer: Self-pay | Admitting: Family Medicine

## 2017-09-09 ENCOUNTER — Encounter: Payer: Self-pay | Admitting: Internal Medicine

## 2017-09-09 ENCOUNTER — Emergency Department (HOSPITAL_COMMUNITY)
Admission: EM | Admit: 2017-09-09 | Discharge: 2017-09-09 | Discharge status: Against Medical Advice | Payer: Managed Care, Other (non HMO) | Attending: Emergency Medicine | Admitting: Emergency Medicine

## 2017-09-09 ENCOUNTER — Ambulatory Visit: Payer: Managed Care, Other (non HMO) | Admitting: Family Medicine

## 2017-09-09 VITALS — BP 134/90 | HR 77 | Temp 98.9°F | Ht 76.0 in | Wt 227.0 lb

## 2017-09-09 DIAGNOSIS — R079 Chest pain, unspecified: Secondary | ICD-10-CM

## 2017-09-09 DIAGNOSIS — R0600 Dyspnea, unspecified: Secondary | ICD-10-CM

## 2017-09-09 DIAGNOSIS — Z5321 Procedure and treatment not carried out due to patient leaving prior to being seen by health care provider: Secondary | ICD-10-CM | POA: Diagnosis not present

## 2017-09-09 LAB — BASIC METABOLIC PANEL
ANION GAP: 9 (ref 5–15)
BUN: 16 mg/dL (ref 6–20)
CALCIUM: 9.4 mg/dL (ref 8.9–10.3)
CO2: 27 mmol/L (ref 22–32)
CREATININE: 0.99 mg/dL (ref 0.61–1.24)
Chloride: 103 mmol/L (ref 98–111)
Glucose, Bld: 96 mg/dL (ref 70–99)
Potassium: 3.7 mmol/L (ref 3.5–5.1)
SODIUM: 139 mmol/L (ref 135–145)

## 2017-09-09 LAB — CBC
HCT: 40.9 % (ref 39.0–52.0)
HEMOGLOBIN: 14 g/dL (ref 13.0–17.0)
MCH: 30.7 pg (ref 26.0–34.0)
MCHC: 34.2 g/dL (ref 30.0–36.0)
MCV: 89.7 fL (ref 78.0–100.0)
PLATELETS: 277 10*3/uL (ref 150–400)
RBC: 4.56 MIL/uL (ref 4.22–5.81)
RDW: 11.8 % (ref 11.5–15.5)
WBC: 11.2 10*3/uL — AB (ref 4.0–10.5)

## 2017-09-09 LAB — I-STAT TROPONIN, ED: TROPONIN I, POC: 0.01 ng/mL (ref 0.00–0.08)

## 2017-09-09 MED ORDER — GI COCKTAIL ~~LOC~~
30.0000 mL | Freq: Once | ORAL | Status: AC
  Administered 2017-09-09: 30 mL via ORAL

## 2017-09-09 NOTE — Patient Instructions (Addendum)
Although some improvement with GI cocktail, I recommend further evaluation in the emergency room as symptoms have persisted.  If you have any change in symptoms on the way to the ER, pull over and call 911.    Nonspecific Chest Pain Chest pain can be caused by many different conditions. There is always a chance that your pain could be related to something serious, such as a heart attack or a blood clot in your lungs. Chest pain can also be caused by conditions that are not life-threatening. If you have chest pain, it is very important to follow up with your health care provider. What are the causes? Causes of this condition include:  Heartburn.  Pneumonia or bronchitis.  Anxiety or stress.  Inflammation around your heart (pericarditis) or lung (pleuritis or pleurisy).  A blood clot in your lung.  A collapsed lung (pneumothorax). This can develop suddenly on its own (spontaneous pneumothorax) or from trauma to the chest.  Shingles infection (varicella-zoster virus).  Heart attack.  Damage to the bones, muscles, and cartilage that make up your chest wall. This can include: ? Bruised bones due to injury. ? Strained muscles or cartilage due to frequent or repeated coughing or overwork. ? Fracture to one or more ribs. ? Sore cartilage due to inflammation (costochondritis).  What increases the risk? Risk factors for this condition may include:  Activities that increase your risk for trauma or injury to your chest.  Respiratory infections or conditions that cause frequent coughing.  Medical conditions or overeating that can cause heartburn.  Heart disease or family history of heart disease.  Conditions or health behaviors that increase your risk of developing a blood clot.  Having had chicken pox (varicella zoster).  What are the signs or symptoms? Chest pain can feel like:  Burning or tingling on the surface of your chest or deep in your chest.  Crushing, pressure,  aching, or squeezing pain.  Dull or sharp pain that is worse when you move, cough, or take a deep breath.  Pain that is also felt in your back, neck, shoulder, or arm, or pain that spreads to any of these areas.  Your chest pain may come and go, or it may stay constant. How is this diagnosed? Lab tests or other studies may be needed to find the cause of your pain. Your health care provider may have you take a test called an ECG (electrocardiogram). An ECG records your heartbeat patterns at the time the test is performed. You may also have other tests, such as:  Transthoracic echocardiogram (TTE). In this test, sound waves are used to create a picture of the heart structures and to look at how blood flows through your heart.  Transesophageal echocardiogram (TEE).This is a more advanced imaging test that takes images from inside your body. It allows your health care provider to see your heart in finer detail.  Cardiac monitoring. This allows your health care provider to monitor your heart rate and rhythm in real time.  Holter monitor. This is a portable device that records your heartbeat and can help to diagnose abnormal heartbeats. It allows your health care provider to track your heart activity for several days, if needed.  Stress tests. These can be done through exercise or by taking medicine that makes your heart beat more quickly.  Blood tests.  Other imaging tests.  How is this treated? Treatment depends on what is causing your chest pain. Treatment may include:  Medicines. These may include: ?  Acid blockers for heartburn. ? Anti-inflammatory medicine. ? Pain medicine for inflammatory conditions. ? Antibiotic medicine, if an infection is present. ? Medicines to dissolve blood clots. ? Medicines to treat coronary artery disease (CAD).  Supportive care for conditions that do not require medicines. This may include: ? Resting. ? Applying heat or cold packs to injured  areas. ? Limiting activities until pain decreases.  Follow these instructions at home: Medicines  If you were prescribed an antibiotic, take it as told by your health care provider. Do not stop taking the antibiotic even if you start to feel better.  Take over-the-counter and prescription medicines only as told by your health care provider. Lifestyle  Do not use any products that contain nicotine or tobacco, such as cigarettes and e-cigarettes. If you need help quitting, ask your health care provider.  Do not drink alcohol.  Make lifestyle changes as directed by your health care provider. These may include: ? Getting regular exercise. Ask your health care provider to suggest some activities that are safe for you. ? Eating a heart-healthy diet. A registered dietitian can help you to learn healthy eating options. ? Maintaining a healthy weight. ? Managing diabetes, if necessary. ? Reducing stress, such as with yoga or relaxation techniques. General instructions  Avoid any activities that bring on chest pain.  If heartburn is the cause for your chest pain, raise (elevate) the head of your bed about 6 inches (15 cm) by putting blocks under the legs. Sleeping with more pillows does not effectively relieve heartburn because it only changes the position of your head.  Keep all follow-up visits as told by your health care provider. This is important. This includes any further testing if your chest pain does not go away. Contact a health care provider if:  Your chest pain does not go away.  You have a rash with blisters on your chest.  You have a fever.  You have chills. Get help right away if:  Your chest pain is worse.  You have a cough that gets worse, or you cough up blood.  You have severe pain in your abdomen.  You have severe weakness.  You faint.  You have sudden, unexplained chest discomfort.  You have sudden, unexplained discomfort in your arms, back, neck, or  jaw.  You have shortness of breath at any time.  You suddenly start to sweat, or your skin gets clammy.  You feel nauseous or you vomit.  You suddenly feel light-headed or dizzy.  Your heart begins to beat quickly, or it feels like it is skipping beats. These symptoms may represent a serious problem that is an emergency. Do not wait to see if the symptoms will go away. Get medical help right away. Call your local emergency services (911 in the [place] Do not drive yourself to the hospital. This information is not intended to replace advice given to you by your health care provider. Make sure you discuss any questions you have with your health care provider. Document Released: 10/08/2004 Document Revised: 09/23/2015 Document Reviewed: 09/23/2015 Elsevier Interactive Patient Education  [employer].   If you have lab work done today you will be contacted with your lab results within the next 2 weeks.  If you have not heard from [place] then please contact us. The fastest way to get your results is to register for My Chart.   IF you received an x-ray today, you will receive an invoice from [facility]. Please contact [facility]  at [phone] with questions or concerns regarding your invoice.   IF you received labwork today, you will receive an invoice from [place]. Please contact [facility] at [phone] with questions or concerns regarding your invoice.   Our billing staff will not be able to assist you with questions regarding bills from these companies.  You will be contacted with the lab results as soon as they are available. The fastest way to get your results is to activate your My Chart account. Instructions are located on the last page of this paperwork. If you have not heard from [place] regarding the results in 2 weeks, please contact this office.

## 2017-09-09 NOTE — ED Notes (Signed)
Pt was advised to stay but he said he was going to cardiologist tomorrow. Said he was feeling fine.

## 2017-09-09 NOTE — ED Triage Notes (Signed)
Pt sent by PCP for CP eval. Pt reports central CP that feels like dull, pressure that started Monday. Pt reports SHOB. denies N/V, dizziness, lightheadedness, weakness. Pt denies cardiac history.

## 2017-09-09 NOTE — Progress Notes (Signed)
Subjective:    Patient ID: Lee Krause, male    DOB: [birth year], 52 y.o.   MRN: [id number]  HPI Lee Krause is a 52 y.o. male Presents today for: Chief Complaint  Patient presents with  . 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  He was hospitalized in March for chest pain.  Cardiac enzymes were negative at that time, outpatient stress testing low risk Myoview March 28.  EF was mildly reduced at 50 but overall appeared normal.  He was seen cardiology in follow-up and had some chest pressure at that visit, thought to have component of indigestion and anxiety with family health and new job stress.  He was started on Protonix and Toprol at that visit.  Initial plan for cardiology follow-up in 1 month from April, but had not yet seen when I talked to him last visit.  He does have a family history of cardiovascular disease with a brother who had an MI at age 60 and several family members with hypertension.  Cardiac risk factors of family history, tobacco abuse, hypertension, hyperlipidemia, age. Has appt with cardiology tomorrow.   Last visit recommended he decrease alcohol use to no more than 1 drink per day, restart HCTZ 12.5 mg daily.  He also been off Lipitor, restarted that at 40 mg daily.  Information was provided on stress management, and continued on omeprazole.  He was referred back to previous gastroenterologist for heartburn. appt with GI October 3rd. Dr. Henrene Pastor.   Today - reports chest pain.  Went to [employer] last week.  Felt fine. Went to gym, exercising on vacation, but more exertion at work.  No chest symptoms with activity. Some pain in center chest returned when return ed back to work 3 days ago. Walking at work, pulling orders - some more exertion at work due to pain had to stop and catch breath 3 days ago due to pressure - same. No associated n/v/diaphoresis. Pain  in upper chest only, no radiation. Denies heartburn. Taking PPI. No new calf pain/swelling, no hx of dvt.  No cough, but concerned that may be due to dust at work. No asthma or COPD hx. Slight discomfort in center of chest currently. Pain 5-6/10 currently - slight short of breath. Dull pressure, slight burn. At worst, 7-8/10. Has to kneel down - noticed yesterday. Pain worse with exertion. Has neck pinched nerve, but neck mvmt does not change chest symptoms.   Tx; none.   SH: alcohol 1 beer per night. Tobacco - cut back 6 cigarettes in last week.       Patient Active Problem List   Diagnosis Date Noted  . Atypical chest pain 04/05/2017  . Essential hypertension 04/05/2017  . Hematochezia   . Lower GI bleed 05/22/2016  . Family history of ischemic heart disease 11/18/2012  . Tobacco use disorder 11/18/2012   Past Medical History:  Diagnosis Date  . Chest pain   . Gastritis   . GERD (gastroesophageal reflux disease)   . Tobacco abuse    Past Surgical History:  Procedure Laterality Date  . TOOTH EXTRACTION     No Known Allergies Prior to Admission medications   Medication Sig Start Date End Date Taking? Authorizing Provider  aspirin EC 81 MG tablet Take 81 mg by mouth daily.   Yes [provider]  atorvastatin (LIPITOR)  40 MG tablet Take 1 tablet (40 mg total) by mouth daily at 6 PM. 08/05/17  Yes Wendie Agreste, MD  hydrochlorothiazide (MICROZIDE) 12.5 MG capsule Take 1 capsule (12.5 mg total) by mouth daily. 08/05/17  Yes Wendie Agreste, MD  Multiple Vitamin (MULTIVITAMIN WITH MINERALS) TABS tablet Take 1 tablet by mouth daily.   Yes [provider]  pantoprazole (PROTONIX) 40 MG tablet Take 1 tablet (40 mg total) by mouth daily. 04/14/17  Yes Imogene Burn, PA-C   Social History   Socioeconomic History  . Marital status: Married    Spouse name: Not on file  . Number of children: Not on file  . Years of education: Not on file  . Highest education  level: Not on file  Occupational History  . Not on file  Social Needs  . Financial resource strain: Not on file  . Food insecurity:    Worry: Not on file    Inability: Not on file  . Transportation needs:    Medical: Not on file    Non-medical: Not on file  Tobacco Use  . Smoking status: Current Every Day Smoker    Packs/day: 0.25    Types: Cigarettes  . Smokeless tobacco: Never Used  Substance and Sexual Activity  . Alcohol use: Yes    Comment: Most days; after work  . Drug use: No  . Sexual activity: Yes  Lifestyle  . Physical activity:    Days per week: Not on file    Minutes per session: Not on file  . Stress: Not on file  Relationships  . Social connections:    Talks on phone: Not on file    Gets together: Not on file    Attends religious service: Not on file    Active member of club or organization: Not on file    Attends meetings of clubs or organizations: Not on file    Relationship status: Not on file  . Intimate partner violence:    Fear of current or ex partner: Not on file    Emotionally abused: Not on file    Physically abused: Not on file    Forced sexual activity: Not on file  Other Topics Concern  . Not on file  Social History Narrative  . Not on file    Review of Systems  Constitutional: Negative for diaphoresis and fever.  Respiratory: Positive for chest tightness and shortness of breath.   Cardiovascular: Positive for chest pain.  Musculoskeletal: Negative for joint swelling.       Objective:   Physical Exam  Constitutional: He is oriented to person, place, and time. He appears well-developed and well-nourished.  HENT:  Head: Normocephalic and atraumatic.  Eyes: Pupils are equal, round, and reactive to light. EOM are normal.  Neck: No JVD present. Carotid bruit is not present.  Cardiovascular: Normal rate, regular rhythm and normal heart sounds.  No murmur heard. Pulmonary/Chest: Effort normal and breath sounds normal. He has no rales.    Musculoskeletal: He exhibits no edema.  Neurological: He is alert and oriented to person, place, and time.  Skin: Skin is warm and dry.  Psychiatric: He has a normal mood and affect.  Vitals reviewed.  Vitals:   09/09/17 1421 09/09/17 1438  BP: (!) 132/92 134/90  Pulse: 77   Temp: 98.9 F (37.2 C)   TempSrc: Oral   SpO2: 96%   Weight: 227 lb (103 kg)   Height: 6\' 4"  (1.93 m)  EKG: SR, rate 75, nonspecific ST in V2 V3, but unchanged from April 06, 2017 T wave inversion in lead III, again similar to March 2019.   .3:03 PM GI cocktail given. 3:38 PM Discomfort improved - only 1-2/10.  Still feels short of breath. Thinks rest helped symptoms, not necessarily GI cocktail.   Dg Chest 2 View  Result Date: 09/09/2017 CLINICAL DATA:  Chest pain EXAM: CHEST - 2 VIEW COMPARISON:  04/05/2017 FINDINGS: The heart size and mediastinal contours are within normal limits. Both lungs are clear. The visualized skeletal structures are unremarkable. IMPRESSION: No active cardiopulmonary disease. Electronically Signed   By: Inez Catalina M.D.   On: 09/09/2017 15:25      Assessment & Plan:    Lee Krause is a 52 y.o. male Chest pain, unspecified type - Plan: EKG 12-Lead, gi cocktail (Maalox,Lidocaine,Donnatal), DG Chest 2 View  Substernal chest pain/pressure with exertion past 4 days.  History of cardiac disease prior chest pain evaluated in the hospital, overall reassuring as well as outpatient testing.   Current pain is associated with exertion, and associated with dyspnea.  Symptoms present in the office with rest at a level of 5-6 out of 10.    - GI cocktail was given with some improvement in symptoms, but he attributed this to resting and not use of GI cocktail. Still complains of 1-2/10 chest pressure with dyspnea.   - Recommended EMS transport for further evaluation in the ER.  EMS transport was refused, he plans to drive himself by private vehicle.  Potential risks of progression of  symptoms discussed and 911 precautions in route.   Meds ordered this encounter  Medications  . gi cocktail (Maalox,Lidocaine,Donnatal)   Patient Instructions     Although some improvement with GI cocktail, I recommend further evaluation in the emergency room as symptoms have persisted.  If you have any change in symptoms on the way to the ER, pull over and call 911.    Nonspecific Chest Pain Chest pain can be caused by many different conditions. There is always a chance that your pain could be related to something serious, such as a heart attack or a blood clot in your lungs. Chest pain can also be caused by conditions that are not life-threatening. If you have chest pain, it is very important to follow up with your health care provider. What are the causes? Causes of this condition include:  Heartburn.  Pneumonia or bronchitis.  Anxiety or stress.  Inflammation around your heart (pericarditis) or lung (pleuritis or pleurisy).  A blood clot in your lung.  A collapsed lung (pneumothorax). This can develop suddenly on its own (spontaneous pneumothorax) or from trauma to the chest.  Shingles infection (varicella-zoster virus).  Heart attack.  Damage to the bones, muscles, and cartilage that make up your chest wall. This can include: ? Bruised bones due to injury. ? Strained muscles or cartilage due to frequent or repeated coughing or overwork. ? Fracture to one or more ribs. ? Sore cartilage due to inflammation (costochondritis).  What increases the risk? Risk factors for this condition may include:  Activities that increase your risk for trauma or injury to your chest.  Respiratory infections or conditions that cause frequent coughing.  Medical conditions or overeating that can cause heartburn.  Heart disease or family history of heart disease.  Conditions or health behaviors that increase your risk of developing a blood clot.  Having had chicken pox (varicella  zoster).  What are the  signs or symptoms? Chest pain can feel like:  Burning or tingling on the surface of your chest or deep in your chest.  Crushing, pressure, aching, or squeezing pain.  Dull or sharp pain that is worse when you move, cough, or take a deep breath.  Pain that is also felt in your back, neck, shoulder, or arm, or pain that spreads to any of these areas.  Your chest pain may come and go, or it may stay constant. How is this diagnosed? Lab tests or other studies may be needed to find the cause of your pain. Your health care provider may have you take a test called an ECG (electrocardiogram). An ECG records your heartbeat patterns at the time the test is performed. You may also have other tests, such as:  Transthoracic echocardiogram (TTE). In this test, sound waves are used to create a picture of the heart structures and to look at how blood flows through your heart.  Transesophageal echocardiogram (TEE).This is a more advanced imaging test that takes images from inside your body. It allows your health care provider to see your heart in finer detail.  Cardiac monitoring. This allows your health care provider to monitor your heart rate and rhythm in real time.  Holter monitor. This is a portable device that records your heartbeat and can help to diagnose abnormal heartbeats. It allows your health care provider to track your heart activity for several days, if needed.  Stress tests. These can be done through exercise or by taking medicine that makes your heart beat more quickly.  Blood tests.  Other imaging tests.  How is this treated? Treatment depends on what is causing your chest pain. Treatment may include:  Medicines. These may include: ? Acid blockers for heartburn. ? Anti-inflammatory medicine. ? Pain medicine for inflammatory conditions. ? Antibiotic medicine, if an infection is present. ? Medicines to dissolve blood clots. ? Medicines to treat coronary  artery disease (CAD).  Supportive care for conditions that do not require medicines. This may include: ? Resting. ? Applying heat or cold packs to injured areas. ? Limiting activities until pain decreases.  Follow these instructions at home: Medicines  If you were prescribed an antibiotic, take it as told by your health care provider. Do not stop taking the antibiotic even if you start to feel better.  Take over-the-counter and prescription medicines only as told by your health care provider. Lifestyle  Do not use any products that contain nicotine or tobacco, such as cigarettes and e-cigarettes. If you need help quitting, ask your health care provider.  Do not drink alcohol.  Make lifestyle changes as directed by your health care provider. These may include: ? Getting regular exercise. Ask your health care provider to suggest some activities that are safe for you. ? Eating a heart-healthy diet. A registered dietitian can help you to learn healthy eating options. ? Maintaining a healthy weight. ? Managing diabetes, if necessary. ? Reducing stress, such as with yoga or relaxation techniques. General instructions  Avoid any activities that bring on chest pain.  If heartburn is the cause for your chest pain, raise (elevate) the head of your bed about 6 inches (15 cm) by putting blocks under the legs. Sleeping with more pillows does not effectively relieve heartburn because it only changes the position of your head.  Keep all follow-up visits as told by your health care provider. This is important. This includes any further testing if your chest pain does not go away.  Contact a health care provider if:  Your chest pain does not go away.  You have a rash with blisters on your chest.  You have a fever.  You have chills. Get help right away if:  Your chest pain is worse.  You have a cough that gets worse, or you cough up blood.  You have severe pain in your abdomen.  You  have severe weakness.  You faint.  You have sudden, unexplained chest discomfort.  You have sudden, unexplained discomfort in your arms, back, neck, or jaw.  You have shortness of breath at any time.  You suddenly start to sweat, or your skin gets clammy.  You feel nauseous or you vomit.  You suddenly feel light-headed or dizzy.  Your heart begins to beat quickly, or it feels like it is skipping beats. These symptoms may represent a serious problem that is an emergency. Do not wait to see if the symptoms will go away. Get medical help right away. Call your local emergency services (911 in the [place] Do not drive yourself to the hospital. This information is not intended to replace advice given to you by your health care provider. Make sure you discuss any questions you have with your health care provider. Document Released: 10/08/2004 Document Revised: 09/23/2015 Document Reviewed: 09/23/2015 Elsevier Interactive Patient Education  [employer].   If you have lab work done today you will be contacted with your lab results within the next 2 weeks.  If you have not heard from [place] then please contact us. The fastest way to get your results is to register for My Chart.   IF you received an x-ray today, you will receive an invoice from [facility]. Please contact [facility] at [phone] with questions or concerns regarding your invoice.   IF you received labwork today, you will receive an invoice from [facility]. Please contact [facility] at [phone] with questions or concerns regarding your invoice.   Our billing staff will not be able to assist you with questions regarding bills from these companies.  You will be contacted with the lab results as soon as they are available. The fastest way to get your results is to activate your My Chart account. Instructions are located on the last page of this paperwork. If you have not heard from [place] regarding the results in  2 weeks, please contact this office.       Signed,   Merri Ray, MD [facility].  09/09/17 3:46 PM

## 2017-09-10 ENCOUNTER — Ambulatory Visit: Payer: Managed Care, Other (non HMO) | Admitting: Interventional Cardiology

## 2017-09-10 ENCOUNTER — Encounter: Payer: Self-pay | Admitting: Interventional Cardiology

## 2017-09-10 VITALS — BP 126/90 | HR 90 | Ht 76.0 in | Wt 227.0 lb

## 2017-09-10 DIAGNOSIS — I10 Essential (primary) hypertension: Secondary | ICD-10-CM | POA: Diagnosis not present

## 2017-09-10 DIAGNOSIS — R072 Precordial pain: Secondary | ICD-10-CM

## 2017-09-10 DIAGNOSIS — F17200 Nicotine dependence, unspecified, uncomplicated: Secondary | ICD-10-CM

## 2017-09-10 DIAGNOSIS — Z8249 Family history of ischemic heart disease and other diseases of the circulatory system: Secondary | ICD-10-CM

## 2017-09-10 MED ORDER — ISOSORBIDE MONONITRATE ER 30 MG PO TB24: 30.0000 mg | ORAL_TABLET | Freq: Every day | ORAL | 3 refills | Status: DC

## 2017-09-10 NOTE — Patient Instructions (Addendum)
Medication Instructions:  Your physician has recommended you make the following change in your medication:  1. START: isosorbide mononitrate (imdur) 30 mg tablet: Take 1 tablet by mouth once a day  Labwork: None ordered  Testing/Procedures: None ordered  Follow-Up: Your physician recommends that you follow-up with Ermalinda Barrios, PA on 09/28/17 at 11:30 AM.  CALL us BACK IF YOUR SYMPTOMS CHANGE OR WORSEN    Any Other Special Instructions Will Be Listed Below (If Applicable).   Steps to Quit Smoking Smoking tobacco can be bad for your health. It can also affect almost every organ in your body. Smoking puts you and people around you at risk for many serious long-lasting (chronic) diseases. Quitting smoking is hard, but it is one of the best things that you can do for your health. It is never too late to quit. What are the benefits of quitting smoking? When you quit smoking, you lower your risk for getting serious diseases and conditions. They can include:  Lung cancer or lung disease.  Heart disease.  Stroke.  Heart attack.  Not being able to have children (infertility).  Weak bones (osteoporosis) and broken bones (fractures).  If you have coughing, wheezing, and shortness of breath, those symptoms may get better when you quit. You may also get sick less often. If you are pregnant, quitting smoking can help to lower your chances of having a baby of low birth weight. What can I do to help me quit smoking? Talk with your doctor about what can help you quit smoking. Some things you can do (strategies) include:  Quitting smoking totally, instead of slowly cutting back how much you smoke over a period of time.  Going to in-person counseling. You are more likely to quit if you go to many counseling sessions.  Using resources and support systems, such as: ? Database administrator with a Social worker. ? Phone quitlines. ? Careers information officer. ? Support groups or group  counseling. ? Text messaging programs. ? Mobile phone apps or applications.  Taking medicines. Some of these medicines may have nicotine in them. If you are pregnant or breastfeeding, do not take any medicines to quit smoking unless your doctor says it is okay. Talk with your doctor about counseling or other things that can help you.  Talk with your doctor about using more than one strategy at the same time, such as taking medicines while you are also going to in-person counseling. This can help make quitting easier. What things can I do to make it easier to quit? Quitting smoking might feel very hard at first, but there is a lot that you can do to make it easier. Take these steps:  Talk to your family and friends. Ask them to support and encourage you.  Call phone quitlines, reach out to support groups, or work with a Social worker.  Ask people who smoke to not smoke around you.  Avoid places that make you want (trigger) to smoke, such as: ? Bars. ? Parties. ? Smoke-break areas at work.  Spend time with people who do not smoke.  Lower the stress in your life. Stress can make you want to smoke. Try these things to help your stress: ? Getting regular exercise. ? Deep-breathing exercises. ? Yoga. ? Meditating. ? Doing a body scan. To do this, close your eyes, focus on one area of your body at a time from head to toe, and notice which parts of your body are tense. Try to relax the muscles in those  areas.  Download or buy apps on your mobile phone or tablet that can help you stick to your quit plan. There are many free apps, such as QuitGuide from the [employer] Office manager for Disease Control and Prevention). You can find more support from smokefree.gov and other websites.  This information is not intended to replace advice given to you by your health care provider. Make sure you discuss any questions you have with your health care provider. Document Released: 10/25/2008 Document Revised: 08/27/2015  Document Reviewed: 05/15/2014 Elsevier Interactive Patient Education  [employer].    If you need a refill on your cardiac medications before your next appointment, please call your pharmacy.

## 2017-09-10 NOTE — Progress Notes (Signed)
Cardiology Office Note   Date:  09/10/2017   ID:  Lee Krause, DOB September 26, 1965, MRN [id number]  PCP:  Wendie Agreste, MD    No chief complaint on file.  Chest pain  Wt Readings from Last 3 Encounters:  09/10/17 227 lb (103 kg)  09/09/17 227 lb (103 kg)  08/05/17 228 lb 9.6 oz (103.7 kg)       History of Present Illness: Lee Krause is a 52 y.o. male  With atypical chest pain, family history of early CAD with a brother dying of an MI at age 60.  Baseline GXT donein 2014 and he was advised to quit smoking.  Patient also has hypertension and HLD.  Patient was discharged from the hospital 04/06/17 with atypical chest pain.  Troponins negative, outpatient 2D echo normal LVEF, nuclear stress test LVEF low normal 50% hypertensive response to exercise, low risk study.  He had persistent chest pain after that time and anxiety. Protonix was added in 4/19.  He was working on stopping smoking as well.   Over the last few days, he has had more chest pain.  He went to the ER after seeing his PMD.  THe chest pain comes and goes.  ER w/u was negative. He left before being admitted.    He is following up with GI.  He has an EGD/colonoscopy in October.   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  He can work for hours and feel well.  He feels worse at the end of the day.  He has some pressure after eating.   Past Medical History:  Diagnosis Date  . Chest pain   . Gastritis   . GERD (gastroesophageal reflux disease)   . Tobacco abuse     Past Surgical History:  Procedure Laterality Date  . TOOTH EXTRACTION       Current Outpatient Medications  Medication Sig Dispense Refill  . aspirin EC 81 MG tablet Take 81 mg by mouth daily.    Marland Kitchen atorvastatin (LIPITOR) 40 MG tablet Take 1 tablet (40 mg total) by mouth daily at 6 PM. 90 tablet 1  . hydrochlorothiazide (MICROZIDE) 12.5 MG capsule Take 1 capsule  (12.5 mg total) by mouth daily. 90 capsule 1  . Multiple Vitamin (MULTIVITAMIN WITH MINERALS) TABS tablet Take 1 tablet by mouth daily.    . pantoprazole (PROTONIX) 40 MG tablet Take 1 tablet (40 mg total) by mouth daily. 90 tablet 3   No current facility-administered medications for this visit.     Allergies:   Patient has no known allergies.    Social History:  The patient  reports that he has been smoking cigarettes. He has been smoking about 0.25 packs per day. He has never used smokeless tobacco. He reports that he drinks alcohol. He reports that he does not use drugs.   Family History:  The patient's family history includes Heart attack in his brother; Hypertension in his father.    ROS:  Please see the history of present illness.   Otherwise, review of systems are positive for chest pain.   All other systems are reviewed and negative.    PHYSICAL EXAM: VS:  BP 126/90   Pulse 90   Ht 6\' 4"  (1.93 m)   Wt 227 lb (103 kg)   BMI 27.63 kg/m  , BMI Body mass index is 27.63 kg/m. GEN:  Well nourished, well developed, in no acute distress  HEENT: normal  Neck: no JVD, carotid bruits, or masses Cardiac: RRR; no murmurs, rubs, or gallops,no edema  Respiratory:  clear to auscultation bilaterally, normal work of breathing GI: soft, nontender, nondistended, + BS MS: no deformity or atrophy  Skin: warm and dry, no rash Neuro:  Strength and sensation are intact Psych: euthymic mood, full affect   EKG:   The ekg ordered yesterday demonstrates NSR, nonspecific ST changes   Recent Labs: 08/05/2017: ALT 17; TSH 1.730 09/09/2017: BUN 16; Creatinine, Ser 0.99; Hemoglobin 14.0; Platelets 277; Potassium 3.7; Sodium 139   Lipid Panel    Component Value Date/Time   CHOL 233 (H) 08/05/2017 1508   TRIG 285 (H) 08/05/2017 1508   HDL 57 08/05/2017 1508   CHOLHDL 4.1 08/05/2017 1508   CHOLHDL 4.1 04/06/2017 0539   VLDL 30 04/06/2017 0539   LDLCALC 119 (H) 08/05/2017 1508   LDLDIRECT  130.6 11/25/2012 1540     Other studies Reviewed: Additional studies/ records that were reviewed today with results demonstrating: stress test reviewed and negative.   ASSESSMENT AND PLAN:  1. Chest pain: Some typical and some atypical features.  Given family history, we discussed further evaluation with either CT angiogram or invasive coronary angiogram.  We agreed to given the chronicity of his symptoms, the invasive angiogram would be better.  He will try isosorbide 30 mg daily to see if this helps.  Stress test was negative.  He could have some type of microvascular angina.  If no improvement with Imdur, will plan for cath.   GI eval planned for October. 2. HTN: Outside of MDs office, BP controlled on medicine. He wants to start exercising to get off of the medicine.  3. Tobacco abuse: He needs to stop smoking.  4. Family h/o ischemic heart disease: I recommend that he stop smoking and increase exercise.    Current medicines are reviewed at length with the patient today.  The patient concerns regarding his medicines were addressed.  The following changes have been made:  No change  Labs/ tests ordered today include:  No orders of the defined types were placed in this encounter.   Recommend 150 minutes/week of aerobic exercise Low fat, low carb, high fiber diet recommended  Disposition:   FU in 3 weeks; he will call to let us know how his sx are doing   Signed, Larae Grooms, MD  09/10/2017 2:01 PM    [facility] [place], [place], [place]  [address] Phone: [phone]; Fax: [phone]

## 2017-09-10 NOTE — H&P (View-Only) (Signed)
Cardiology Office Note   Date:  09/10/2017   ID:  Lee Krause, DOB 1965/08/14, MRN [id number]  PCP:  Wendie Agreste, MD    No chief complaint on file.  Chest pain  Wt Readings from Last 3 Encounters:  09/10/17 227 lb (103 kg)  09/09/17 227 lb (103 kg)  08/05/17 228 lb 9.6 oz (103.7 kg)       History of Present Illness: Lee Krause is a 52 y.o. male  With atypical chest pain, family history of early CAD with a brother dying of an MI at age 34.  Baseline GXT donein 2014 and he was advised to quit smoking.  Patient also has hypertension and HLD.  Patient was discharged from the hospital 04/06/17 with atypical chest pain.  Troponins negative, outpatient 2D echo normal LVEF, nuclear stress test LVEF low normal 50% hypertensive response to exercise, low risk study.  He had persistent chest pain after that time and anxiety. Protonix was added in 4/19.  He was working on stopping smoking as well.   Over the last few days, he has had more chest pain.  He went to the ER after seeing his PMD.  THe chest pain comes and goes.  ER w/u was negative. He left before being admitted.    He is following up with GI.  He has an EGD/colonoscopy in October.   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  He can work for hours and feel well.  He feels worse at the end of the day.  He has some pressure after eating.   Past Medical History:  Diagnosis Date  . Chest pain   . Gastritis   . GERD (gastroesophageal reflux disease)   . Tobacco abuse     Past Surgical History:  Procedure Laterality Date  . TOOTH EXTRACTION       Current Outpatient Medications  Medication Sig Dispense Refill  . aspirin EC 81 MG tablet Take 81 mg by mouth daily.    Marland Kitchen atorvastatin (LIPITOR) 40 MG tablet Take 1 tablet (40 mg total) by mouth daily at 6 PM. 90 tablet 1  . hydrochlorothiazide (MICROZIDE) 12.5 MG capsule Take 1 capsule  (12.5 mg total) by mouth daily. 90 capsule 1  . Multiple Vitamin (MULTIVITAMIN WITH MINERALS) TABS tablet Take 1 tablet by mouth daily.    . pantoprazole (PROTONIX) 40 MG tablet Take 1 tablet (40 mg total) by mouth daily. 90 tablet 3   No current facility-administered medications for this visit.     Allergies:   Patient has no known allergies.    Social History:  The patient  reports that he has been smoking cigarettes. He has been smoking about 0.25 packs per day. He has never used smokeless tobacco. He reports that he drinks alcohol. He reports that he does not use drugs.   Family History:  The patient's family history includes Heart attack in his brother; Hypertension in his father.    ROS:  Please see the history of present illness.   Otherwise, review of systems are positive for chest pain.   All other systems are reviewed and negative.    PHYSICAL EXAM: VS:  BP 126/90   Pulse 90   Ht 6\' 4"  (1.93 m)   Wt 227 lb (103 kg)   BMI 27.63 kg/m  , BMI Body mass index is 27.63 kg/m. GEN:  Well nourished, well developed, in no acute distress  HEENT: normal  Neck: no JVD, carotid bruits, or masses Cardiac: RRR; no murmurs, rubs, or gallops,no edema  Respiratory:  clear to auscultation bilaterally, normal work of breathing GI: soft, nontender, nondistended, + BS MS: no deformity or atrophy  Skin: warm and dry, no rash Neuro:  Strength and sensation are intact Psych: euthymic mood, full affect   EKG:   The ekg ordered yesterday demonstrates NSR, nonspecific ST changes   Recent Labs: 08/05/2017: ALT 17; TSH 1.730 09/09/2017: BUN 16; Creatinine, Ser 0.99; Hemoglobin 14.0; Platelets 277; Potassium 3.7; Sodium 139   Lipid Panel    Component Value Date/Time   CHOL 233 (H) 08/05/2017 1508   TRIG 285 (H) 08/05/2017 1508   HDL 57 08/05/2017 1508   CHOLHDL 4.1 08/05/2017 1508   CHOLHDL 4.1 04/06/2017 0539   VLDL 30 04/06/2017 0539   LDLCALC 119 (H) 08/05/2017 1508   LDLDIRECT  130.6 11/25/2012 1540     Other studies Reviewed: Additional studies/ records that were reviewed today with results demonstrating: stress test reviewed and negative.   ASSESSMENT AND PLAN:  1. Chest pain: Some typical and some atypical features.  Given family history, we discussed further evaluation with either CT angiogram or invasive coronary angiogram.  We agreed to given the chronicity of his symptoms, the invasive angiogram would be better.  He will try isosorbide 30 mg daily to see if this helps.  Stress test was negative.  He could have some type of microvascular angina.  If no improvement with Imdur, will plan for cath.   GI eval planned for October. 2. HTN: Outside of MDs office, BP controlled on medicine. He wants to start exercising to get off of the medicine.  3. Tobacco abuse: He needs to stop smoking.  4. Family h/o ischemic heart disease: I recommend that he stop smoking and increase exercise.    Current medicines are reviewed at length with the patient today.  The patient concerns regarding his medicines were addressed.  The following changes have been made:  No change  Labs/ tests ordered today include:  No orders of the defined types were placed in this encounter.   Recommend 150 minutes/week of aerobic exercise Low fat, low carb, high fiber diet recommended  Disposition:   FU in 3 weeks; he will call to let us know how his sx are doing   Signed, Larae Grooms, MD  09/10/2017 2:01 PM    [facility] [place], [place], [place]  [address] Phone: [phone]; Fax: [phone]

## 2017-09-14 ENCOUNTER — Ambulatory Visit: Payer: Self-pay | Admitting: *Deleted

## 2017-09-14 NOTE — Telephone Encounter (Signed)
Pt reports chest pain "As before." Was seen by Dr. Carlota Raspberry 09/09/17 ,sent to ED. Saw cardiologist 09/10/17. Pt states same symptoms as before. Pressure type discomfort radiates to neck and left shoulder at times. Duration of episodes 10-15 minutes.  States he has been "Reading up on angina" ...and thinks this is what he is experiencing. Reports pain is mid-sternal,intermittent , 5/10 when occurs. SOB with episodes.  States he "Just wants Dr. Carlota Raspberry to be aware." Instructed pt to alert Dr. Irish Lack of ongoing symptoms; assured pt I would alert Dr. Carlota Raspberry. States he will call cardiologist now. Instructed to go to ED if symptoms worsen.   Reason for Disposition . [1] Chest pain lasts > 5 minutes AND [2] occurred > 3 days ago (72 hours) AND [3] NO chest pain or cardiac symptoms now  Answer Assessment - Initial Assessment Questions 1. LOCATION: "Where does it hurt?"       Mid sternal 2. RADIATION: "Does the pain go anywhere else?" (e.g., into neck, jaw, arms, back)     Neck and left shoulder at times 3. ONSET: "When did the chest pain begin?" (Minutes, hours or days)      1 week 4. PATTERN "Does the pain come and go, or has it been constant since it started?"  "Does it get worse with exertion?"      Comes and goes 5. DURATION: "How long does it last" (e.g., seconds, minutes, hours)     10-15 minutes. Pressure always there 6. SEVERITY: "How bad is the pain?"  (e.g., Scale 1-10; mild, moderate, or severe)    - MILD (1-3): doesn't interfere with normal activities     - MODERATE (4-7): interferes with normal activities or awakens from sleep    - SEVERE (8-10): excruciating pain, unable to do any normal activities       5-6/10 at times 7. CARDIAC RISK FACTORS: "Do you have any history of heart problems or risk factors for heart disease?" (e.g., prior heart attack, angina; high blood pressure, diabetes, being overweight, high cholesterol, smoking, or strong family history of heart disease)     yes 8.  PULMONARY RISK FACTORS: "Do you have any history of lung disease?"  (e.g., blood clots in lung, asthma, emphysema, birth control pills)      9. CAUSE: "What do you think is causing the chest pain?"     Angina 10. OTHER SYMPTOMS: "Do you have any other symptoms?" (e.g., dizziness, nausea, vomiting, sweating, fever, difficulty breathing, cough)       SOB  Protocols used: CHEST PAIN-A-AH

## 2017-09-16 ENCOUNTER — Telehealth: Payer: Self-pay | Admitting: Interventional Cardiology

## 2017-09-16 NOTE — Telephone Encounter (Signed)
Message re: L/shoulder, neck pressure - Pt seen in ED 8/29, saw Cardiologist 8/30 FYI for Dr. Carlota Raspberry

## 2017-09-16 NOTE — Telephone Encounter (Signed)
New message   Pt c/o medication issue:  1. Name of Medication: isosorbide mononitrate (IMDUR) 30 MG 24 hr tablet  2. How are you currently taking this medication (dosage and times per day)? Once a day at night  3. Are you having a reaction (difficulty breathing--STAT)? No   4. What is your medication issue?Patient wants to know if he can stop this medication because the patient states that he is still having the same symptoms that he was having at his office visit on 09/10/2017.  Please call to discuss.

## 2017-09-16 NOTE — Telephone Encounter (Signed)
Returned call to patient. Patient states that he continues to have intermittent episodes of SOB and chest pain even since starting the imdur 30 mg QD. Patient states that these episodes are not associated with any type of activity and that they can happen at rest and on exertion. He states that they last for 15-30 minutes and then go away. He states that he takes deep breaths to help the episodes pass. Patient states that his BP has been fine. He denies any lightheadedness, dizziness, N/V, or any other Sx. Patient is a very anxious individual. Will discuss with Dr. Irish Lack to see if he would like to proceed with cath as discussed at [facility] on 8/30.

## 2017-09-16 NOTE — Telephone Encounter (Signed)
Patient states that he would like to proceed with cath and did not want to wait until his appointment with Ermalinda Barrios, PA. Patient wants to do his heart cath next Friday. Patient has already had normal labs done on 8/29. Patient has been scheduled for 9/13 with Dr. Tamala Julian. Reviewed instructions below with the patient. Patient verbalized understanding.     [facility] [place], [place] [address] Dept: [phone] Loc: [facility]  09/16/2017  You are scheduled for a Cardiac Catheterization on Friday, September 13 with Dr. Daneen Schick.  1. Please arrive at the [facility] (Main Entrance A) at [facility]: [address] [place], [place] [address] at 5:30 AM (This time is two hours before your procedure to ensure your preparation). Free valet parking service is available.   Special note: Every effort is made to have your procedure done on time. Please understand that emergencies sometimes delay scheduled procedures.  2. Diet: Do not eat solid foods after midnight.    3. Labs: Labs done 8/29  4. Medication instructions in preparation for your procedure:   Contrast Allergy: No  DO NOT take your hydrochlorothiazide the morning of your procedure  On the morning of your procedure, take your Aspirin and any morning medicines NOT listed above.  You may use sips of water.  5. Plan for one night stay--bring personal belongings. 6. Bring a current list of your medications and current insurance cards. 7. You MUST have a responsible person to drive you home. 8. Someone MUST be with you the first 24 hours after you arrive home or your discharge will be delayed. 9. Please wear clothes that are easy to get on and off and wear slip-on shoes.  Thank you for allowing [place] to care for you!   -- [facility]

## 2017-09-17 ENCOUNTER — Telehealth: Payer: Self-pay | Admitting: Family Medicine

## 2017-09-17 NOTE — Telephone Encounter (Signed)
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  He said if he has to have restrictions that is fine.  Please call when ready to pick up.  I have left this in your box at the nurse desk.

## 2017-09-20 NOTE — Telephone Encounter (Signed)
Copied from [place] [id number]. Topic: Inquiry >> Sep 20, 2017  4:01 PM Vernona Rieger wrote: Reason for CRM: patient said he dropped off a fitness form last week and was advised that Dr Nyoka Cowden was out of the office until tomorrow. He would like some information added to it.  It needs to be light duty and no more than 8 hours is what he wants added to his fitness form.

## 2017-09-21 NOTE — Telephone Encounter (Signed)
I have received the paperwork and placed it in the provider folder to be completed.   Thanks, [employer]

## 2017-09-22 NOTE — Telephone Encounter (Signed)
I spoke to the patient regarding the risks and benefits of cardiac cath.  All questions were answered and he is willing to proceed.  Jettie Booze, MD

## 2017-09-22 NOTE — Telephone Encounter (Signed)
Pt calling to find out when he will be able to pick this paperwork up

## 2017-09-22 NOTE — Telephone Encounter (Signed)
More information needed. I am being asked to complete fitness for duty form, but I saw him on September 09, 2017 and sent him to the ER for exertional chest pain.  I see where he was seen by cardiology subsequently with some typical and atypical chest pain features, but planned cardiac catheterization in 2 days.  As far as particular restrictions would recommend those be determined by cardiology.  We can complete a temporary work note until cardiology is able to determine fitness for duty but would avoid any exertion until cleared to do so by cardiology.

## 2017-09-22 NOTE — Telephone Encounter (Signed)
Pt calling to check status of paperwork dropped of 09/17/17. Pt needs to turn this in to his employer. Please call pt at [phone].

## 2017-09-22 NOTE — Telephone Encounter (Signed)
Patient called to leave Fax# [phone]  Att. Ok Edwards in [place].    For paperwork to be faxed in please.

## 2017-09-23 ENCOUNTER — Telehealth: Payer: Self-pay | Admitting: *Deleted

## 2017-09-23 NOTE — Telephone Encounter (Signed)
I have called the pt back this morning and informed him that a letter can be written to cover him from the last couple of days until he gets his procedure done on 09/24/17. Pt has already come by and picked up the letter and for future letters and work information about cardio should be written by his cardiologist that he is following.  Thanks, [employer]

## 2017-09-23 NOTE — Telephone Encounter (Signed)
Pt contacted pre-catheterization scheduled at [facility] for: Friday September 24, 2017 7:30 AM Verified arrival time and place: [place] Entrance A at: 5:30 AM  No solid food after midnight prior to cath, clear liquids until 5 AM day of procedure. Verified no known  Allergies.  Hold: HCTZ-AM of procedure  Except hold medications AM meds can be  taken pre-cath with sip of water including: ASA 81 mg  Confirmed patient has responsible person to drive home post procedure and for 24 hours after you arrive home: yes

## 2017-09-24 ENCOUNTER — Encounter (HOSPITAL_COMMUNITY): Payer: Self-pay | Admitting: Interventional Cardiology

## 2017-09-24 ENCOUNTER — Ambulatory Visit (HOSPITAL_COMMUNITY)
Admission: RE | Admit: 2017-09-24 | Discharge: 2017-09-24 | Disposition: A | Discharge status: Home / Self Care | Payer: Managed Care, Other (non HMO) | Source: Ambulatory Visit | Attending: Interventional Cardiology | Admitting: Interventional Cardiology

## 2017-09-24 ENCOUNTER — Ambulatory Visit (HOSPITAL_COMMUNITY)
Admission: RE | Disposition: A | Discharge status: Home / Self Care | Payer: Self-pay | Source: Ambulatory Visit | Attending: Interventional Cardiology

## 2017-09-24 ENCOUNTER — Other Ambulatory Visit: Payer: Self-pay

## 2017-09-24 DIAGNOSIS — Z8249 Family history of ischemic heart disease and other diseases of the circulatory system: Secondary | ICD-10-CM | POA: Diagnosis not present

## 2017-09-24 DIAGNOSIS — R0789 Other chest pain: Secondary | ICD-10-CM | POA: Diagnosis present

## 2017-09-24 DIAGNOSIS — Z7982 Long term (current) use of aspirin: Secondary | ICD-10-CM | POA: Diagnosis not present

## 2017-09-24 DIAGNOSIS — K219 Gastro-esophageal reflux disease without esophagitis: Secondary | ICD-10-CM | POA: Diagnosis not present

## 2017-09-24 DIAGNOSIS — I2510 Atherosclerotic heart disease of native coronary artery without angina pectoris: Secondary | ICD-10-CM | POA: Diagnosis not present

## 2017-09-24 DIAGNOSIS — E785 Hyperlipidemia, unspecified: Secondary | ICD-10-CM | POA: Diagnosis not present

## 2017-09-24 DIAGNOSIS — F17210 Nicotine dependence, cigarettes, uncomplicated: Secondary | ICD-10-CM | POA: Insufficient documentation

## 2017-09-24 DIAGNOSIS — F17200 Nicotine dependence, unspecified, uncomplicated: Secondary | ICD-10-CM | POA: Diagnosis present

## 2017-09-24 DIAGNOSIS — I10 Essential (primary) hypertension: Secondary | ICD-10-CM | POA: Diagnosis not present

## 2017-09-24 DIAGNOSIS — F419 Anxiety disorder, unspecified: Secondary | ICD-10-CM | POA: Insufficient documentation

## 2017-09-24 HISTORY — PX: INTRAVASCULAR PRESSURE WIRE/FFR STUDY: CATH118243

## 2017-09-24 HISTORY — PX: LEFT HEART CATH AND CORONARY ANGIOGRAPHY: CATH118249

## 2017-09-24 LAB — POCT ACTIVATED CLOTTING TIME
ACTIVATED CLOTTING TIME: 279 s
ACTIVATED CLOTTING TIME: 235 s
Activated Clotting Time: 235 seconds

## 2017-09-24 SURGERY — LEFT HEART CATH AND CORONARY ANGIOGRAPHY
Anesthesia: LOCAL

## 2017-09-24 MED ORDER — ACETAMINOPHEN 325 MG PO TABS: 650.0000 mg | ORAL_TABLET | ORAL | Status: DC | PRN

## 2017-09-24 MED ORDER — ONDANSETRON HCL 4 MG/2ML IJ SOLN: 4.0000 mg | Freq: Four times a day (QID) | INTRAMUSCULAR | Status: DC | PRN

## 2017-09-24 MED ORDER — HEPARIN SODIUM (PORCINE) 1000 UNIT/ML IJ SOLN
INTRAMUSCULAR | Status: DC | PRN
  Administered 2017-09-24: 2500 [IU] via INTRAVENOUS
  Administered 2017-09-24 (×2): 5000 [IU] via INTRAVENOUS

## 2017-09-24 MED ORDER — SODIUM CHLORIDE 0.9% FLUSH: 3.0000 mL | INTRAVENOUS | Status: DC | PRN

## 2017-09-24 MED ORDER — MIDAZOLAM HCL 2 MG/2ML IJ SOLN
INTRAMUSCULAR | Status: AC
  Filled 2017-09-24: qty 2

## 2017-09-24 MED ORDER — ADENOSINE 12 MG/4ML IV SOLN
INTRAVENOUS | Status: AC
  Filled 2017-09-24: qty 4

## 2017-09-24 MED ORDER — SODIUM CHLORIDE 0.9 % IV SOLN: INTRAVENOUS | Status: DC

## 2017-09-24 MED ORDER — SODIUM CHLORIDE 0.9 % WEIGHT BASED INFUSION: 1.0000 mL/kg/h | INTRAVENOUS | Status: DC

## 2017-09-24 MED ORDER — SODIUM CHLORIDE 0.9 % IV SOLN: 250.0000 mL | INTRAVENOUS | Status: DC | PRN

## 2017-09-24 MED ORDER — FENTANYL CITRATE (PF) 100 MCG/2ML IJ SOLN
INTRAMUSCULAR | Status: DC | PRN
  Administered 2017-09-24: 50 ug via INTRAVENOUS
  Administered 2017-09-24: 25 ug via INTRAVENOUS

## 2017-09-24 MED ORDER — HEPARIN SODIUM (PORCINE) 1000 UNIT/ML IJ SOLN
INTRAMUSCULAR | Status: AC
  Filled 2017-09-24: qty 1

## 2017-09-24 MED ORDER — ADENOSINE (DIAGNOSTIC) 140MCG/KG/MIN
INTRAVENOUS | Status: AC | PRN
  Administered 2017-09-24: 140 ug/kg/min via INTRAVENOUS

## 2017-09-24 MED ORDER — VERAPAMIL HCL 2.5 MG/ML IV SOLN
INTRAVENOUS | Status: AC
  Filled 2017-09-24: qty 2

## 2017-09-24 MED ORDER — HEPARIN (PORCINE) IN NACL 1000-0.9 UT/500ML-% IV SOLN
INTRAVENOUS | Status: DC | PRN
  Administered 2017-09-24: 500 mL

## 2017-09-24 MED ORDER — ADENOSINE 12 MG/4ML IV SOLN
INTRAVENOUS | Status: AC
  Filled 2017-09-24: qty 12

## 2017-09-24 MED ORDER — VERAPAMIL HCL 2.5 MG/ML IV SOLN
INTRAVENOUS | Status: DC | PRN
  Administered 2017-09-24: 10 mL via INTRA_ARTERIAL

## 2017-09-24 MED ORDER — MIDAZOLAM HCL 2 MG/2ML IJ SOLN
INTRAMUSCULAR | Status: DC | PRN
  Administered 2017-09-24: 0.5 mg via INTRAVENOUS
  Administered 2017-09-24: 1 mg via INTRAVENOUS
  Administered 2017-09-24: 0.5 mg via INTRAVENOUS

## 2017-09-24 MED ORDER — CLOPIDOGREL BISULFATE 75 MG PO TABS: 75.0000 mg | ORAL_TABLET | Freq: Every day | ORAL | Status: DC

## 2017-09-24 MED ORDER — IOHEXOL 350 MG/ML SOLN
INTRAVENOUS | Status: DC | PRN
  Administered 2017-09-24: 210 mL via INTRAVENOUS

## 2017-09-24 MED ORDER — SODIUM CHLORIDE 0.9% FLUSH: 3.0000 mL | Freq: Two times a day (BID) | INTRAVENOUS | Status: DC

## 2017-09-24 MED ORDER — ASPIRIN 81 MG PO CHEW: 81.0000 mg | CHEWABLE_TABLET | ORAL | Status: DC

## 2017-09-24 MED ORDER — FENTANYL CITRATE (PF) 100 MCG/2ML IJ SOLN
INTRAMUSCULAR | Status: AC
  Filled 2017-09-24: qty 2

## 2017-09-24 MED ORDER — HEPARIN (PORCINE) IN NACL 1000-0.9 UT/500ML-% IV SOLN
INTRAVENOUS | Status: AC
  Filled 2017-09-24: qty 1000

## 2017-09-24 MED ORDER — LIDOCAINE HCL (PF) 1 % IJ SOLN
INTRAMUSCULAR | Status: DC | PRN
  Administered 2017-09-24: 2 mL

## 2017-09-24 MED ORDER — LIDOCAINE HCL (PF) 1 % IJ SOLN
INTRAMUSCULAR | Status: AC
  Filled 2017-09-24: qty 30

## 2017-09-24 MED ORDER — SODIUM CHLORIDE 0.9 % WEIGHT BASED INFUSION
3.0000 mL/kg/h | INTRAVENOUS | Status: DC
  Administered 2017-09-24: 3 mL/kg/h via INTRAVENOUS

## 2017-09-24 SURGICAL SUPPLY — 17 items
CATH 5FR JL3.5 JR4 ANG PIG MP (CATHETERS) ×2 IMPLANT
CATH MICROCATH NAVVUS (MICROCATHETER) ×1 IMPLANT
CATH VISTA GUIDE 6FR JL4 (CATHETERS) ×2 IMPLANT
CATH VISTA GUIDE 6FR XBLAD3.0 (CATHETERS) ×2 IMPLANT
CATH VISTA GUIDE 6FR XBLAD3.5 (CATHETERS) ×2 IMPLANT
DEVICE RAD COMP TR BAND LRG (VASCULAR PRODUCTS) ×2 IMPLANT
GLIDESHEATH SLEND A-KIT 6F 22G (SHEATH) ×2 IMPLANT
GUIDEWIRE INQWIRE 1.5J.035X260 (WIRE) ×1 IMPLANT
INQWIRE 1.5J .035X260CM (WIRE) ×2
KIT ESSENTIALS PG (KITS) ×2 IMPLANT
KIT HEART LEFT (KITS) ×2 IMPLANT
MICROCATHETER NAVVUS (MICROCATHETER) ×2
PACK CARDIAC CATHETERIZATION (CUSTOM PROCEDURE TRAY) ×2 IMPLANT
SHEATH PROBE COVER 6X72 (BAG) ×2 IMPLANT
TRANSDUCER W/STOPCOCK (MISCELLANEOUS) ×2 IMPLANT
TUBING CIL FLEX 10 FLL-RA (TUBING) ×2 IMPLANT
WIRE ASAHI PROWATER 180CM (WIRE) ×2 IMPLANT

## 2017-09-24 NOTE — Discharge Instructions (Signed)
Drink plenty of fluids over next 48 hours and keep right wrist elevated at heart level for 24 hours ° °Radial Site Care °Refer to this sheet in the next few weeks. These instructions provide you with information about caring for yourself after your procedure. Your health care provider may also give you more specific instructions. Your treatment has been planned according to current medical practices, but problems sometimes occur. Call your health care provider if you have any problems or questions after your procedure. °What can I expect after the procedure? °After your procedure, it is typical to have the following: °· Bruising at the radial site that usually fades within 1-2 weeks. °· Blood collecting in the tissue (hematoma) that may be painful to the touch. It should usually decrease in size and tenderness within 1-2 weeks. ° °Follow these instructions at home: °· Take medicines only as directed by your health care provider. °· You may shower 24-48 hours after the procedure or as directed by your health care provider. Remove the bandage (dressing) and gently wash the site with plain soap and water. Pat the area dry with a clean towel. Do not rub the site, because this may cause bleeding. °· Do not take baths, swim, or use a hot tub until your health care provider approves. °· Check your insertion site every day for redness, swelling, or drainage. °· Do not apply powder or lotion to the site. °· Do not flex or bend the affected arm for 24 hours or as directed by your health care provider. °· Do not push or pull heavy objects with the affected arm for 24 hours or as directed by your health care provider. °· Do not lift over 10 lb (4.5 kg) for 5 days after your procedure or as directed by your health care provider. °· Ask your health care provider when it is okay to: °? Return to work or school. °? Resume usual physical activities or sports. °? Resume sexual activity. °· Do not drive home if you are discharged the  same day as the procedure. Have someone else drive you. °· You may drive 24 hours after the procedure unless otherwise instructed by your health care provider. °· Do not operate machinery or power tools for 24 hours after the procedure. °· If your procedure was done as an outpatient procedure, which means that you went home the same day as your procedure, a responsible adult should be with you for the first 24 hours after you arrive home. °· Keep all follow-up visits as directed by your health care provider. This is important. °Contact a health care provider if: °· You have a fever. °· You have chills. °· You have increased bleeding from the radial site. Hold pressure on the site. °Get help right away if: °· You have unusual pain at the radial site. °· You have redness, warmth, or swelling at the radial site. °· You have drainage (other than a small amount of blood on the dressing) from the radial site. °· The radial site is bleeding, and the bleeding does not stop after 30 minutes of holding steady pressure on the site. °· Your arm or hand becomes pale, cool, tingly, or numb. °This information is not intended to replace advice given to you by your health care provider. Make sure you discuss any questions you have with your health care provider. °Document Released: 01/31/2010 Document Revised: 06/06/2015 Document Reviewed: 07/17/2013 °Elsevier Interactive Patient Education © 2018 [employer] ° °

## 2017-09-24 NOTE — Op Note (Signed)
   Coronary angiography and LAD FFR via right radial.  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  Anterior to the leftward origin of the right coronary artery.  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  FFR 0.89.  No hematoma or vascular complications.  Aggressive risk factor modification: Smoking cessation, blood pressure less than 130/80 mmHg, LDL less than 70, moderate intensity aerobic activity, and screening/surveillance of glycemic control.

## 2017-09-24 NOTE — Interval H&P Note (Signed)
Cath Lab Visit (complete for each Cath Lab visit)  Clinical Evaluation Leading to the Procedure:   ACS: No.  Non-ACS:    Anginal Classification: CCS III  Anti-ischemic medical therapy: Minimal Therapy (1 class of medications)  Non-Invasive Test Results: Low-risk stress test findings: cardiac mortality <1%/year  Prior CABG: No previous CABG      History and Physical Interval Note:  09/24/2017 7:31 AM  Lee Krause  has presented today for surgery, with the diagnosis of chest pain  The various methods of treatment have been discussed with the patient and family. After consideration of risks, benefits and other options for treatment, the patient has consented to  Procedure(s): LEFT HEART CATH AND CORONARY ANGIOGRAPHY (N/A) as a surgical intervention .  The patient's history has been reviewed, patient examined, no change in status, stable for surgery.  I have reviewed the patient's chart and labs.  Questions were answered to the patient's satisfaction.     Belva Crome III

## 2017-09-27 ENCOUNTER — Telehealth: Payer: Self-pay

## 2017-09-27 MED FILL — Verapamil HCl IV Soln 2.5 MG/ML: INTRAVENOUS | Qty: 2 | Status: AC

## 2017-09-27 NOTE — Telephone Encounter (Signed)
Called patient to check if he was put on Plavix.  Checking his chart for PV I noticed he had a cardiac cath on 9/13 with recommendations to begin Plavix. Left message to call and ask for [facility] nurse.

## 2017-09-27 NOTE — Telephone Encounter (Signed)
Pt returned mycall requesting whether he was on Plavix. He is going for a followup with MD tomorrow and will know then if he'll be placed on Plavix. He stated he will call PV tomorrow and let us know.

## 2017-09-28 ENCOUNTER — Encounter: Payer: Self-pay | Admitting: Physician Assistant

## 2017-09-28 ENCOUNTER — Ambulatory Visit: Payer: Managed Care, Other (non HMO) | Admitting: Physician Assistant

## 2017-09-28 VITALS — BP 110/68 | HR 95 | Ht 76.0 in | Wt 228.8 lb

## 2017-09-28 DIAGNOSIS — I10 Essential (primary) hypertension: Secondary | ICD-10-CM

## 2017-09-28 DIAGNOSIS — E785 Hyperlipidemia, unspecified: Secondary | ICD-10-CM | POA: Insufficient documentation

## 2017-09-28 DIAGNOSIS — I2510 Atherosclerotic heart disease of native coronary artery without angina pectoris: Secondary | ICD-10-CM | POA: Diagnosis not present

## 2017-09-28 DIAGNOSIS — Z8249 Family history of ischemic heart disease and other diseases of the circulatory system: Secondary | ICD-10-CM | POA: Diagnosis not present

## 2017-09-28 DIAGNOSIS — F17200 Nicotine dependence, unspecified, uncomplicated: Secondary | ICD-10-CM

## 2017-09-28 DIAGNOSIS — E782 Mixed hyperlipidemia: Secondary | ICD-10-CM

## 2017-09-28 MED ORDER — CLOPIDOGREL BISULFATE 75 MG PO TABS: 75.0000 mg | ORAL_TABLET | Freq: Every day | ORAL | 3 refills | Status: DC

## 2017-09-28 NOTE — Patient Instructions (Signed)
Medication Instructions:  Your physician has recommended you make the following change in your medication:   1. START: metoprolol succinate (Toprol) 25 mg tablet: Take 1 tablet by mouth once a day  2. START: clopidogrel (plavix) 75 mg tablet: Take 1 tablet by mouth once a day (You may start this after you have had your colonoscopy on 10/16/17)  Labwork: None ordered  Testing/Procedures: None ordered  Follow-Up: Your physician wants you to follow-up with Dr. Irish Lack on 12/30/17 at 3:20 PM  Any Other Special Instructions Will Be Listed Below (If Applicable).     If you need a refill on your cardiac medications before your next appointment, please call your pharmacy.

## 2017-09-28 NOTE — Progress Notes (Signed)
Cardiology Office Note    Date:  09/28/2017   ID:  Lee Krause, DOB Jun 10, 1965, MRN [id number]  PCP:  Wendie Agreste, MD  Cardiologist: Larae Grooms, MD EPS: None  Chief Complaint  Patient presents with  . Follow-up    History of Present Illness:  Lee Krause is a 52 y.o. male with history of atypical chest pain, family history of early CAD with brother dying of an MI at age 46, hypertension, HLD, tobacco abuse.  Nuclear stress test 03/2017 low normal EF 50% hypertensive response to exercise low risk study, 2D echo normal.  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  The artery bruit was widely patent and dominant.  50% ostial OM, eccentric 50% proximal to mid LAD FFR performed and not hemodynamically significant, normal LVEF 55%.  Aggressive risk factor modification with high intensity statin with LDL goal of less than 70, good blood pressure control smoking cessation and aspirin 81 mg daily recommended.  Consider adding Plavix 75 mg/day given family history and other risk factors until risk preventive measures are fully cooperative.  Patient comes in for f/u. Quit smoking and drinking.  Wants paperwork filled out for work restrictions as he has not been able to keep up at work.  Taking Lipitor now.  Has not filled metoprolol yet.  Has not filled Plavix yet.  Scheduled for colonoscopy and endoscopy October 5.  A lot of stress and anxiety taking care of his 43-year-old son and 50-year-old grandson.    Past Medical History:  Diagnosis Date  . Chest pain   . Gastritis   . GERD (gastroesophageal reflux disease)   . Tobacco abuse     Past Surgical History:  Procedure Laterality Date  . INTRAVASCULAR PRESSURE WIRE/FFR STUDY N/A 09/24/2017   Procedure: INTRAVASCULAR PRESSURE WIRE/FFR STUDY;  Surgeon: Belva Crome, MD;  Location: [facility];  Service: Cardiovascular;  Laterality: N/A;  . LEFT HEART CATH AND CORONARY ANGIOGRAPHY N/A 09/24/2017   Procedure: LEFT HEART CATH AND CORONARY ANGIOGRAPHY;  Surgeon: Belva Crome, MD;  Location: [facility];  Service: Cardiovascular;  Laterality: N/A;  . TOOTH EXTRACTION      Current Medications: Current Meds  Medication Sig  . aspirin EC 81 MG tablet Take 81 mg by mouth daily.  Marland Kitchen atorvastatin (LIPITOR) 40 MG tablet Take 1 tablet (40 mg total) by mouth daily at 6 PM.  . hydrochlorothiazide (MICROZIDE) 12.5 MG capsule Take 1 capsule (12.5 mg total) by mouth daily.  . metoprolol succinate (TOPROL-XL) 25 MG 24 hr tablet Take 1 tablet by mouth daily.  . Multiple Vitamin (MULTIVITAMIN WITH MINERALS) TABS tablet Take 1 tablet by mouth daily.  . [DISCONTINUED] omeprazole (PRILOSEC OTC) 20 MG tablet Take 20 mg by mouth daily as needed (acid reflux).     Allergies:   Patient has no known allergies.   Social History   Socioeconomic History  . Marital status: Married    Spouse name: Not on file  . Number of children: Not on file  . Years of education: Not on file  . Highest education level: Not on file  Occupational History  . Not on file  Social Needs  . Financial resource strain: Not on file  . Food insecurity:    Worry: Not on file    Inability: Not  on file  . Transportation needs:    Medical: Not on file    Non-medical: Not on file  Tobacco Use  . Smoking status: Current Every Day Smoker    Packs/day: 0.25    Types: Cigarettes  . Smokeless tobacco: Never Used  Substance and Sexual Activity  . Alcohol use: Yes    Comment: Most days; after work  . Drug use: No  . Sexual activity: Yes  Lifestyle  . Physical activity:    Days per week: Not on file    Minutes per session: Not on file  . Stress: Not on file  Relationships  . Social connections:    Talks on phone: Not on file    Gets together: Not on file    Attends  religious service: Not on file    Active member of club or organization: Not on file    Attends meetings of clubs or organizations: Not on file    Relationship status: Not on file  Other Topics Concern  . Not on file  Social History Narrative  . Not on file     Family History:  The patient's family history includes Heart attack in his brother; Hypertension in his father.   ROS:   Please see the history of present illness.    Review of Systems  Constitution: Negative.  HENT: Negative.   Cardiovascular: Positive for chest pain.  Respiratory: Negative.   Endocrine: Negative.   Hematologic/Lymphatic: Negative.   Musculoskeletal: Negative.   Gastrointestinal: Negative.   Genitourinary: Negative.   Neurological: Negative.   Psychiatric/Behavioral: The patient is nervous/anxious.    All other systems reviewed and are negative.   PHYSICAL EXAM:   VS:  BP 110/68   Pulse 95   Ht 6\' 4"  (1.93 m)   Wt 228 lb 12.8 oz (103.8 kg)   SpO2 97%   BMI 27.85 kg/m   Physical Exam  GEN: Well nourished, well developed, in no acute distress  Neck: no JVD, carotid bruits, or masses Cardiac:RRR; no murmurs, rubs, or gallops  Respiratory:  clear to auscultation bilaterally, normal work of breathing GI: soft, nontender, nondistended, + BS Ext: Right arm at cath site without hematoma or hemorrhage, lower extremities without cyanosis, clubbing, or edema, Good distal pulses bilaterally Neuro:  Alert and Oriented x 3 Psych: euthymic mood, full affect  Wt Readings from Last 3 Encounters:  09/28/17 228 lb 12.8 oz (103.8 kg)  09/24/17 227 lb (103 kg)  09/10/17 227 lb (103 kg)      Studies/Labs Reviewed:   EKG:  EKG is not ordered today.    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  The artery is widely patent and dominant.  Normal left main.  Circumflex coronary artery is widely patent but there is ostial 50% narrowing in the first obtuse marginal.  Eccentric 50% stenosis in proximal to  mid LAD after the large first diagonal.  Because of the eccentricity and current clinical presentation, FFR was performed and demonstrated that the lesion was not hemodynamically significant with a value of 0.89.  Normal LV function.  Normal LVEDP.  Estimated EF 55%.   RECOMMENDATIONS:    Aggressive risk factor modification: High intensity statin therapy to reduce LDL less than 70; blood pressure less than 130/80 mmHg; smoking cessation; moderate intensity aerobic activity greater than or equal to 150 minutes/week; and, close monitoring of glycemic control.  Seems it would be safe to discontinue long-acting nitrates since they are not helping and causing headache of significance.  Consider adding Plavix 75 mg/day given family history and other risk factors, until risk preventative measures are fully cooperative.     Recommend Aspirin 81mg  daily for moderate CAD.   Nuclear stress test 3/28/2019The left ventricular ejection fraction is mildly decreased (45-54%).  Nuclear stress EF: 50%.  Blood pressure demonstrated a hypertensive response to exercise.  There was no ST segment deviation noted during stress.  This is a low risk study.   Low risk, probably normal stress nuclear study with no ischemia or infarction; EF mildly reduced at 50 but visually appears normal; mild LVE; suggest echocardiogram to further  assess LV function.    2D echo 3/25/2019Study Conclusions   - Left ventricle: The cavity size was normal. There was mild focal   basal hypertrophy of the septum. Systolic function was normal.   The estimated ejection fraction was in the range of 55% to 60%.   Wall motion was normal; there were no regional wall motion   abnormalities. Left ventricular diastolic function parameters   were normal.       ASSESSMENT:    1. Coronary artery disease involving native coronary artery of native heart without angina pectoris   2. Essential hypertension   3. Family history of ischemic heart disease   4. Tobacco use disorder   5. Mixed hyperlipidemia      PLAN:  In order of problems listed above:  CAD with long history of atypical chest pain.  Cardiac cath 09/24/2017 with nonobstructive disease but 50% OM and LAD.  Aggressive risk factor modification recommended.  Dr. Tamala Julian felt Plavix should be considered until all risk factors are controlled.  Patient is willing to start this but will wait until after his colonoscopy and endoscopy October 5.  Follow-up with Dr. Irish Lack in a couple months.  Note given for restrictions at work temporarily.  Essential hypertension blood pressure stable today.  Has not filled metoprolol but recommend that he do.  Family history of early ischemic heart disease with brother dying and 4 of an MI  Tobacco abuse with smoking.  Mixed hyperlipidemia back on Lipitor 40 mg.  Will recheck fasting lipid panel and LFTs in 3 months.    Medication Adjustments/Labs and Tests Ordered: Current medicines are reviewed at length with the patient today.  Concerns regarding medicines are outlined above.  Medication changes, Labs and Tests ordered today are listed in the Patient Instructions below. Patient Instructions  Medication Instructions:  Your physician has recommended you make the following change in your medication:   1. START: metoprolol succinate (Toprol) 25 mg  tablet: Take 1 tablet by mouth once a day  2. START: clopidogrel (plavix) 75 mg tablet: Take 1 tablet by mouth once a day (You may start this after you have had your colonoscopy on 10/16/17)  Labwork: None ordered  Testing/Procedures: None ordered  Follow-Up: Your physician  wants you to follow-up with Dr. Irish Lack on 12/30/17 at 3:20 PM  Any Other Special Instructions Will Be Listed Below (If Applicable).     If you need a refill on your cardiac medications before your next appointment, please call your pharmacy.      Sumner Boast, PA-C  09/28/2017 12:23 PM    [facility] [address], [place], [place]  [address] Phone: [phone]; Fax: [phone]

## 2017-09-28 NOTE — Telephone Encounter (Signed)
Spoke with pt and he states he will not go back on his Plavix until 10/16/17 per Dr Estella Husk. He will be coming in on 09/30/17 for his PV  and to get his prep instructions prior to his colon on 10/14/17. He will call back with any other questions. Gwyndolyn Saxon

## 2017-09-29 ENCOUNTER — Telehealth: Payer: Self-pay | Admitting: *Deleted

## 2017-09-29 NOTE — Telephone Encounter (Signed)
Dr Henrene Pastor,  This pt is scheduled for a PV 09-30-17 for a ECL with you 10-14-17 Thursday.  He was seen by Nevin Bloodgood 06-02-2016 after a GI bleed , he was scheduled for a ECL 07-2016 but cancelled due to no insurance,  That was 16 months ago- he never had a colon/ egd at that time-  He has no had any GI office visits since 05-2016///  he is back on the schedule for a ECL for gi bleed and ETOH abuse.  He had a cardiac cath 09-24-2017 with a EF of 55%- It was recommended he start Plavix- he saw Cardio yesterday, and has agreed to start Plavix after the ECL.  - he has had increased cardiac issues this year-    Does he need another OV and also Does he need an ECL now after 16 months ?  Please advise and thanks for your time, Lelan Pons

## 2017-09-30 ENCOUNTER — Ambulatory Visit (AMBULATORY_SURGERY_CENTER): Payer: Self-pay

## 2017-09-30 ENCOUNTER — Encounter: Payer: Self-pay | Admitting: Internal Medicine

## 2017-09-30 VITALS — Ht 76.0 in | Wt 227.4 lb

## 2017-09-30 DIAGNOSIS — K922 Gastrointestinal hemorrhage, unspecified: Secondary | ICD-10-CM

## 2017-09-30 MED ORDER — NA SULFATE-K SULFATE-MG SULF 17.5-3.13-1.6 GM/177ML PO SOLN: 1.0000 | Freq: Once | ORAL | 0 refills | Status: AC

## 2017-09-30 NOTE — Progress Notes (Signed)
Denies allergies to eggs or soy products. Denies complication of anesthesia or sedation. Denies use of weight loss medication. Denies use of O2.   Emmi instructions declined.   A 50.00 coupon was given to the patient.

## 2017-09-30 NOTE — Telephone Encounter (Signed)
Chart reviewed. Yes, proceed with ECL. Thanks

## 2017-09-30 NOTE — Telephone Encounter (Signed)
Will proceed as scheduled  

## 2017-10-07 ENCOUNTER — Encounter (HOSPITAL_COMMUNITY): Payer: Self-pay | Admitting: Interventional Cardiology

## 2017-10-14 ENCOUNTER — Encounter: Payer: Self-pay | Admitting: Internal Medicine

## 2017-10-14 ENCOUNTER — Ambulatory Visit (AMBULATORY_SURGERY_CENTER): Payer: Managed Care, Other (non HMO) | Admitting: Internal Medicine

## 2017-10-14 VITALS — BP 124/85 | HR 71 | Temp 99.3°F | Resp 16 | Ht 76.0 in | Wt 227.0 lb

## 2017-10-14 DIAGNOSIS — K922 Gastrointestinal hemorrhage, unspecified: Secondary | ICD-10-CM

## 2017-10-14 DIAGNOSIS — D122 Benign neoplasm of ascending colon: Secondary | ICD-10-CM

## 2017-10-14 DIAGNOSIS — Z1211 Encounter for screening for malignant neoplasm of colon: Secondary | ICD-10-CM | POA: Diagnosis present

## 2017-10-14 MED ORDER — SODIUM CHLORIDE 0.9 % IV SOLN: 500.0000 mL | Freq: Once | INTRAVENOUS | Status: DC

## 2017-10-14 NOTE — Op Note (Signed)
[place] Patient Name: Lee Krause Procedure Date: 10/14/2017 3:00 PM MRN: [id number] Endoscopist: Docia Chuck. Henrene Pastor , MD Age: 52 Referring MD:  Date of Birth: 31-May-1965 Gender: Male Account #: [id number] Procedure:                Upper GI endoscopy Indications:              Melena Medicines:                Monitored Anesthesia Care Procedure:                Pre-Anesthesia Assessment:                           - Prior to the procedure, a History and Physical                            was performed, and patient medications and                            allergies were reviewed. The patient's tolerance of                            previous anesthesia was also reviewed. The risks                            and benefits of the procedure and the sedation                            options and risks were discussed with the patient.                            All questions were answered, and informed consent                            was obtained. Prior Anticoagulants: The patient has                            taken no previous anticoagulant or antiplatelet                            agents. ASA Grade Assessment: II - A patient with                            mild systemic disease. After reviewing the risks                            and benefits, the patient was deemed in                            satisfactory condition to undergo the procedure.                           After obtaining informed consent, the endoscope was  passed under direct vision. Throughout the                            procedure, the patient's blood pressure, pulse, and                            oxygen saturations were monitored continuously. The                            Endoscope was introduced through the mouth, and                            advanced to the second part of duodenum. The upper                            GI endoscopy was accomplished without difficulty.                            The patient tolerated the procedure well. Scope In: Scope Out: Findings:                 The esophagus was normal.                           The stomach was normal.                           The examined duodenum was normal.                           The cardia and gastric fundus were normal on                            retroflexion. Complications:            No immediate complications. Estimated Blood Loss:     Estimated blood loss: none. Impression:               - Normal esophagus.                           - Normal stomach.                           - Normal examined duodenum.                           - No specimens collected. Recommendation:           - Patient has a contact number available for                            emergencies. The signs and symptoms of potential                            delayed complications were discussed with the  patient. Return to normal activities tomorrow.                            Written discharge instructions were provided to the                            patient.                           - Resume previous diet.                           - Continue present medications. Docia Chuck. Henrene Pastor, MD 10/14/2017 3:34:09 PM This report has been signed electronically.

## 2017-10-14 NOTE — Progress Notes (Signed)
Called to room to assist during endoscopic procedure.  Patient ID and intended procedure confirmed with present staff. Received instructions for my participation in the procedure from the performing physician.  

## 2017-10-14 NOTE — Patient Instructions (Signed)
Continue present medications. Please read handouts provided.      YOU HAD AN ENDOSCOPIC PROCEDURE TODAY AT [facility]:   Refer to the procedure report that was given to you for any specific questions about what was found during the examination.  If the procedure report does not answer your questions, please call your gastroenterologist to clarify.  If you requested that your care partner not be given the details of your procedure findings, then the procedure report has been included in a sealed envelope for you to review at your convenience later.  YOU SHOULD EXPECT: Some feelings of bloating in the abdomen. Passage of more gas than usual.  Walking can help get rid of the air that was put into your GI tract during the procedure and reduce the bloating. If you had a lower endoscopy (such as a colonoscopy or flexible sigmoidoscopy) you may notice spotting of blood in your stool or on the toilet paper. If you underwent a bowel prep for your procedure, you may not have a normal bowel movement for a few days.  Please Note:  You might notice some irritation and congestion in your nose or some drainage.  This is from the oxygen used during your procedure.  There is no need for concern and it should clear up in a day or so.  SYMPTOMS TO REPORT IMMEDIATELY:   Following lower endoscopy (colonoscopy or flexible sigmoidoscopy):  Excessive amounts of blood in the stool  Significant tenderness or worsening of abdominal pains  Swelling of the abdomen that is new, acute  Fever of 100F or higher   Following upper endoscopy (EGD)  Vomiting of blood or coffee ground material  New chest pain or pain under the shoulder blades  Painful or persistently difficult swallowing  New shortness of breath  Fever of 100F or higher  Black, tarry-looking stools  For urgent or emergent issues, a gastroenterologist can be reached at any hour by calling [phone].   DIET:  We do recommend a  small meal at first, but then you may proceed to your regular diet.  Drink plenty of fluids but you should avoid alcoholic beverages for 24 hours.  ACTIVITY:  You should plan to take it easy for the rest of today and you should NOT DRIVE or use heavy machinery until tomorrow (because of the sedation medicines used during the test).    FOLLOW UP: Our staff will call the number listed on your records the next business day following your procedure to check on you and address any questions or concerns that you may have regarding the information given to you following your procedure. If we do not reach you, we will leave a message.  However, if you are feeling well and you are not experiencing any problems, there is no need to return our call.  We will assume that you have returned to your regular daily activities without incident.  If any biopsies were taken you will be contacted by phone or by letter within the next 1-3 weeks.  Please call us at [phone] if you have not heard about the biopsies in 3 weeks.    SIGNATURES/CONFIDENTIALITY: You and/or your care partner have signed paperwork which will be entered into your electronic medical record.  These signatures attest to the fact that that the information above on your After Visit Summary has been reviewed and is understood.  Full responsibility of the confidentiality of this discharge information lies with you and/or your care-partner.

## 2017-10-14 NOTE — Progress Notes (Signed)
Pt's states no medical or surgical changes since previsit or office visit. 

## 2017-10-14 NOTE — Op Note (Signed)
[place] Patient Name: Lee Krause Procedure Date: 10/14/2017 3:02 PM MRN: [id number] Endoscopist: Docia Chuck. Henrene Pastor , MD Age: 52 Referring MD:  Date of Birth: 22-Nov-1965 Gender: Male Account #: [id number] Procedure:                Colonoscopy with cold snare polypectomy x 2 Indications:              Screening for colorectal malignant neoplasm Medicines:                Monitored Anesthesia Care Procedure:                Pre-Anesthesia Assessment:                           - Prior to the procedure, a History and Physical                            was performed, and patient medications and                            allergies were reviewed. The patient's tolerance of                            previous anesthesia was also reviewed. The risks                            and benefits of the procedure and the sedation                            options and risks were discussed with the patient.                            All questions were answered, and informed consent                            was obtained. Prior Anticoagulants: The patient has                            taken no previous anticoagulant or antiplatelet                            agents. ASA Grade Assessment: II - A patient with                            mild systemic disease. After reviewing the risks                            and benefits, the patient was deemed in                            satisfactory condition to undergo the procedure.                           After obtaining informed consent, the colonoscope  was passed under direct vision. Throughout the                            procedure, the patient's blood pressure, pulse, and                            oxygen saturations were monitored continuously. The                            Model CF-HQ190L [id number]) scope was introduced                            through the anus and advanced to the the cecum,             identified by appendiceal orifice and ileocecal                            valve. The ileocecal valve, appendiceal orifice,                            and rectum were photographed. The quality of the                            bowel preparation was excellent. The colonoscopy                            was performed without difficulty. The patient                            tolerated the procedure well. The bowel preparation                            used was SUPREP. Scope In: 3:10:23 PM Scope Out: 3:22:38 PM Scope Withdrawal Time: 0 hours 10 minutes 26 seconds  Total Procedure Duration: 0 hours 12 minutes 15 seconds  Findings:                 Two polyps were found in the ascending colon. The                            polyps were 2 to 4 mm in size. These polyps were                            removed with a cold snare. Resection and retrieval                            were complete.                           Multiple diverticula were found in the transverse                            colon and left colon.  Internal hemorrhoids were found during retroflexion.                           The exam was otherwise without abnormality on                            direct and retroflexion views. Complications:            No immediate complications. Estimated blood loss:                            None. Estimated Blood Loss:     Estimated blood loss: none. Impression:               - Two 2 to 4 mm polyps in the ascending colon,                            removed with a cold snare. Resected and retrieved.                           - Diverticulosis in the transverse colon and in the                            left colon.                           - Internal hemorrhoids.                           - The examination was otherwise normal on direct                            and retroflexion views. Recommendation:           - Repeat colonoscopy in 5 years for  surveillance.                           - Patient has a contact number available for                            emergencies. The signs and symptoms of potential                            delayed complications were discussed with the                            patient. Return to normal activities tomorrow.                            Written discharge instructions were provided to the                            patient.                           - Resume previous diet.                           -  Continue present medications.                           - Await pathology results. Docia Chuck. Henrene Pastor, MD 10/14/2017 3:32:35 PM This report has been signed electronically.

## 2017-10-14 NOTE — Progress Notes (Signed)
Report to PACU, RN, vss, BBS= Clear.  

## 2017-10-15 ENCOUNTER — Telehealth: Payer: Self-pay | Admitting: *Deleted

## 2017-10-15 NOTE — Telephone Encounter (Signed)
  Follow up Call-  Call back number 10/14/2017  Post procedure Call Back phone  # [phone]  Permission to leave phone message Yes  Some recent data might be hidden     Patient questions:on f/u call ,pt says he has not had any problems  after his procedure ,denies any discomfort or fever or problems eating.

## 2017-10-20 ENCOUNTER — Encounter: Payer: Self-pay | Admitting: Internal Medicine

## 2017-12-30 ENCOUNTER — Ambulatory Visit: Payer: Managed Care, Other (non HMO) | Admitting: Interventional Cardiology

## 2018-01-17 ENCOUNTER — Ambulatory Visit (INDEPENDENT_AMBULATORY_CARE_PROVIDER_SITE_OTHER): Payer: 59 | Admitting: Interventional Cardiology

## 2018-01-17 ENCOUNTER — Encounter: Payer: Self-pay | Admitting: Interventional Cardiology

## 2018-01-17 VITALS — BP 120/78 | HR 81 | Ht 76.0 in | Wt 233.0 lb

## 2018-01-17 DIAGNOSIS — I10 Essential (primary) hypertension: Secondary | ICD-10-CM

## 2018-01-17 DIAGNOSIS — I2510 Atherosclerotic heart disease of native coronary artery without angina pectoris: Secondary | ICD-10-CM

## 2018-01-17 DIAGNOSIS — F17200 Nicotine dependence, unspecified, uncomplicated: Secondary | ICD-10-CM | POA: Diagnosis not present

## 2018-01-17 DIAGNOSIS — Z8249 Family history of ischemic heart disease and other diseases of the circulatory system: Secondary | ICD-10-CM

## 2018-01-17 DIAGNOSIS — E782 Mixed hyperlipidemia: Secondary | ICD-10-CM

## 2018-01-17 NOTE — Patient Instructions (Signed)
Medication Instructions:  Your physician recommends that you continue on your current medications as directed. Please refer to the Current Medication list given to you today.  If you need a refill on your cardiac medications before your next appointment, please call your pharmacy.   Lab work: Your physician recommends that you return for a FASTING lipid profile and complete metabolic panel   If you have labs (blood work) drawn today and your tests are completely normal, you will receive your results only by: Marland Kitchen MyChart Message (if you have MyChart) OR . A paper copy in the mail If you have any lab test that is abnormal or we need to change your treatment, we will call you to review the results.  Testing/Procedures: None ordered  Follow-Up: At [facility], you and your health needs are our priority.  As part of our continuing mission to provide you with exceptional heart care, we have created designated Provider Care Teams.  These Care Teams include your primary Cardiologist (physician) and Advanced Practice Providers (APPs -  Physician Assistants and Nurse Practitioners) who all work together to provide you with the care you need, when you need it. . You will need a follow up appointment in 6 months.  Please call our office 2 months in advance to schedule this appointment.  You may see Casandra Doffing, MD or one of the following Advanced Practice Providers on your designated Care Team:   . Lyda Jester, PA-C . Dayna Dunn, PA-C . Ermalinda Barrios, PA-C  Any Other Special Instructions Will Be Listed Below (If Applicable).

## 2018-01-17 NOTE — Progress Notes (Signed)
Cardiology Office Note   Date:  01/17/2018   ID:  Lee Krause, DOB Jul 17, 1965, MRN [id number]  PCP:  Wendie Agreste, MD    No chief complaint on file.  CAD  Wt Readings from Last 3 Encounters:  01/17/18 233 lb (105.7 kg)  10/14/17 227 lb (103 kg)  09/30/17 227 lb 6.4 oz (103.1 kg)       History of Present Illness: Lee Krause is a 53 y.o. male  with history of atypical chest pain, family history of early CAD with brother dying of an MI at age 30, hypertension, HLD, tobacco abuse.  Nuclear stress test 03/2017 low normal EF 50% hypertensive response to exercise low risk study, 2D Lee normal.  Patient continued to have chest pains a cardiac catheterization was recommended and performed on 09/24/2017 by Dr. 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    Normal left main.  Circumflex coronary artery is widely patent but there is ostial 50% narrowing in the first obtuse marginal.  Eccentric 50% stenosis in proximal to mid LAD after the large first diagonal.  Because of the eccentricity and current clinical presentation, FFR was performed and demonstrated that the lesion was not hemodynamically significant with a value of 0.89.  Normal LV function.  Normal LVEDP.  Estimated EF 55%.  RECOMMENDATIONS:   Aggressive risk factor modification: High intensity statin therapy to reduce LDL less than 70; blood pressure less than 130/80 mmHg; smoking cessation; moderate intensity aerobic activity greater than or equal to 150 minutes/week; and, close monitoring of glycemic control.  Seems it would be safe to discontinue long-acting nitrates since they are not helping and causing headache of significance.  Consider adding Plavix 75 mg/day given family history and other risk factors,  No bleeding issues.  He continues to have some intermittent chest discomfort and SHOB- never went away after the procedure.  He did not  tolerate Imdur.  His job is physically strenuous.  He had been exercising and dieting.  He stopped being as strict.  He was smoking a little more, but stopped completely.   He was limited to 6 hours /day for work.  He wants to go back to 8 hrs/day.  Even walking continuously will be difficult since he has to "keep up with 72 year olds."   Past Medical History:  Diagnosis Date  . Allergy   . Anemia   . Arthritis   . Chest pain   . Gastritis   . Gastrointestinal hemorrhage   . GERD (gastroesophageal reflux disease)   . Hyperlipidemia   . Hypertension   . Tobacco abuse     Past Surgical History:  Procedure Laterality Date  . INTRAVASCULAR PRESSURE WIRE/FFR STUDY N/A 09/24/2017   Procedure: INTRAVASCULAR PRESSURE WIRE/FFR STUDY;  Surgeon: Belva Crome, MD;  Location: [place] [facility];  Service: Cardiovascular;  Laterality: N/A;  . LEFT HEART CATH AND CORONARY ANGIOGRAPHY N/A 09/24/2017   Procedure: LEFT HEART CATH AND CORONARY ANGIOGRAPHY;  Surgeon: Belva Crome, MD;  Location: [place] [facility];  Service: Cardiovascular;  Laterality: N/A;  . TOOTH EXTRACTION       Current Outpatient Medications  Medication Sig Dispense Refill  . acetaminophen (TYLENOL) 650 MG CR tablet Take 650 mg by mouth every 8 (eight) hours as needed for pain.    Marland Kitchen aspirin EC 81 MG tablet Take 81 mg by mouth daily.    Marland Kitchen atorvastatin (  LIPITOR) 40 MG tablet Take 1 tablet (40 mg total) by mouth daily at 6 PM. 90 tablet 1  . clopidogrel (PLAVIX) 75 MG tablet Take 1 tablet (75 mg total) by mouth daily. 90 tablet 3  . hydrochlorothiazide (MICROZIDE) 12.5 MG capsule Take 1 capsule (12.5 mg total) by mouth daily. 90 capsule 1  . metoprolol succinate (TOPROL-XL) 25 MG 24 hr tablet Take 1 tablet by mouth daily.    . Multiple Vitamin (MULTIVITAMIN WITH MINERALS) TABS tablet Take 1 tablet by mouth daily.     No current facility-administered medications for this visit.     Allergies:   Patient has no known  allergies.    Social History:  The patient  reports that he has quit smoking. His smoking use included cigarettes. He smoked 0.25 packs per day. He has never used smokeless tobacco. He reports current alcohol use. He reports that he does not use drugs.   Family History:  The patient's family history includes Heart attack in his brother; Hypertension in his father.    ROS:  Please see the history of present illness.   Otherwise, review of systems are positive for occasional chest tightness.   All other systems are reviewed and negative.    PHYSICAL EXAM: VS:  BP 120/78   Pulse 81   Ht 6\' 4"  (1.93 m)   Wt 233 lb (105.7 kg)   SpO2 97%   BMI 28.36 kg/m  , BMI Body mass index is 28.36 kg/m. GEN: Well nourished, well developed, in no acute distress  HEENT: normal  Neck: no JVD, carotid bruits, or masses Cardiac: RRR; no murmurs, rubs, or gallops,no edema  Respiratory:  clear to auscultation bilaterally, normal work of breathing GI: soft, nontender, nondistended, + BS MS: no deformity or atrophy  Skin: warm and dry, no rash Neuro:  Strength and sensation are intact Psych: euthymic mood, full affect   Recent Labs: 08/05/2017: ALT 17; TSH 1.730 09/09/2017: BUN 16; Creatinine, Ser 0.99; Hemoglobin 14.0; Platelets 277; Potassium 3.7; Sodium 139   Lipid Panel    Component Value Date/Time   CHOL 233 (H) 08/05/2017 1508   TRIG 285 (H) 08/05/2017 1508   HDL 57 08/05/2017 1508   CHOLHDL 4.1 08/05/2017 1508   CHOLHDL 4.1 04/06/2017 0539   VLDL 30 04/06/2017 0539   LDLCALC 119 (H) 08/05/2017 1508   LDLDIRECT 130.6 11/25/2012 1540     Other studies Reviewed: Additional studies/ records that were reviewed today with results demonstrating: hospital cath records .   ASSESSMENT AND PLAN:  1. CAD: Some anginal sx.  Fairly well controlled.  Work restriction given to "avoid production, per his request." 2. Essential hypertension: The current medical regimen is effective;  continue present  plan and medications. 3. Family h/o CAD: Brother with MI at age 80.  84. Tobacco use disorder: He has quit recently.   5. Hyperlipidemia: LDL was 119 in 7/19.  Recheck when fasting.    Current medicines are reviewed at length with the patient today.  The patient concerns regarding his medicines were addressed.  The following changes have been made:  No change  Labs/ tests ordered today include:  No orders of the defined types were placed in this encounter.   Recommend 150 minutes/week of aerobic exercise Low fat, low carb, high fiber diet recommended  Disposition:   FU in 6 months   Signed, Larae Grooms, MD  01/17/2018 1:56 PM    [facility] [address],  [place]  [address] Phone: [phone]; Fax: [phone]

## 2018-01-28 ENCOUNTER — Telehealth: Payer: Self-pay | Admitting: Interventional Cardiology

## 2018-01-28 NOTE — Telephone Encounter (Signed)
New Message   Estill Bamberg from [employer] at [employer] is calling to verify some restrictions for the pt. Please call

## 2018-02-03 NOTE — Telephone Encounter (Signed)
  Estill Bamberg with [employer] is calling again to get some clarification on restrictions

## 2018-02-15 NOTE — Telephone Encounter (Signed)
No request for medical records.

## 2018-03-14 ENCOUNTER — Telehealth: Payer: Self-pay | Admitting: Interventional Cardiology

## 2018-03-14 NOTE — Telephone Encounter (Signed)
Returned call to [place] and made her aware that the patient was cleared to return to work working 10 hrs a day. Made her aware that the patient should avoid rapid continuous movement and strenuous activity. She verbalized understanding.

## 2018-03-14 NOTE — Telephone Encounter (Signed)
New Message   Lee Krause is calling because she has a return to work with restrictions note and she is following up to get some certification. Please call back

## 2018-05-25 ENCOUNTER — Emergency Department (HOSPITAL_COMMUNITY)
Admission: EM | Admit: 2018-05-25 | Discharge: 2018-05-25 | Disposition: A | Discharge status: Home / Self Care | Payer: 59 | Attending: Emergency Medicine | Admitting: Emergency Medicine

## 2018-05-25 ENCOUNTER — Emergency Department (HOSPITAL_COMMUNITY): Payer: 59

## 2018-05-25 ENCOUNTER — Other Ambulatory Visit: Payer: Self-pay

## 2018-05-25 ENCOUNTER — Encounter (HOSPITAL_COMMUNITY): Payer: Self-pay

## 2018-05-25 ENCOUNTER — Ambulatory Visit: Payer: Self-pay | Admitting: *Deleted

## 2018-05-25 DIAGNOSIS — Z79899 Other long term (current) drug therapy: Secondary | ICD-10-CM | POA: Diagnosis not present

## 2018-05-25 DIAGNOSIS — Z87891 Personal history of nicotine dependence: Secondary | ICD-10-CM | POA: Insufficient documentation

## 2018-05-25 DIAGNOSIS — G510 Bell's palsy: Secondary | ICD-10-CM | POA: Diagnosis not present

## 2018-05-25 DIAGNOSIS — I2510 Atherosclerotic heart disease of native coronary artery without angina pectoris: Secondary | ICD-10-CM | POA: Diagnosis not present

## 2018-05-25 DIAGNOSIS — I10 Essential (primary) hypertension: Secondary | ICD-10-CM | POA: Diagnosis not present

## 2018-05-25 DIAGNOSIS — Z7982 Long term (current) use of aspirin: Secondary | ICD-10-CM | POA: Insufficient documentation

## 2018-05-25 DIAGNOSIS — R51 Headache: Secondary | ICD-10-CM | POA: Insufficient documentation

## 2018-05-25 DIAGNOSIS — R29810 Facial weakness: Secondary | ICD-10-CM | POA: Diagnosis present

## 2018-05-25 HISTORY — DX: Other forms of angina pectoris: I20.8

## 2018-05-25 HISTORY — DX: Other forms of angina pectoris: I20.89

## 2018-05-25 LAB — CBC WITH DIFFERENTIAL/PLATELET
Abs Immature Granulocytes: 0.02 10*3/uL (ref 0.00–0.07)
Basophils Absolute: 0 10*3/uL (ref 0.0–0.1)
Basophils Relative: 0 %
Eosinophils Absolute: 0.4 10*3/uL (ref 0.0–0.5)
Eosinophils Relative: 5 %
HCT: 40.1 % (ref 39.0–52.0)
Hemoglobin: 13.7 g/dL (ref 13.0–17.0)
Immature Granulocytes: 0 %
Lymphocytes Relative: 32 %
Lymphs Abs: 2.8 10*3/uL (ref 0.7–4.0)
MCH: 30.9 pg (ref 26.0–34.0)
MCHC: 34.2 g/dL (ref 30.0–36.0)
MCV: 90.3 fL (ref 80.0–100.0)
Monocytes Absolute: 0.7 10*3/uL (ref 0.1–1.0)
Monocytes Relative: 8 %
Neutro Abs: 4.8 10*3/uL (ref 1.7–7.7)
Neutrophils Relative %: 55 %
Platelets: 281 10*3/uL (ref 150–400)
RBC: 4.44 MIL/uL (ref 4.22–5.81)
RDW: 12.3 % (ref 11.5–15.5)
WBC: 8.8 10*3/uL (ref 4.0–10.5)
nRBC: 0 % (ref 0.0–0.2)

## 2018-05-25 LAB — BASIC METABOLIC PANEL
Anion gap: 9 (ref 5–15)
BUN: 13 mg/dL (ref 6–20)
CO2: 25 mmol/L (ref 22–32)
Calcium: 9.1 mg/dL (ref 8.9–10.3)
Chloride: 101 mmol/L (ref 98–111)
Creatinine, Ser: 1.14 mg/dL (ref 0.61–1.24)
GFR calc Af Amer: >60 mL/min (ref >60)
GFR calc non Af Amer: >60 mL/min (ref >60)
Glucose, Bld: 104 mg/dL — ABNORMAL HIGH (ref 70–99)
Potassium: 4 mmol/L (ref 3.5–5.1)
Sodium: 135 mmol/L (ref 135–145)

## 2018-05-25 MED ORDER — VALACYCLOVIR HCL 1 G PO TABS: 1000.0000 mg | ORAL_TABLET | Freq: Three times a day (TID) | ORAL | 0 refills | Status: DC

## 2018-05-25 MED ORDER — LORAZEPAM 2 MG/ML IJ SOLN
1.0000 mg | Freq: Once | INTRAMUSCULAR | Status: AC
  Administered 2018-05-25: 1 mg via INTRAVENOUS
  Filled 2018-05-25: qty 1

## 2018-05-25 MED ORDER — PREDNISONE 20 MG PO TABS: ORAL_TABLET | ORAL | 0 refills | Status: DC

## 2018-05-25 NOTE — ED Notes (Signed)
Patient verbalizes understanding of discharge instructions. Opportunity for questioning and answers were provided. Pt discharged from ED. 

## 2018-05-25 NOTE — Discharge Instructions (Signed)
Follow-up with your primary care provider.  Take the medications as prescribed.  You need to make sure that your eyes are well lubricated.  You can use artificial tears during the day and a thicker artificial tears with lubricant such as Soothe, at night.  Return to emergency department for any worsening symptoms.

## 2018-05-25 NOTE — ED Provider Notes (Signed)
[place] EMERGENCY DEPARTMENT Provider Note   CSN: [id number] Arrival date & time: 05/25/18  1526    History   Chief Complaint Chief Complaint  Patient presents with  . Headache  . Numbness    HPI Lee Krause is a 53 y.o. male.     Patient is a 53 year old male with a history of hypertension, hyperlipidemia and coronary artery disease who presents with facial drooping.  He states last night about 5:55 PM he noticed that he was having trouble chewing on the left side of his mouth.  He then noticed the left side of his face was drooping.  He is having trouble closing his left eye.  He has some itchy watery eyes bilaterally.  The symptoms have been fairly constant since they started last night.  He also has a headache to the left side of his head.  He has chronic neck pain but says is not any different than it normally is.  He denies any numbness to the face.  He denies any numbness or weakness to his extremities.  No vision changes.  No recent trauma.     Past Medical History:  Diagnosis Date  . Allergy   . Anemia   . Angina at rest [facility])   . Arthritis   . Chest pain   . Gastritis   . Gastrointestinal hemorrhage   . GERD (gastroesophageal reflux disease)   . Hyperlipidemia   . Hypertension   . Tobacco abuse     Patient Active Problem List   Diagnosis Date Noted  . CAD (coronary artery disease) 09/28/2017  . Hyperlipidemia 09/28/2017  . Atypical chest pain 04/05/2017  . Essential hypertension 04/05/2017  . Hematochezia   . Lower GI bleed 05/22/2016  . Family history of ischemic heart disease 11/18/2012  . Tobacco use disorder 11/18/2012    Past Surgical History:  Procedure Laterality Date  . INTRAVASCULAR PRESSURE WIRE/FFR STUDY N/A 09/24/2017   Procedure: INTRAVASCULAR PRESSURE WIRE/FFR STUDY;  Surgeon: Belva Crome, MD;  Location: [place] [facility];  Service: Cardiovascular;  Laterality: N/A;  . LEFT HEART CATH AND CORONARY  ANGIOGRAPHY N/A 09/24/2017   Procedure: LEFT HEART CATH AND CORONARY ANGIOGRAPHY;  Surgeon: Belva Crome, MD;  Location: [place] [facility];  Service: Cardiovascular;  Laterality: N/A;  . TOOTH EXTRACTION          Home Medications    Prior to Admission medications   Medication Sig Start Date End Date Taking? Authorizing Provider  acetaminophen (TYLENOL) 650 MG CR tablet Take 650 mg by mouth every 8 (eight) hours as needed for pain.    [provider]  aspirin EC 81 MG tablet Take 81 mg by mouth daily.    [provider]  atorvastatin (LIPITOR) 40 MG tablet Take 1 tablet (40 mg total) by mouth daily at 6 PM. 08/05/17   Wendie Agreste, MD  clopidogrel (PLAVIX) 75 MG tablet Take 1 tablet (75 mg total) by mouth daily. 09/28/17   Imogene Burn, PA-C  hydrochlorothiazide (MICROZIDE) 12.5 MG capsule Take 1 capsule (12.5 mg total) by mouth daily. 08/05/17   Wendie Agreste, MD  metoprolol succinate (TOPROL-XL) 25 MG 24 hr tablet Take 1 tablet by mouth daily. 09/27/17   [provider]  Multiple Vitamin (MULTIVITAMIN WITH MINERALS) TABS tablet Take 1 tablet by mouth daily.    [provider]  predniSONE (DELTASONE) 20 MG tablet Take 3 tablets once daily for 5 days, then 2  tablets once daily for 2 days, then 1 tablet once daily for 2 days 05/25/18   Malvin Johns, MD  valACYclovir (VALTREX) 1000 MG tablet Take 1 tablet (1,000 mg total) by mouth 3 (three) times daily. 05/25/18   Malvin Johns, MD    Family History Family History  Problem Relation Age of Onset  . Hypertension Father   . Heart attack Brother   . Colon cancer Neg Hx   . Esophageal cancer Neg Hx   . Rectal cancer Neg Hx   . Stomach cancer Neg Hx     Social History Social History   Tobacco Use  . Smoking status: Former Smoker    Packs/day: 0.25    Types: Cigarettes  . Smokeless tobacco: Never Used  . Tobacco comment: Quit 2 weeks ago.  Substance Use Topics  . Alcohol use: Yes     Alcohol/week: 6.0 standard drinks    Types: 6 Cans of beer per week  . Drug use: No     Allergies   Patient has no known allergies.   Review of Systems Review of Systems  Constitutional: Negative for chills, diaphoresis, fatigue and fever.  HENT: Negative for congestion, rhinorrhea and sneezing.   Eyes: Negative.   Respiratory: Negative for cough, chest tightness and shortness of breath.   Cardiovascular: Negative for chest pain and leg swelling.  Gastrointestinal: Negative for abdominal pain, blood in stool, diarrhea, nausea and vomiting.  Genitourinary: Negative for difficulty urinating, flank pain, frequency and hematuria.  Musculoskeletal: Negative for arthralgias and back pain.  Skin: Negative for rash.  Neurological: Positive for facial asymmetry, numbness and headaches. Negative for dizziness, speech difficulty and weakness.     Physical Exam Updated Vital Signs BP 131/90   Pulse 80   Temp 97.9 F (36.6 C) (Oral)   Resp 18   Ht 6\' 4"  (1.93 m)   Wt 104.3 kg   SpO2 99%   BMI 28.00 kg/m   Physical Exam Constitutional:      Appearance: He is well-developed.  HENT:     Head: Normocephalic and atraumatic.  Eyes:     Pupils: Pupils are equal, round, and reactive to light.  Neck:     Musculoskeletal: Normal range of motion and neck supple.  Cardiovascular:     Rate and Rhythm: Normal rate and regular rhythm.     Heart sounds: Normal heart sounds.  Pulmonary:     Effort: Pulmonary effort is normal. No respiratory distress.     Breath sounds: Normal breath sounds. No wheezing or rales.  Chest:     Chest wall: No tenderness.  Abdominal:     General: Bowel sounds are normal.     Palpations: Abdomen is soft.     Tenderness: There is no abdominal tenderness. There is no guarding or rebound.  Musculoskeletal: Normal range of motion.  Lymphadenopathy:     Cervical: No cervical adenopathy.  Skin:    General: Skin is warm and dry.     Findings: No rash.   Neurological:     Mental Status: He is alert and oriented to person, place, and time.     Comments: Patient has some drooping of the corner of the left side of the mouth.  He has weakness on closing his left eye.  He has diminished ability to lift his left eyebrow.  He has normal sensation to light touch in all nerve distributions of the face.  Motor 5 out of 5 in all extremities.  Sensation grossly intact  to light touch all extremities.  No visual field deficits.  Finger-nose intact.      ED Treatments / Results  Labs (all labs ordered are listed, but only abnormal results are displayed) Labs Reviewed  BASIC METABOLIC PANEL - Abnormal; Notable for the following components:      Result Value   Glucose, Bld 104 (*)    All other components within normal limits  CBC WITH DIFFERENTIAL/PLATELET    EKG None  Radiology Mr Brain Wo Contrast  Result Date: 05/25/2018 CLINICAL DATA:  Left facial droop, numbness, and headache. EXAM: MRI HEAD WITHOUT CONTRAST TECHNIQUE: Multiplanar, multiecho pulse sequences of the brain and surrounding structures were obtained without intravenous contrast. COMPARISON:  None. FINDINGS: Brain: There is no evidence of acute infarct, intracranial hemorrhage, mass, midline shift, or extra-axial fluid collection. The ventricles and sulci are normal in size. A cavum septum pellucidum et vergae is incidentally noted, a normal variant. The brain is normal in signal. Vascular: Major intracranial vascular flow voids are preserved. Skull and upper cervical spine: Unremarkable bone marrow signal. Sinuses/Orbits: Unremarkable orbits. Mild mucosal thickening in the paranasal sinuses. Clear mastoid air cells. Other: None. IMPRESSION: Unremarkable appearance of the brain. Electronically Signed   By: Logan Bores M.D.   On: 05/25/2018 17:19    Procedures Procedures (including critical care time)  Medications Ordered in ED Medications  LORazepam (ATIVAN) injection 1 mg (1 mg  Intravenous Given 05/25/18 1629)     Initial Impression / Assessment and Plan / ED Course  I have reviewed the triage vital signs and the nursing notes.  Pertinent labs & imaging results that were available during my care of the patient were reviewed by me and considered in my medical decision making (see chart for details).        Patient is a 53 year old male who presents with left-sided facial drooping.  His symptoms are consistent with Bell's palsy.  He has no pain over the temporal artery to suggest temporal arteritis.  He has no other neurologic deficits.  No visual field deficits.  He did have a left-sided headache over the left parietal area.  Given this, imaging was performed.  There is no evidence of stroke, mass, bleeding or other intracranial abnormality.  He has no reported hearing loss.  He was discharged home in good condition.  He was started on prednisone and Valtrex.  He was advised to keep his eye lubricated.  He was encouraged to have close follow-up with his PCP and was also given a referral to neurology.  Return precautions were given.  Final Clinical Impressions(s) / ED Diagnoses   Final diagnoses:  Bell's palsy    ED Discharge Orders         Ordered    valACYclovir (VALTREX) 1000 MG tablet  3 times daily     05/25/18 1805    predniSONE (DELTASONE) 20 MG tablet     05/25/18 1805           Malvin Johns, MD 05/25/18 1808

## 2018-05-25 NOTE — ED Notes (Signed)
Patient transported to MRI 

## 2018-05-25 NOTE — Telephone Encounter (Signed)
Pt calling with complaints of not being able to feel muscles in his face and feels like his face is drooping. Pt states he would not really describe the feeling as numb because he can still feel his face but pt states that his left eye is not blinking.  Pt reports that he has not experienced difficulty swallowing and feeling does not travel down the left side of his body just in his face. Pt advised that he would need call 911 and seek  further testing in the ED. Pt verbalized understanding. Pt's wife was in the home with the patient at the time of the call.   Reason for Disposition . Bell's palsy suspected (i.e., weakness on only one side of the face, developing over hours to days, no other symptoms)  Answer Assessment - Initial Assessment Questions 1. SYMPTOM: "What is the main symptom you are concerned about?" (e.g., weakness, numbness)     Pt states that he can not move the muscles in his face 2. ONSET: "When did this start?" (minutes, hours, days; while sleeping)     A couple of days ago 3. LAST NORMAL: "When was the last time you were normal (no symptoms)?"     About 2 days ago 4. PATTERN "Does this come and go, or has it been constant since it started?"  "Is it present now?"     Present now 5. CARDIAC SYMPTOMS: "Have you had any of the following symptoms: chest pain, difficulty breathing, palpitations?"     Not assessed 6. NEUROLOGIC SYMPTOMS: "Have you had any of the following symptoms: headache, dizziness, vision loss, double vision, changes in speech, unsteady on your feet?"     Headache and states he has missed a couple of steps at times 7. OTHER SYMPTOMS: "Do you have any other symptoms?"     Left eye is not blinking 8. PREGNANCY: "Is there any chance you are pregnant?" "When was your last menstrual period?"     n/a  Protocols used: NEUROLOGIC DEFICIT-A-AH

## 2018-05-25 NOTE — ED Triage Notes (Signed)
Pt from home via ems; yesterday at 1800, pt tried to eat dinner, unable to open mouth; woke 330 am, still unable to move mouth; numbness L side of face, worsening throughout day, also c/o HA L side; L side facial droop noted by ems; LVO negative with ems; takes Plavix for angina, but states he has not been compliant ; no Hx opf same  hypertensive at 167/103, states he took BP meds today  77 HR  18 RR  99% RA  CBG 140

## 2018-05-26 ENCOUNTER — Other Ambulatory Visit: Payer: Self-pay | Admitting: Family Medicine

## 2018-05-26 DIAGNOSIS — I10 Essential (primary) hypertension: Secondary | ICD-10-CM

## 2018-05-27 ENCOUNTER — Other Ambulatory Visit: Payer: Self-pay

## 2018-05-27 ENCOUNTER — Telehealth (INDEPENDENT_AMBULATORY_CARE_PROVIDER_SITE_OTHER): Payer: 59 | Admitting: Family Medicine

## 2018-05-27 DIAGNOSIS — G510 Bell's palsy: Secondary | ICD-10-CM | POA: Diagnosis not present

## 2018-05-27 NOTE — Progress Notes (Signed)
Virtual Visit via Video Note  I connected with Lee Krause on 05/27/18 at 2:10 PM by a video enabled telemedicine application and verified that I am speaking with the correct person using two identifiers. Changed to audio   I discussed the limitations, risks, security and privacy concerns of performing an evaluation and management service by telephone and the availability of in person appointments. I also discussed with the patient that there may be a patient responsible charge related to this service. The patient expressed understanding and agreed to proceed, consent obtained  Chief complaint:  Hospital follow-up, Bell's palsy   History of Present Illness: Lee Krause is a 53 y.o. male  Bell's palsy: Evaluated May 13 through [facility] emergency room.  Trouble chewing on the left side of his mouth night prior at 5:55 PM.  Face was drooping and trouble closing his left eye, some itchy and watery eyes bilaterally at that time.  Persistent symptoms.  Left-sided headache.  No numbness.  No numbness or weakness of extremities or vision changes. Glucose 104 on BMP but otherwise normal, and normal CBC.  MRI brain was unremarkable appearance. Treated with prednisone taper (60 mg for 5 days, 40 mg x 2 days, 20 mg x 2 days) and Valtrex 1 g 3 times daily.  Just took 1st dose of meds this am. No new side effects at this time. Still feels similar sx's - left face only. Some eye watering. Using allergy eye drops - plans on moisturizing eye drops.  No other weakness. No headache. Feels flooded feeling in left ear.  Ok to hear ok. Eating and drinking ok. Slight change in taste.  No vision changes, no eye pain. Eye is watering.    Patient Active Problem List   Diagnosis Date Noted  . CAD (coronary artery disease) 09/28/2017  . Hyperlipidemia 09/28/2017  . Atypical chest pain 04/05/2017  . Essential hypertension 04/05/2017  . Hematochezia   . Lower GI bleed 05/22/2016  . Family history of  ischemic heart disease 11/18/2012  . Tobacco use disorder 11/18/2012   Past Medical History:  Diagnosis Date  . Allergy   . Anemia   . Angina at rest [facility])   . Arthritis   . Chest pain   . Gastritis   . Gastrointestinal hemorrhage   . GERD (gastroesophageal reflux disease)   . Hyperlipidemia   . Hypertension   . Tobacco abuse    Past Surgical History:  Procedure Laterality Date  . INTRAVASCULAR PRESSURE WIRE/FFR STUDY N/A 09/24/2017   Procedure: INTRAVASCULAR PRESSURE WIRE/FFR STUDY;  Surgeon: Belva Crome, MD;  Location: [place] [facility];  Service: Cardiovascular;  Laterality: N/A;  . LEFT HEART CATH AND CORONARY ANGIOGRAPHY N/A 09/24/2017   Procedure: LEFT HEART CATH AND CORONARY ANGIOGRAPHY;  Surgeon: Belva Crome, MD;  Location: [place] [facility];  Service: Cardiovascular;  Laterality: N/A;  . TOOTH EXTRACTION     No Known Allergies Prior to Admission medications   Medication Sig Start Date End Date Taking? Authorizing Provider  acetaminophen (TYLENOL) 650 MG CR tablet Take 650 mg by mouth every 8 (eight) hours as needed for pain.   Yes [provider]  aspirin EC 81 MG tablet Take 81 mg by mouth daily.   Yes [provider]  atorvastatin (LIPITOR) 40 MG tablet Take 1 tablet (40 mg total) by mouth daily at 6 PM. 08/05/17  Yes Wendie Agreste, MD  clopidogrel (PLAVIX) 75 MG tablet Take 1 tablet (75 mg  total) by mouth daily. 09/28/17  Yes Imogene Burn, PA-C  hydrochlorothiazide (MICROZIDE) 12.5 MG capsule Take 1 capsule by mouth once daily 05/26/18  Yes Wendie Agreste, MD  metoprolol succinate (TOPROL-XL) 25 MG 24 hr tablet Take 1 tablet by mouth daily. 09/27/17  Yes [provider]  Multiple Vitamin (MULTIVITAMIN WITH MINERALS) TABS tablet Take 1 tablet by mouth daily.   Yes [provider]  predniSONE (DELTASONE) 20 MG tablet Take 3 tablets once daily for 5 days, then 2 tablets once daily for 2 days, then 1 tablet once daily for 2  days 05/25/18  Yes Malvin Johns, MD  valACYclovir (VALTREX) 1000 MG tablet Take 1 tablet (1,000 mg total) by mouth 3 (three) times daily. 05/25/18  Yes Malvin Johns, MD   Social History   Socioeconomic History  . Marital status: Married    Spouse name: Not on file  . Number of children: Not on file  . Years of education: Not on file  . Highest education level: Not on file  Occupational History  . Not on file  Social Needs  . Financial resource strain: Not on file  . Food insecurity:    Worry: Not on file    Inability: Not on file  . Transportation needs:    Medical: Not on file    Non-medical: Not on file  Tobacco Use  . Smoking status: Former Smoker    Packs/day: 0.25    Types: Cigarettes  . Smokeless tobacco: Never Used  . Tobacco comment: Quit 2 weeks ago.  Substance and Sexual Activity  . Alcohol use: Yes    Alcohol/week: 6.0 standard drinks    Types: 6 Cans of beer per week  . Drug use: No  . Sexual activity: Yes  Lifestyle  . Physical activity:    Days per week: Not on file    Minutes per session: Not on file  . Stress: Not on file  Relationships  . Social connections:    Talks on phone: Not on file    Gets together: Not on file    Attends religious service: Not on file    Active member of club or organization: Not on file    Attends meetings of clubs or organizations: Not on file    Relationship status: Not on file  . Intimate partner violence:    Fear of current or ex partner: Not on file    Emotionally abused: Not on file    Physically abused: Not on file    Forced sexual activity: Not on file  Other Topics Concern  . Not on file  Social History Narrative  . Not on file    Observations/Objective: Decrease in forehead mvmt, able to occlude eye. weak on left with smile but some movement. No air leakage with puff.  Minimal difference at rest.  House Brackman grade III on exam.   Assessment and Plan: Bell's palsy House-Brackman scale 3 based on  evaluation above.  Is currently both on prednisone and Valtrex.  Ophthalmologic care discussed with eyedrops throughout the day, ointment if needed at night to prevent drying.  If pain or blurry vision should have ophthalmology evaluation.  Recheck next few weeks with RTC/ER precautions if worse or other focal weakness.   Follow Up Instructions:   3 to 4 weeks   Patient Instructions  Your symptoms do appear consistent with Bell's palsy.  I did review notes from the ER. Continue prednisone and Valacyclovir as prescribed by the emergency room.  Artificial tears throughout the day to keep the eye from drying out, and ointment such as Lacri-Lube at bedtime may be more helpful. See information below.  Follow-up with me in the next 3 to 4 weeks, sooner if any worsening or new symptoms.   Return to the clinic or go to the nearest emergency room if any of your symptoms worsen or new symptoms occur.   Bell Palsy, Adult  Bell palsy is a short-term inability to move muscles in part of the face. The inability to move (paralysis) results from inflammation or compression of the facial nerve, which travels along the skull and under the ear to the side of the face (7th cranial nerve). This nerve is responsible for facial movements that include blinking, closing the eyes, smiling, and frowning. What are the causes? The exact cause of this condition is not known. It may be caused by an infection from a virus, such as the chickenpox (herpes zoster), Epstein-Barr, or mumps virus. What increases the risk? You are more likely to develop this condition if:  You are pregnant.  You have diabetes.  You have had a recent infection in your nose, throat, or airways (upper respiratory infection).  You have a weakened body defense system (immune system).  You have had a facial injury, such as a fracture.  You have a family history of Bell palsy. What are the signs or symptoms? Symptoms of this condition  include:  Weakness on one side of the face.  Drooping eyelid and corner of the mouth.  Excessive tearing in one eye.  Difficulty closing the eyelid.  Dry eye.  Drooling.  Dry mouth.  Changes in taste.  Change in facial appearance.  Pain behind one ear.  Ringing in one or both ears.  Sensitivity to sound in one ear.  Facial twitching.  Headache.  Impaired speech.  Dizziness.  Difficulty eating or drinking. Most of the time, only one side of the face is affected. Rarely, Bell palsy affects the whole face. How is this diagnosed? This condition is diagnosed based on:  Your symptoms.  Your medical history.  A physical exam. You may also have to see health care providers who specialize in disorders of the nerves (neurologist) or diseases and conditions of the eye (ophthalmologist). You may have tests, such as:  A test to check for nerve damage (electromyogram).  Imaging studies, such as CT or MRI scans.  Blood tests. How is this treated? This condition affects every person differently. Sometimes symptoms go away without treatment within a couple weeks. If treatment is needed, it varies from person to person. The goal of treatment is to reduce inflammation and protect the eye from damage. Treatment for Bell palsy may include:  Medicines, such as: ? Steroids to reduce swelling and inflammation. ? Antiviral drugs. ? Pain relievers, including aspirin, acetaminophen, or ibuprofen.  Eye drops or ointment to keep your eye moist.  Eye protection, if you cannot close your eye.  Exercises or massage to regain muscle strength and function (physical therapy). Follow these instructions at home:   Take over-the-counter and prescription medicines only as told by your health care provider.  If your eye is affected: ? Keep your eye moist with eye drops or ointment as told by your health care provider. ? Follow instructions for eye care and protection as told by your  health care provider.  Do any physical therapy exercises as told by your health care provider.  Keep all follow-up visits as told by  your health care provider. This is important. Contact a health care provider if:  You have a fever.  Your symptoms do not get better within 2-3 weeks, or your symptoms get worse.  Your eye is red, irritated, or painful.  You have new symptoms. Get help right away if:  You have weakness or numbness in a part of your body other than your face.  You have trouble swallowing.  You develop neck pain or stiffness.  You develop dizziness or shortness of breath. Summary  Bell palsy is a short-term inability to move muscles in part of the face. The inability to move (paralysis) results from inflammation or compression of the facial nerve.  This condition affects every person differently. Sometimes symptoms go away without treatment within a couple weeks.  If treatment is needed, it varies from person to person. The goal of treatment is to reduce inflammation and protect the eye from damage.  Contact your health care provider if your symptoms do not get better within 2-3 weeks, or your symptoms get worse. This information is not intended to replace advice given to you by your health care provider. Make sure you discuss any questions you have with your health care provider. Document Released: 12/29/2004 Document Revised: 11/27/2016 Document Reviewed: 03/03/2016 Elsevier Interactive Patient Education  2019 [employer].     I discussed the assessment and treatment plan with the patient. The patient was provided an opportunity to ask questions and all were answered. The patient agreed with the plan and demonstrated an understanding of the instructions.   The patient was advised to call back or seek an in-person evaluation if the symptoms worsen or if the condition fails to improve as anticipated.  I provided 12 minutes of non-face-to-face time during this  encounter.   Wendie Agreste, MD

## 2018-05-27 NOTE — Progress Notes (Signed)
Hos F/u and left side muscle is feeling weird and from possible stroke like sx. Still feeling the sx of left side of face fatigued.

## 2018-05-27 NOTE — Patient Instructions (Addendum)
Your symptoms do appear consistent with Bell's palsy.  I did review notes from the ER. Continue prednisone and Valacyclovir as prescribed by the emergency room. Artificial tears throughout the day to keep the eye from drying out, and ointment such as Lacri-Lube at bedtime may be more helpful. See information below.  Follow-up with me in the next 3 to 4 weeks, sooner if any worsening or new symptoms.   Return to the clinic or go to the nearest emergency room if any of your symptoms worsen or new symptoms occur.   Bell Palsy, Adult  Bell palsy is a short-term inability to move muscles in part of the face. The inability to move (paralysis) results from inflammation or compression of the facial nerve, which travels along the skull and under the ear to the side of the face (7th cranial nerve). This nerve is responsible for facial movements that include blinking, closing the eyes, smiling, and frowning. What are the causes? The exact cause of this condition is not known. It may be caused by an infection from a virus, such as the chickenpox (herpes zoster), Epstein-Barr, or mumps virus. What increases the risk? You are more likely to develop this condition if:  You are pregnant.  You have diabetes.  You have had a recent infection in your nose, throat, or airways (upper respiratory infection).  You have a weakened body defense system (immune system).  You have had a facial injury, such as a fracture.  You have a family history of Bell palsy. What are the signs or symptoms? Symptoms of this condition include:  Weakness on one side of the face.  Drooping eyelid and corner of the mouth.  Excessive tearing in one eye.  Difficulty closing the eyelid.  Dry eye.  Drooling.  Dry mouth.  Changes in taste.  Change in facial appearance.  Pain behind one ear.  Ringing in one or both ears.  Sensitivity to sound in one ear.  Facial twitching.  Headache.  Impaired  speech.  Dizziness.  Difficulty eating or drinking. Most of the time, only one side of the face is affected. Rarely, Bell palsy affects the whole face. How is this diagnosed? This condition is diagnosed based on:  Your symptoms.  Your medical history.  A physical exam. You may also have to see health care providers who specialize in disorders of the nerves (neurologist) or diseases and conditions of the eye (ophthalmologist). You may have tests, such as:  A test to check for nerve damage (electromyogram).  Imaging studies, such as CT or MRI scans.  Blood tests. How is this treated? This condition affects every person differently. Sometimes symptoms go away without treatment within a couple weeks. If treatment is needed, it varies from person to person. The goal of treatment is to reduce inflammation and protect the eye from damage. Treatment for Bell palsy may include:  Medicines, such as: ? Steroids to reduce swelling and inflammation. ? Antiviral drugs. ? Pain relievers, including aspirin, acetaminophen, or ibuprofen.  Eye drops or ointment to keep your eye moist.  Eye protection, if you cannot close your eye.  Exercises or massage to regain muscle strength and function (physical therapy). Follow these instructions at home:   Take over-the-counter and prescription medicines only as told by your health care provider.  If your eye is affected: ? Keep your eye moist with eye drops or ointment as told by your health care provider. ? Follow instructions for eye care and protection as told by  your health care provider.  Do any physical therapy exercises as told by your health care provider.  Keep all follow-up visits as told by your health care provider. This is important. Contact a health care provider if:  You have a fever.  Your symptoms do not get better within 2-3 weeks, or your symptoms get worse.  Your eye is red, irritated, or painful.  You have new  symptoms. Get help right away if:  You have weakness or numbness in a part of your body other than your face.  You have trouble swallowing.  You develop neck pain or stiffness.  You develop dizziness or shortness of breath. Summary  Bell palsy is a short-term inability to move muscles in part of the face. The inability to move (paralysis) results from inflammation or compression of the facial nerve.  This condition affects every person differently. Sometimes symptoms go away without treatment within a couple weeks.  If treatment is needed, it varies from person to person. The goal of treatment is to reduce inflammation and protect the eye from damage.  Contact your health care provider if your symptoms do not get better within 2-3 weeks, or your symptoms get worse. This information is not intended to replace advice given to you by your health care provider. Make sure you discuss any questions you have with your health care provider. Document Released: 12/29/2004 Document Revised: 11/27/2016 Document Reviewed: 03/03/2016 Elsevier Interactive Patient Education  2019 [employer].

## 2018-05-30 ENCOUNTER — Telehealth (INDEPENDENT_AMBULATORY_CARE_PROVIDER_SITE_OTHER): Payer: 59 | Admitting: Family Medicine

## 2018-05-30 ENCOUNTER — Other Ambulatory Visit: Payer: Self-pay

## 2018-05-30 ENCOUNTER — Ambulatory Visit: Payer: Self-pay | Admitting: Family Medicine

## 2018-05-30 DIAGNOSIS — Z7189 Other specified counseling: Secondary | ICD-10-CM | POA: Diagnosis not present

## 2018-05-30 DIAGNOSIS — E785 Hyperlipidemia, unspecified: Secondary | ICD-10-CM

## 2018-05-30 MED ORDER — ATORVASTATIN CALCIUM 40 MG PO TABS: 40.0000 mg | ORAL_TABLET | Freq: Every day | ORAL | 1 refills | Status: DC

## 2018-05-30 NOTE — Telephone Encounter (Signed)
Pt. Reports someone at work has tested positive for COVID 19. Concerned because of his recent diagnosis of Bell's Palsy, wife has asthma and child has sickle cell. Warm transfer to [facility] in the practice for visit.  Answer Assessment - Initial Assessment Questions 1. CLOSE CONTACT: "Who is the person with the confirmed or suspected COVID-19 infection that you were exposed to?"     Exposed by a co-worker 2. PLACE of CONTACT: "Where were you when you were exposed to COVID-19?" (e.g., home, school, medical waiting room; which city?)     At work 3. TYPE of CONTACT: "How much contact was there?" (e.g., sitting next to, live in same house, work in same office, same building)     Same building 4. DURATION of CONTACT: "How long were you in contact with the COVID-19 patient?" (e.g., a few seconds, passed by person, a few minutes, live with the patient)     Unsure 5. DATE of CONTACT: "When did you have contact with a COVID-19 patient?" (e.g., how many days ago)     This month 6. TRAVEL: "Have you traveled out of the country recently?" If so, "When and where?"     * Also ask about out-of-state travel, since the CDC has identified some high risk cities for community spread in the [place].     * Note: Travel becomes less relevant if there is widespread community transmission where the patient lives.     No 7. COMMUNITY SPREAD: "Are there lots of cases of COVID-19 (community spread) where you live?" (See public health department website, if unsure)   * MAJOR community spread: high number of cases; numbers of cases are increasing; many people hospitalized.   * MINOR community spread: low number of cases; not increasing; few or no people hospitalized     Yes 8. SYMPTOMS: "Do you have any symptoms?" (e.g., fever, cough, breathing difficulty)     No 9. PREGNANCY OR POSTPARTUM: "Is there any chance you are pregnant?" "When was your last menstrual period?" "Did you deliver in the last 2 weeks?"     n/a 10. HIGH  RISK: "Do you have any heart or lung problems? Do you have a weak immune system?" (e.g., CHF, COPD, asthma, HIV positive, chemotherapy, renal failure, diabetes mellitus, sickle cell anemia)       HTN, Bell's Palsy  Protocols used: CORONAVIRUS (COVID-19) EXPOSURE-A-AH

## 2018-05-30 NOTE — Patient Instructions (Addendum)
I refilled cholesterol med same dose for now, but recheck levels in next 3 months. If LDL is still over 70, may need to adjust medicines.   It would be unlikely that Covid 19 was the cause of Bell's palsy symptoms, especially without any other symptoms.  Additionally as it had been 11 days since possible exposure to that coworker, you are almost at 2 weeks time when we would typically recommend as a time to watch for exposure.  Again I feel like it is low risk at this time.  Please let me know if you have any new symptoms, or further questions.  If you have lab work done today you will be contacted with your lab results within the next 2 weeks.  If you have not heard from [place] then please contact us. The fastest way to get your results is to register for My Chart.   IF you received an x-ray today, you will receive an invoice from [facility]. Please contact [facility] at [phone] with questions or concerns regarding your invoice.   IF you received labwork today, you will receive an invoice from [facility]. Please contact [facility] at [phone] with questions or concerns regarding your invoice.   Our billing staff will not be able to assist you with questions regarding bills from these companies.  You will be contacted with the lab results as soon as they are available. The fastest way to get your results is to activate your My Chart account. Instructions are located on the last page of this paperwork. If you have not heard from [place] regarding the results in 2 weeks, please contact this office.

## 2018-05-30 NOTE — Progress Notes (Signed)
CC: pt exposed to covid-19 positive coworker.  Per pt he also needs a refill on his atorvastatin.  No recent weight or bp.  No travel outside the [place] on [place] in the past 3 weeks.

## 2018-05-30 NOTE — Progress Notes (Signed)
Virtual Visit via Telephone Note  I connected with [name] on 05/30/18 at 3:26 PM by telephone and verified that I am speaking with the correct person using two identifiers.   I discussed the limitations, risks, security and privacy concerns of performing an evaluation and management service by telephone and the availability of in person appointments. I also discussed with the patient that there may be a patient responsible charge related to this service. The patient expressed understanding and agreed to proceed, consent obtained  Chief complaint: Exposure to covid 19  History of Present Illness: Lee Krause is a 53 y.o. male   Possible exposure to Covid-34.  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 Not sure if she had symptoms - possibly was on vacation. At the time. She does not work in close proximity, but she touches parts that may have come through his area. He received a call from HR on Friday that there was someone that had tested positive - asked if he was coming back to work on Monday.  No personal cough, fever, dyspnea, loss of taste or smell. No new body aches or headaches.  He feels fine.  Plans to return to work tomorrow.   Hyperlipidemia: With history of CAD, cardiology [facility], Dr. Irish Lack.  On Lipitor 40 mg daily, Plavix 75 mg daily, overdue for testing.  It appears he had future labs ordered at cardiology in January but has not had done.  Lab Results  Component Value Date   CHOL 233 (H) 08/05/2017   HDL 57 08/05/2017   LDLCALC 119 (H) 08/05/2017   LDLDIRECT 130.6 11/25/2012   TRIG 285 (H) 08/05/2017   CHOLHDL 4.1 08/05/2017   Lab Results  Component Value Date   ALT 17 08/05/2017   AST 21 08/05/2017   ALKPHOS 58 08/05/2017   BILITOT 0.4 08/05/2017  had health screening with cholesterol earlier this year 03/08/18 on Care Everywhere:  TC 190, trig 225, LDL 82. Was fasting for that testing.    Patient Active Problem List   Diagnosis Date Noted  . CAD (coronary artery disease) 09/28/2017  . Hyperlipidemia 09/28/2017  . Atypical chest pain 04/05/2017  . Essential hypertension 04/05/2017  . Hematochezia   . Lower GI bleed 05/22/2016  . Family history of ischemic heart disease 11/18/2012  . Tobacco use disorder 11/18/2012   Past Medical History:  Diagnosis Date  . Allergy   . Anemia   . Angina at rest [facility])   . Arthritis   . Chest pain   . Gastritis   . Gastrointestinal hemorrhage   . GERD (gastroesophageal reflux disease)   . Hyperlipidemia   . Hypertension   . Tobacco abuse    Past Surgical History:  Procedure Laterality Date  . INTRAVASCULAR PRESSURE WIRE/FFR STUDY N/A 09/24/2017   Procedure: INTRAVASCULAR PRESSURE WIRE/FFR STUDY;  Surgeon: Belva Crome, MD;  Location: [place] [facility];  Service: Cardiovascular;  Laterality: N/A;  . LEFT HEART CATH AND CORONARY ANGIOGRAPHY N/A 09/24/2017   Procedure: LEFT HEART CATH AND CORONARY ANGIOGRAPHY;  Surgeon: Belva Crome, MD;  Location: [place] [facility];  Service: Cardiovascular;  Laterality: N/A;  . TOOTH EXTRACTION     No Known Allergies Prior to Admission medications   Medication Sig Start Date End Date Taking? Authorizing Provider  acetaminophen (TYLENOL) 650 MG CR tablet Take 650 mg  by mouth every 8 (eight) hours as needed for pain.   Yes [provider]  aspirin EC 81 MG tablet Take 81 mg by mouth daily.   Yes [provider]  atorvastatin (LIPITOR) 40 MG tablet Take 1 tablet (40 mg total) by mouth daily at 6 PM. 08/05/17  Yes Wendie Agreste, MD  clopidogrel (PLAVIX) 75 MG tablet Take 1 tablet (75 mg total) by mouth daily. 09/28/17  Yes Imogene Burn, PA-C  hydrochlorothiazide (MICROZIDE) 12.5 MG capsule Take 1 capsule by mouth once daily 05/26/18  Yes Wendie Agreste, MD  metoprolol succinate (TOPROL-XL) 25 MG 24 hr tablet  Take 1 tablet by mouth daily. 09/27/17  Yes [provider]  Multiple Vitamin (MULTIVITAMIN WITH MINERALS) TABS tablet Take 1 tablet by mouth daily.   Yes [provider]  predniSONE (DELTASONE) 20 MG tablet Take 3 tablets once daily for 5 days, then 2 tablets once daily for 2 days, then 1 tablet once daily for 2 days 05/25/18  Yes Malvin Johns, MD  valACYclovir (VALTREX) 1000 MG tablet Take 1 tablet (1,000 mg total) by mouth 3 (three) times daily. 05/25/18  Yes Malvin Johns, MD   Social History   Socioeconomic History  . Marital status: Married    Spouse name: Not on file  . Number of children: Not on file  . Years of education: Not on file  . Highest education level: Not on file  Occupational History  . Not on file  Social Needs  . Financial resource strain: Not on file  . Food insecurity:    Worry: Not on file    Inability: Not on file  . Transportation needs:    Medical: Not on file    Non-medical: Not on file  Tobacco Use  . Smoking status: Former Smoker    Packs/day: 0.25    Types: Cigarettes  . Smokeless tobacco: Never Used  . Tobacco comment: Quit 2 weeks ago.  Substance and Sexual Activity  . Alcohol use: Yes    Alcohol/week: 6.0 standard drinks    Types: 6 Cans of beer per week  . Drug use: No  . Sexual activity: Yes  Lifestyle  . Physical activity:    Days per week: Not on file    Minutes per session: Not on file  . Stress: Not on file  Relationships  . Social connections:    Talks on phone: Not on file    Gets together: Not on file    Attends religious service: Not on file    Active member of club or organization: Not on file    Attends meetings of clubs or organizations: Not on file    Relationship status: Not on file  . Intimate partner violence:    Fear of current or ex partner: Not on file    Emotionally abused: Not on file    Physically abused: Not on file    Forced sexual activity: Not on file  Other Topics Concern  . Not on  file  Social History Narrative  . Not on file     Observations/Objective: No distress, speaking in normal sentences.   Assessment and Plan: Advice Given About Covid-19 Virus by Telephone  -Discussed potential exposure to COVID-19 at work, but did not have direct exposure to that person.  Has been 11 days since that person was apparently at work, and does not have personal symptoms.  Unlikely exposure, 3 additional days of monitoring as option.  Recommend he discuss  with HR if further questions regarding returning to work  Hyperlipidemia, unspecified hyperlipidemia type - Plan: atorvastatin (LIPITOR) 40 MG tablet, DISCONTINUED: atorvastatin (LIPITOR) 40 MG tablet  -Lipitor refilled at same dose, plan for recheck in 3 months and if still over 70 may need to adjust medications.  Follow Up Instructions: Patient Instructions   I refilled cholesterol med same dose for now, but recheck levels in next 3 months. If LDL is still over 70, may need to adjust medicines.   It would be unlikely that Covid 19 was the cause of Bell's palsy symptoms, especially without any other symptoms.  Additionally as it had been 11 days since possible exposure to that coworker, you are almost at 2 weeks time when we would typically recommend as a time to watch for exposure.  Again I feel like it is low risk at this time.  Please let me know if you have any new symptoms, or further questions.  If you have lab work done today you will be contacted with your lab results within the next 2 weeks.  If you have not heard from [place] then please contact us. The fastest way to get your results is to register for My Chart.   IF you received an x-ray today, you will receive an invoice from [facility]. Please contact [facility] at [phone] with questions or concerns regarding your invoice.   IF you received labwork today, you will receive an invoice from [facility]. Please contact [facility] at [phone] with  questions or concerns regarding your invoice.   Our billing staff will not be able to assist you with questions regarding bills from these companies.  You will be contacted with the lab results as soon as they are available. The fastest way to get your results is to activate your My Chart account. Instructions are located on the last page of this paperwork. If you have not heard from [place] regarding the results in 2 weeks, please contact this office.         I discussed the assessment and treatment plan with the patient. The patient was provided an opportunity to ask questions and all were answered. The patient agreed with the plan and demonstrated an understanding of the instructions.   The patient was advised to call back or seek an in-person evaluation if the symptoms worsen or if the condition fails to improve as anticipated.  I provided 21 minutes of non-face-to-face time during this encounter.  Signed,   Merri Ray, MD [facility] at [place].  05/30/18

## 2018-05-30 NOTE — Telephone Encounter (Signed)
Pt being evaluated today in the office.

## 2018-06-28 ENCOUNTER — Ambulatory Visit: Payer: Managed Care, Other (non HMO) | Admitting: Family Medicine

## 2018-06-30 ENCOUNTER — Encounter: Payer: Self-pay | Admitting: Family Medicine

## 2018-08-01 ENCOUNTER — Telehealth (INDEPENDENT_AMBULATORY_CARE_PROVIDER_SITE_OTHER): Payer: 59 | Admitting: Family Medicine

## 2018-08-01 VITALS — BP 120/76 | Temp 98.1°F | Ht 76.0 in | Wt 240.0 lb

## 2018-08-01 DIAGNOSIS — Z20822 Contact with and (suspected) exposure to covid-19: Secondary | ICD-10-CM

## 2018-08-01 DIAGNOSIS — Z20828 Contact with and (suspected) exposure to other viral communicable diseases: Secondary | ICD-10-CM

## 2018-08-01 NOTE — Progress Notes (Signed)
Virtual Visit via Telephone Note  I connected with Lee Krause on 08/01/18 at 12:52 PM by telephone and verified that I am speaking with the correct person using two identifiers.   I discussed the limitations, risks, security and privacy concerns of performing an evaluation and management service by telephone and the availability of in person appointments. I also discussed with the patient that there may be a patient responsible charge related to this service. The patient expressed understanding and agreed to proceed, consent obtained  Chief complaint:  covid 19 testing.   History of Present Illness: Lee Krause is a 53 y.o. male  Telemedicine visit made, concern for possible exposure to care at that time but asymptomatic.  Decided against testing at that time.  Today has concerns regarding possible exposure to COVID and would like to have testing performed.  Has had some people at work that tested positive.  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  Daughter went to beach recently, no symptoms.  No known direct contact with  Wife has asthma, chronic bronchitis- concerned about bringing home infection to home. Call out  Of work to get tested - left Thursday am. Not back to work.   No fever, no cough/dyspnea.  No change in taste/smell.  No body aches/fatigue.  No diarrhea/GI symptoms.    Patient Active Problem List   Diagnosis Date Noted  . CAD (coronary artery disease) 09/28/2017  . Hyperlipidemia 09/28/2017  . Atypical chest pain 04/05/2017  . Essential hypertension 04/05/2017  . Hematochezia   . Lower GI bleed 05/22/2016  . Family history of ischemic heart disease 11/18/2012  . Tobacco use disorder 11/18/2012   Past Medical History:  Diagnosis Date  . Allergy   . Anemia   . Angina at rest [facility])   . Arthritis   . Chest pain   . Gastritis   . Gastrointestinal hemorrhage   . GERD  (gastroesophageal reflux disease)   . Hyperlipidemia   . Hypertension   . Tobacco abuse    Past Surgical History:  Procedure Laterality Date  . INTRAVASCULAR PRESSURE WIRE/FFR STUDY N/A 09/24/2017   Procedure: INTRAVASCULAR PRESSURE WIRE/FFR STUDY;  Surgeon: Belva Crome, MD;  Location: [place] [facility];  Service: Cardiovascular;  Laterality: N/A;  . LEFT HEART CATH AND CORONARY ANGIOGRAPHY N/A 09/24/2017   Procedure: LEFT HEART CATH AND CORONARY ANGIOGRAPHY;  Surgeon: Belva Crome, MD;  Location: [place] [facility];  Service: Cardiovascular;  Laterality: N/A;  . TOOTH EXTRACTION     No Known Allergies Prior to Admission medications   Medication Sig Start Date End Date Taking? Authorizing Provider  acetaminophen (TYLENOL) 650 MG CR tablet Take 650 mg by mouth every 8 (eight) hours as needed for pain.    [provider]  aspirin EC 81 MG tablet Take 81 mg by mouth daily.    [provider]  atorvastatin (LIPITOR) 40 MG tablet Take 1 tablet (40 mg total) by mouth daily at 6 PM. 05/30/18   Wendie Agreste, MD  clopidogrel (PLAVIX) 75 MG tablet Take 1 tablet (75 mg total) by mouth daily. 09/28/17   Imogene Burn, PA-C  hydrochlorothiazide (MICROZIDE) 12.5 MG capsule Take 1 capsule by mouth once daily 05/26/18   Wendie Agreste, MD  metoprolol succinate (TOPROL-XL) 25 MG 24 hr tablet Take 1 tablet by mouth daily. 09/27/17   [provider]  Multiple Vitamin (  MULTIVITAMIN WITH MINERALS) TABS tablet Take 1 tablet by mouth daily.    [provider]  predniSONE (DELTASONE) 20 MG tablet Take 3 tablets once daily for 5 days, then 2 tablets once daily for 2 days, then 1 tablet once daily for 2 days 05/25/18   Malvin Johns, MD  valACYclovir (VALTREX) 1000 MG tablet Take 1 tablet (1,000 mg total) by mouth 3 (three) times daily. 05/25/18   Malvin Johns, MD   Social History   Socioeconomic History  . Marital status: Married    Spouse name: Not on file  .  Number of children: Not on file  . Years of education: Not on file  . Highest education level: Not on file  Occupational History  . Not on file  Social Needs  . Financial resource strain: Not on file  . Food insecurity    Worry: Not on file    Inability: Not on file  . Transportation needs    Medical: Not on file    Non-medical: Not on file  Tobacco Use  . Smoking status: Former Smoker    Packs/day: 0.25    Types: Cigarettes  . Smokeless tobacco: Never Used  . Tobacco comment: Quit 2 weeks ago.  Substance and Sexual Activity  . Alcohol use: Yes    Alcohol/week: 6.0 standard drinks    Types: 6 Cans of beer per week  . Drug use: No  . Sexual activity: Yes  Lifestyle  . Physical activity    Days per week: Not on file    Minutes per session: Not on file  . Stress: Not on file  Relationships  . Social Herbalist on phone: Not on file    Gets together: Not on file    Attends religious service: Not on file    Active member of club or organization: Not on file    Attends meetings of clubs or organizations: Not on file    Relationship status: Not on file  . Intimate partner violence    Fear of current or ex partner: Not on file    Emotionally abused: Not on file    Physically abused: Not on file    Forced sexual activity: Not on file  Other Topics Concern  . Not on file  Social History Narrative  . Not on file     Observations/Objective:   Assessment and Plan: Close Exposure to Covid-19 Virus - Plan: Novel Coronavirus, NAA ([facility]),   -No symptoms but multiple positives at his workplace, concern regarding family members at home that may be a high risk for infection.  Outpatient testing ordered, continue prevention techniques.    Follow Up Instructions: As needed.    Patient Instructions  Test results will be available in 5-7 days. Test can be done at [place] (former [facility]) - drive up testing. Let me know if there are questions.      I discussed the assessment and treatment plan with the patient. The patient was provided an opportunity to ask questions and all were answered. The patient agreed with the plan and demonstrated an understanding of the instructions.   The patient was advised to call back or seek an in-person evaluation if the symptoms worsen or if the condition fails to improve as anticipated.  I provided 10 minutes of non-face-to-face time during this encounter.  Signed,   Merri Ray, MD Primary [facility] at [place].  08/01/18

## 2018-08-01 NOTE — Patient Instructions (Addendum)
Test results will be available in 5-7 days. Test can be done at [place] (former [facility]) - drive up testing. Let me know if there are questions.

## 2018-08-02 ENCOUNTER — Other Ambulatory Visit: Payer: Self-pay

## 2018-08-02 DIAGNOSIS — Z20822 Contact with and (suspected) exposure to covid-19: Secondary | ICD-10-CM

## 2018-08-04 LAB — NOVEL CORONAVIRUS, NAA: SARS-CoV-2, NAA: NOT DETECTED

## 2018-09-10 ENCOUNTER — Other Ambulatory Visit: Payer: Self-pay | Admitting: Physician Assistant

## 2018-09-26 ENCOUNTER — Telehealth (INDEPENDENT_AMBULATORY_CARE_PROVIDER_SITE_OTHER): Payer: 59 | Admitting: Family Medicine

## 2018-09-26 ENCOUNTER — Encounter: Payer: Self-pay | Admitting: Family Medicine

## 2018-09-26 DIAGNOSIS — M79672 Pain in left foot: Secondary | ICD-10-CM | POA: Diagnosis not present

## 2018-09-26 DIAGNOSIS — M545 Low back pain: Secondary | ICD-10-CM

## 2018-09-26 DIAGNOSIS — M25562 Pain in left knee: Secondary | ICD-10-CM

## 2018-09-26 DIAGNOSIS — G8929 Other chronic pain: Secondary | ICD-10-CM

## 2018-09-26 DIAGNOSIS — I10 Essential (primary) hypertension: Secondary | ICD-10-CM | POA: Diagnosis not present

## 2018-09-26 DIAGNOSIS — R079 Chest pain, unspecified: Secondary | ICD-10-CM

## 2018-09-26 NOTE — Progress Notes (Signed)
CC- Leg pain- Dx with plantar fascitis left . Now the pain is radiating to left leg, knee, foot and lower back all on the left side. And would like to discuss what the next step. May need a referral to see Ortho. Bp has ben running 171/116 due to all the pain I have been having.

## 2018-09-26 NOTE — Progress Notes (Signed)
.   Virtual Visit via Telephone Note  I connected with [name] on 09/26/18 at 7:37 PM   by telephone and verified that I am speaking with the correct person using two identifiers.   I discussed the limitations, risks, security and privacy concerns of performing an evaluation and management service by telephone and the availability of in person appointments. I also discussed with the patient that there may be a patient responsible charge related to this service. The patient expressed understanding and agreed to proceed, consent obtained.   Chief complaint:  Leg pain, elevated blood pressure  History of Present Illness: Lee Krause is a 53 y.o. male  Left leg pain: Knee pain - for years - told needs knee replacement/no meniscus at [facility] (years ago, treated with brace), no recent ortho follow up. Had cortisone shot in past, not visco.  Still having pain in knee. Wears bilateral knee braces.  Occasional tylenol.  Walking on concrete all day.  Pain in heel that moves up leg - ankle sometimes shoots to top of foot and into lower back as well.  Heel pain and foot pain going on for past year.  Back pain past year.  No bowel or bladder incontinence, no saddle anesthesia, no lower extremity weakness.  wearing Dr. Felicie Morn insert in shoe.- may need new one.   Hypertension: BP Readings from Last 3 Encounters:  08/01/18 120/76  05/25/18 131/90  01/17/18 120/78   Lab Results  Component Value Date   CREATININE 1.14 05/25/2018   Reports reading of 171/116 last week, came down after that one day after more relaxed - still not gone back to normal.  Was not having chest pain, headache, blurry vision,or focal weakness.  Occasional chest pain. None right now.  Has appt with Dr. Irish Lack at [facility] tomorrow.  Chest pain on and off during the day at work - lifting heavy objects at that time. Thinks due to dust, but no cough.  Sometimes short of breath - has to slow down a little bit.   50% stenosis on 1st OM and eccentric 50% stenosis of LAD on heart cath in 09/2017.  Did not take hydrochlorothiazide today - usually does not miss doses prior to today.   FH of heart disease - brother died at age 20.     Patient Active Problem List   Diagnosis Date Noted  . CAD (coronary artery disease) 09/28/2017  . Hyperlipidemia 09/28/2017  . Atypical chest pain 04/05/2017  . Essential hypertension 04/05/2017  . Hematochezia   . Lower GI bleed 05/22/2016  . Family history of ischemic heart disease 11/18/2012  . Tobacco use disorder 11/18/2012   Past Medical History:  Diagnosis Date  . Allergy   . Anemia   . Angina at rest [facility])   . Arthritis   . Chest pain   . Gastritis   . Gastrointestinal hemorrhage   . GERD (gastroesophageal reflux disease)   . Hyperlipidemia   . Hypertension   . Tobacco abuse    Past Surgical History:  Procedure Laterality Date  . INTRAVASCULAR PRESSURE WIRE/FFR STUDY N/A 09/24/2017   Procedure: INTRAVASCULAR PRESSURE WIRE/FFR STUDY;  Surgeon: Belva Crome, MD;  Location: [place] [facility];  Service: Cardiovascular;  Laterality: N/A;  . LEFT HEART CATH AND CORONARY ANGIOGRAPHY N/A 09/24/2017   Procedure: LEFT HEART CATH AND CORONARY ANGIOGRAPHY;  Surgeon: Belva Crome, MD;  Location: [place] [facility];  Service: Cardiovascular;  Laterality: N/A;  . TOOTH EXTRACTION  No Known Allergies Prior to Admission medications   Medication Sig Start Date End Date Taking? Authorizing Provider  acetaminophen (TYLENOL) 650 MG CR tablet Take 650 mg by mouth every 8 (eight) hours as needed for pain.   Yes [provider]  aspirin EC 81 MG tablet Take 81 mg by mouth daily.   Yes [provider]  atorvastatin (LIPITOR) 40 MG tablet Take 1 tablet (40 mg total) by mouth daily at 6 PM. 05/30/18  Yes Wendie Agreste, MD  clopidogrel (PLAVIX) 75 MG tablet Take 1 tablet (75 mg total) by mouth daily. 09/28/17  Yes Imogene Burn, PA-C   hydrochlorothiazide (MICROZIDE) 12.5 MG capsule Take 1 capsule by mouth once daily 05/26/18  Yes Wendie Agreste, MD  metoprolol succinate (TOPROL-XL) 25 MG 24 hr tablet TAKE 1 TABLET BY MOUTH ONCE DAILY WITH OR IMMEADIATELY FOLLOWING A MEAL 09/12/18  Yes Imogene Burn, PA-C  Multiple Vitamin (MULTIVITAMIN WITH MINERALS) TABS tablet Take 1 tablet by mouth daily.   Yes [provider]  predniSONE (DELTASONE) 20 MG tablet Take 3 tablets once daily for 5 days, then 2 tablets once daily for 2 days, then 1 tablet once daily for 2 days 05/25/18  Yes Malvin Johns, MD  valACYclovir (VALTREX) 1000 MG tablet Take 1 tablet (1,000 mg total) by mouth 3 (three) times daily. 05/25/18  Yes Malvin Johns, MD   Social History   Socioeconomic History  . Marital status: Married    Spouse name: Not on file  . Number of children: Not on file  . Years of education: Not on file  . Highest education level: Not on file  Occupational History  . Not on file  Social Needs  . Financial resource strain: Not on file  . Food insecurity    Worry: Not on file    Inability: Not on file  . Transportation needs    Medical: Not on file    Non-medical: Not on file  Tobacco Use  . Smoking status: Former Smoker    Packs/day: 0.25    Types: Cigarettes  . Smokeless tobacco: Never Used  . Tobacco comment: Quit 2 weeks ago.  Substance and Sexual Activity  . Alcohol use: Yes    Alcohol/week: 6.0 standard drinks    Types: 6 Cans of beer per week  . Drug use: No  . Sexual activity: Yes  Lifestyle  . Physical activity    Days per week: Not on file    Minutes per session: Not on file  . Stress: Not on file  Relationships  . Social Herbalist on phone: Not on file    Gets together: Not on file    Attends religious service: Not on file    Active member of club or organization: Not on file    Attends meetings of clubs or organizations: Not on file    Relationship status: Not on file  . Intimate  partner violence    Fear of current or ex partner: Not on file    Emotionally abused: Not on file    Physically abused: Not on file    Forced sexual activity: Not on file  Other Topics Concern  . Not on file  Social History Narrative  . Not on file     Observations/Objective: Blood pressure during call: 167/104. HR 88.  Speaking normally, no distress.  Appropriate responses.   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  Could have component of degenerative changes of back/sciatica with heel pain reported as well.  Option of eval of back and heel pain in office but he will be seen by orthopedics for knee and can discuss with them at that time.  In the meantime recommended Tylenol 4 times daily as needed, silicone gel insert, continue brace for knee as needed.  RTC/ER precautions if acute worsening  Essential hypertension Chest pain, unspecified type  -Did not take meds yet today, but elevaterd readings prior.   -Take Toprol tonight  -restart hydrochlorothiazide at usual time tomorrow morning.  Has follow-up with cardiology tomorrow, can discuss hypertensive regimen at that time after evaluation.  -Intermittent chest pains, concerning that he is having these with activity, has follow-up with cardiology tomorrow.  Note given to avoid any exertional work until that evaluation with overnight ER/911 chest pain precautions reviewed.  Follow Up Instructions:  With cardiology tomorrow, ortho appt.    I discussed the assessment and treatment plan with the patient. The patient was provided an opportunity to ask questions and all were  answered. The patient agreed with the plan and demonstrated an understanding of the instructions.   The patient was advised to call back or seek an in-person evaluation if the symptoms worsen or if the condition fails to improve as anticipated.  I provided 37 minutes of non-face-to-face time during this encounter.  Signed,   Merri Ray, MD Primary Care at [place].  09/26/18

## 2018-09-26 NOTE — Patient Instructions (Addendum)
Ok to use brace for knee pain.  Silicone gel insert for heel pain until seen by ortho.  I will refer you to ortho for your knee pain, back pain and heel pain, but see me sooner if any of those areas worsen.  Tylenol over the counter up to 4 times per day.  Return to the clinic or go to the nearest emergency room if any of your symptoms worsen or new symptoms occur.  Avoid any exertion until evaluated by cardiology tomorrow. If any chest pain overnight - go to the emergency room. Take metoprolol tonight.  Restart hydrocholorothiazide tomorrow morning. Can discuss blood pressure and any necessary changes with cardiologist tomorrow.

## 2018-09-27 ENCOUNTER — Encounter: Payer: Self-pay | Admitting: Interventional Cardiology

## 2018-09-27 ENCOUNTER — Other Ambulatory Visit: Payer: Self-pay

## 2018-09-27 ENCOUNTER — Ambulatory Visit (INDEPENDENT_AMBULATORY_CARE_PROVIDER_SITE_OTHER): Payer: 59 | Admitting: Interventional Cardiology

## 2018-09-27 VITALS — BP 152/98 | HR 71 | Ht 76.0 in | Wt 240.6 lb

## 2018-09-27 DIAGNOSIS — I10 Essential (primary) hypertension: Secondary | ICD-10-CM | POA: Diagnosis not present

## 2018-09-27 DIAGNOSIS — I2510 Atherosclerotic heart disease of native coronary artery without angina pectoris: Secondary | ICD-10-CM

## 2018-09-27 DIAGNOSIS — F17200 Nicotine dependence, unspecified, uncomplicated: Secondary | ICD-10-CM | POA: Diagnosis not present

## 2018-09-27 DIAGNOSIS — Z8249 Family history of ischemic heart disease and other diseases of the circulatory system: Secondary | ICD-10-CM | POA: Diagnosis not present

## 2018-09-27 DIAGNOSIS — R0602 Shortness of breath: Secondary | ICD-10-CM

## 2018-09-27 DIAGNOSIS — E782 Mixed hyperlipidemia: Secondary | ICD-10-CM

## 2018-09-27 MED ORDER — AMLODIPINE BESYLATE 5 MG PO TABS: 5.0000 mg | ORAL_TABLET | Freq: Every day | ORAL | 3 refills | Status: DC

## 2018-09-27 NOTE — Patient Instructions (Addendum)
Medication Instructions:  Your physician has recommended you make the following change in your medication:   START: amlodipine 5 mg tablet: Take 1 tablet by mouth once a day   Lab work: None Ordered  If you have labs (blood work) drawn today and your tests are completely normal, you will receive your results only by: Marland Kitchen MyChart Message (if you have MyChart) OR . A paper copy in the mail If you have any lab test that is abnormal or we need to change your treatment, we will call you to review the results.  Testing/Procedures: None ordered  Follow-Up:  You have been referred to Pulmonology  . Follow up with Dr. Irish Lack via VIDEO Visit on 10/13/18 at 10:20 AM  Any Other Special Instructions Will Be Listed Below (If Applicable).

## 2018-09-27 NOTE — Progress Notes (Signed)
Cardiology Office Note   Date:  09/27/2018   ID:  Lee Krause, DOB 20-Jul-1965, MRN [id number]  PCP:  Wendie Agreste, MD    No chief complaint on file.  CAD  Wt Readings from Last 3 Encounters:  09/27/18 240 lb 9.6 oz (109.1 kg)  08/01/18 240 lb (108.9 kg)  05/25/18 230 lb (104.3 kg)       History of Present Illness: Lee Krause is a 53 y.o. male  with history of atypical chest pain, family history of early CAD with brother dying of an MI at age 53, hypertension, HLD, tobacco abuse. Nuclear stress test 03/2017 low normal EF 50% hypertensive response to exercise low risk study, 2D echo normal.  Patient continued to have chest pains a cardiac catheterization was recommended and performed on 09/24/2017 by Dr. 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   Normal left main.  Circumflex coronary artery is widely patent but there is ostial 50% narrowing in the first obtuse marginal.  Eccentric 50% stenosis in proximal to mid LAD after the large first diagonal. Because of the eccentricity and current clinical presentation, FFR was performed and demonstrated that the lesion was not hemodynamically significant with a value of 0.89.  Normal LV function. Normal LVEDP. Estimated EF 55%.  RECOMMENDATIONS:   Aggressive risk factor modification: High intensity statin therapy to reduce LDL less than 70; blood pressure less than 130/80 mmHg; smoking cessation; moderate intensity aerobic activity greater than or equal to 150 minutes/week; and, close monitoring of glycemic control.  Seems it would be safe to discontinue long-acting nitrates since they are not helping and causing headache of significance.  Consider adding Plavix 75 mg/day given family history and other risk factors,    He continues to have some intermittent chest discomfort and SHOB- never went away after the procedure.  He did not tolerate  Imdur.  His job is physically strenuous.  He had been exercising and dieting.  He stopped being as strict.  Since the last visit, he has continued to have issues with [facility] at work.  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  He would like to see a pulmonary MD.    BP has been higher as well, since he has had more pain from his foot and knees.    Denies : . Dizziness. Leg edema. Nitroglycerin use. Orthopnea. Palpitations. Paroxysmal nocturnal dyspnea.  Syncope.   He has not used NTG.  He does not feel that his sx are cardiac.  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  He does exercise at home.  When he is active outdoors, he hsa no sx.  He has a 75 year old son and they are active.  Sx mostly occur in the warehouse, about 90-95%.  BP has been high and he is taking meds.  Readings are in the 150s often.   Past Medical History:  Diagnosis Date  . Allergy   . Anemia   . Angina at rest [facility])   . Arthritis   . Chest pain   . Gastritis   . Gastrointestinal hemorrhage   . GERD (gastroesophageal reflux disease)   . Hyperlipidemia   . Hypertension   . Tobacco abuse     Past Surgical History:  Procedure Laterality Date  . INTRAVASCULAR PRESSURE WIRE/FFR STUDY N/A 09/24/2017   Procedure:  INTRAVASCULAR PRESSURE WIRE/FFR STUDY;  Surgeon: Belva Crome, MD;  Location: [place] [facility];  Service: Cardiovascular;  Laterality: N/A;  . LEFT HEART CATH AND CORONARY ANGIOGRAPHY N/A 09/24/2017   Procedure: LEFT HEART CATH AND CORONARY ANGIOGRAPHY;  Surgeon: Belva Crome, MD;  Location: [place] [facility];  Service: Cardiovascular;  Laterality: N/A;  . TOOTH EXTRACTION       Current Outpatient Medications  Medication Sig Dispense Refill  . acetaminophen (TYLENOL) 650 MG CR tablet Take 650 mg by mouth every 8 (eight) hours as needed for pain.    Marland Kitchen aspirin EC 81 MG tablet Take 81 mg by mouth daily.    Marland Kitchen atorvastatin  (LIPITOR) 40 MG tablet Take 1 tablet (40 mg total) by mouth daily at 6 PM. 90 tablet 1  . clopidogrel (PLAVIX) 75 MG tablet Take 1 tablet (75 mg total) by mouth daily. 90 tablet 3  . hydrochlorothiazide (MICROZIDE) 12.5 MG capsule Take 1 capsule by mouth once daily 90 capsule 0  . metoprolol succinate (TOPROL-XL) 25 MG 24 hr tablet TAKE 1 TABLET BY MOUTH ONCE DAILY WITH OR IMMEADIATELY FOLLOWING A MEAL 90 tablet 0  . Multiple Vitamin (MULTIVITAMIN WITH MINERALS) TABS tablet Take 1 tablet by mouth daily.     No current facility-administered medications for this visit.     Allergies:   Patient has no known allergies.    Social History:  The patient  reports that he has quit smoking. His smoking use included cigarettes. He smoked 0.25 packs per day. He has never used smokeless tobacco. He reports current alcohol use of about 6.0 standard drinks of alcohol per week. He reports that he does not use drugs.   Family History:  The patient's family history includes Heart attack in his brother; Hypertension in his father.    ROS:  Please see the history of present illness.   Otherwise, review of systems are positive for increased BP.   All other systems are reviewed and negative.    PHYSICAL EXAM: VS:  BP (!) 152/98   Pulse 71   Ht 6\' 4"  (1.93 m)   Wt 240 lb 9.6 oz (109.1 kg)   SpO2 96%   BMI 29.29 kg/m  , BMI Body mass index is 29.29 kg/m. GEN: Well nourished, well developed, in no acute distress  HEENT: normal  Neck: no JVD, carotid bruits, or masses Cardiac: RRR; no murmurs, rubs, or gallops,no edema  Respiratory:  clear to auscultation bilaterally, normal work of breathing GI: soft, nontender, nondistended, + BS MS: no deformity or atrophy  Skin: warm and dry, no rash Neuro:  Strength and sensation are intact Psych: euthymic mood, full affect   EKG:   The ekg ordered today demonstrates NSR, no ST changes, LVH   Recent Labs: 05/25/2018: BUN 13; Creatinine, Ser 1.14; Hemoglobin  13.7; Platelets 281; Potassium 4.0; Sodium 135   Lipid Panel    Component Value Date/Time   CHOL 233 (H) 08/05/2017 1508   TRIG 285 (H) 08/05/2017 1508   HDL 57 08/05/2017 1508   CHOLHDL 4.1 08/05/2017 1508   CHOLHDL 4.1 04/06/2017 0539   VLDL 30 04/06/2017 0539   LDLCALC 119 (H) 08/05/2017 1508   LDLDIRECT 130.6 11/25/2012 1540     Other studies Reviewed: Additional studies/ records that were reviewed today with results demonstrating: cath results and labs reviewed.   ASSESSMENT AND PLAN:  1. CAD: No angina. Continue aggressive secondary prevention. BP control and amlodipine may help chest pains.  2. HTN: Start amlodipine.  May help with CP and will help with BP.  Start 5 mg daily and will see what home BP readings do.  3. Family h/o CAD: Brother with sudden death at 16.  4. Tobacco abuse: Smoking minimally.  He needs to stop. 5. Refer to pulmonary given pattern of DOE. Will keep him out of work for a few days.  Return Monday. 6. Hyperlipidemia: LDL 119 in 7/19.  Will need recheck when fasting.  For now, contiue atorvastatin.    Current medicines are reviewed at length with the patient today.  The patient concerns regarding his medicines were addressed.  The following changes have been made:    Labs/ tests ordered today include:  No orders of the defined types were placed in this encounter.   Recommend 150 minutes/week of aerobic exercise Low fat, low carb, high fiber diet recommended  Disposition:   FU in 2 weeks for BP check, virtual visit   Signed, Larae Grooms, MD  09/27/2018 3:47 PM    [facility] [place], [place], [place]  [address] Phone: [phone]; Fax: [phone]

## 2018-10-13 ENCOUNTER — Other Ambulatory Visit: Payer: Self-pay

## 2018-10-13 ENCOUNTER — Telehealth (INDEPENDENT_AMBULATORY_CARE_PROVIDER_SITE_OTHER): Payer: 59 | Admitting: Interventional Cardiology

## 2018-10-13 ENCOUNTER — Encounter: Payer: Self-pay | Admitting: Interventional Cardiology

## 2018-10-13 VITALS — BP 144/105 | HR 74 | Ht 76.0 in | Wt 240.0 lb

## 2018-10-13 DIAGNOSIS — Z8249 Family history of ischemic heart disease and other diseases of the circulatory system: Secondary | ICD-10-CM | POA: Diagnosis not present

## 2018-10-13 DIAGNOSIS — E782 Mixed hyperlipidemia: Secondary | ICD-10-CM

## 2018-10-13 DIAGNOSIS — F17200 Nicotine dependence, unspecified, uncomplicated: Secondary | ICD-10-CM

## 2018-10-13 DIAGNOSIS — I10 Essential (primary) hypertension: Secondary | ICD-10-CM | POA: Diagnosis not present

## 2018-10-13 DIAGNOSIS — I2510 Atherosclerotic heart disease of native coronary artery without angina pectoris: Secondary | ICD-10-CM | POA: Diagnosis not present

## 2018-10-13 MED ORDER — AMLODIPINE BESYLATE 10 MG PO TABS: 10.0000 mg | ORAL_TABLET | Freq: Every day | ORAL | 3 refills | Status: AC

## 2018-10-13 NOTE — Patient Instructions (Addendum)
Medication Instructions:  Your physician has recommended you make the following change in your medication:   INCREASE: amlodipine (norvasc) to 10 mg by mouth once a day   Lab work: None Ordered  If you have labs (blood work) drawn today and your tests are completely normal, you will receive your results only by: Marland Kitchen MyChart Message (if you have MyChart) OR . A paper copy in the mail If you have any lab test that is abnormal or we need to change your treatment, we will call you to review the results.  Testing/Procedures: None ordered  Follow-Up: . Follow up with Dr. Irish Lack via VIDEO Visit on 10/27/18 at 2:00 PM  Any Other Special Instructions Will Be Listed Below (If Applicable).

## 2018-10-13 NOTE — Progress Notes (Signed)
Virtual Visit via Video Note   This visit type was conducted due to national recommendations for restrictions regarding the COVID-19 Pandemic (e.g. social distancing) in an effort to limit this patient's exposure and mitigate transmission in our community.  Due to his co-morbid illnesses, this patient is at least at moderate risk for complications without adequate follow up.  This format is felt to be most appropriate for this patient at this time.  All issues noted in this document were discussed and addressed.  A limited physical exam was performed with this format.  Please refer to the patient's chart for his consent to telehealth for [facility].   Date:  10/13/2018   ID:  Lee Krause, DOB 03-Dec-1965, MRN [id number]  Patient Location: Home Provider location: office  PCP:  Wendie Agreste, MD  Cardiologist:  Larae Grooms, MD  Electrophysiologist:  None   Evaluation Performed:  Follow-Up Visit  Chief Complaint:  CAD/HTN  History of Present Illness:    Lee Krause is a 53 y.o. male with with history of atypical chest pain, family history of early CAD with brother dying of an MI at age 65, hypertension, HLD, tobacco abuse. Nuclear stress test 03/2017 low normal EF 50% hypertensive response to exercise low risk study, 2D echo normal.  Patient continued to have chest pains a cardiac catheterization was recommended and performed on 9/13/2019by Dr. 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   Normal left main.  Circumflex coronary artery is widely patent but there is ostial 50% narrowing in the first obtuse marginal.  Eccentric 50% stenosis in proximal to mid LAD after the large first diagonal. Because of the eccentricity and current clinical presentation, FFR was performed and demonstrated that the lesion was not hemodynamically significant with a value of 0.89.  Normal LV function. Normal LVEDP.  Estimated EF 55%.  RECOMMENDATIONS:   Aggressive risk factor modification: High intensity statin therapy to reduce LDL less than 70; blood pressure less than 130/80 mmHg; smoking cessation; moderate intensity aerobic activity greater than or equal to 150 minutes/week; and, close monitoring of glycemic control.  Seems it would be safe to discontinue long-acting nitrates since they are not helping and causing headache of significance.  Consider adding Plavix 75 mg/day given family history and other risk factors,   He continues to have some intermittent chest discomfort and SHOB- never went away after the procedure. He did not tolerate Imdur. His job is physically strenuous. He had been exercising and dieting. He stopped being as strict.  At the visit in 09/2018, he has continued to have issues with [facility] at work.  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  He would like to see a pulmonary MD.    BP has been higher as well, since he has had more pain from his foot and knees.    Plan at last visit: "Start amlodipine.  May help with CP and will help with BP.  Start 5 mg daily and will see what home BP readings do."  He is on a video visit to f/u on BP.  BP has better.  Readings have been in the 0000000 systolic.  He has had some back pain after some yardwork.    CP has been better when his BP is lower.    He has been walking  With his son, and he felt with activity.  The  patient does not have symptoms concerning for COVID-19 infection (fever, chills, cough, or new shortness of breath).    Past Medical History:  Diagnosis Date  . Allergy   . Anemia   . Angina at rest [facility])   . Arthritis   . Chest pain   . Gastritis   . Gastrointestinal hemorrhage   . GERD (gastroesophageal reflux disease)   . Hyperlipidemia   . Hypertension   . Tobacco abuse    Past Surgical History:  Procedure Laterality Date  . INTRAVASCULAR PRESSURE WIRE/FFR  STUDY N/A 09/24/2017   Procedure: INTRAVASCULAR PRESSURE WIRE/FFR STUDY;  Surgeon: Belva Crome, MD;  Location: [place] [facility];  Service: Cardiovascular;  Laterality: N/A;  . LEFT HEART CATH AND CORONARY ANGIOGRAPHY N/A 09/24/2017   Procedure: LEFT HEART CATH AND CORONARY ANGIOGRAPHY;  Surgeon: Belva Crome, MD;  Location: [place] [facility];  Service: Cardiovascular;  Laterality: N/A;  . TOOTH EXTRACTION       Current Meds  Medication Sig  . acetaminophen (TYLENOL) 650 MG CR tablet Take 650 mg by mouth every 8 (eight) hours as needed for pain.  Marland Kitchen amLODipine (NORVASC) 5 MG tablet Take 1 tablet (5 mg total) by mouth daily.  Marland Kitchen aspirin EC 81 MG tablet Take 81 mg by mouth daily.  Marland Kitchen atorvastatin (LIPITOR) 40 MG tablet Take 1 tablet (40 mg total) by mouth daily at 6 PM.  . clopidogrel (PLAVIX) 75 MG tablet Take 1 tablet (75 mg total) by mouth daily.  . hydrochlorothiazide (MICROZIDE) 12.5 MG capsule Take 1 capsule by mouth once daily  . metoprolol succinate (TOPROL-XL) 25 MG 24 hr tablet TAKE 1 TABLET BY MOUTH ONCE DAILY WITH OR IMMEADIATELY FOLLOWING A MEAL  . Multiple Vitamin (MULTIVITAMIN WITH MINERALS) TABS tablet Take 1 tablet by mouth daily.     Allergies:   Patient has no known allergies.   Social History   Tobacco Use  . Smoking status: Former Smoker    Packs/day: 0.25    Types: Cigarettes  . Smokeless tobacco: Never Used  . Tobacco comment: Quit 2 weeks ago.  Substance Use Topics  . Alcohol use: Yes    Alcohol/week: 6.0 standard drinks    Types: 6 Cans of beer per week  . Drug use: No     Family Hx: The patient's family history includes Heart attack in his brother; Hypertension in his father. There is no history of Colon cancer, Esophageal cancer, Rectal cancer, or Stomach cancer.  ROS:   Please see the history of present illness.    Chest pain is better with better BP All other systems reviewed and are negative.   Prior CV studies:   The following studies were  reviewed today:  As above  Labs/Other Tests and Data Reviewed:    EKG:  No ECG reviewed.  Recent Labs: 05/25/2018: BUN 13; Creatinine, Ser 1.14; Hemoglobin 13.7; Platelets 281; Potassium 4.0; Sodium 135   Recent Lipid Panel Lab Results  Component Value Date/Time   CHOL 233 (H) 08/05/2017 03:08 PM   TRIG 285 (H) 08/05/2017 03:08 PM   HDL 57 08/05/2017 03:08 PM   CHOLHDL 4.1 08/05/2017 03:08 PM   CHOLHDL 4.1 04/06/2017 05:39 AM   LDLCALC 119 (H) 08/05/2017 03:08 PM   LDLDIRECT 130.6 11/25/2012 03:40 PM    Wt Readings from Last 3 Encounters:  10/13/18 240 lb (108.9 kg)  09/27/18 240 lb 9.6 oz (109.1 kg)  08/01/18 240 lb (108.9 kg)     Objective:  Vital Signs:  BP (!) 158/108   Pulse 84   Ht 6\' 4"  (1.93 m)   Wt 240 lb (108.9 kg)   BMI 29.21 kg/m    VITAL SIGNS:  reviewed GEN:  no acute distress EYES:  sclerae anicteric, EOMI - Extraocular Movements Intact RESPIRATORY:  normal respiratory effort, symmetric expansion NEURO:  alert and oriented x 3, no obvious focal deficit PSYCH:  normal affect limited exam due to video format  ASSESSMENT & PLAN:    1. CAD: Much less angina since adding amlodipine.  OK for cardio exercise.  Avoid heavy weight lifting. 2. HTN: BP still high.  Increase amlodipine to 10 mg daily.  If BP still high, change metoprolol to carvedilol for better BP control.  3. Tobacco abuse: Needs to avoid tobacco. 4. Family h/o CAD: Brother with sudden death at 77.  67. Hyperlipidemia: Will need LDL < 100.  Plan for labs in the near future.  COVID-19 Education: The signs and symptoms of COVID-19 were discussed with the patient and how to seek care for testing (follow up with PCP or arrange E-visit).  The importance of social distancing was discussed today.  Time:   Today, I have spent 15 minutes with the patient with telehealth technology discussing the above problems.     Medication Adjustments/Labs and Tests Ordered: Current medicines are reviewed  at length with the patient today.  Concerns regarding medicines are outlined above.   Tests Ordered: No orders of the defined types were placed in this encounter.   Medication Changes: No orders of the defined types were placed in this encounter.   Follow Up:  Virtual Visit in 3 week(s)  Signed, Larae Grooms, MD  10/13/2018 10:49 AM    [place]

## 2018-10-19 ENCOUNTER — Ambulatory Visit: Payer: 59 | Admitting: Pulmonary Disease

## 2018-10-19 ENCOUNTER — Other Ambulatory Visit: Payer: Self-pay

## 2018-10-19 ENCOUNTER — Encounter: Payer: Self-pay | Admitting: Pulmonary Disease

## 2018-10-19 ENCOUNTER — Ambulatory Visit (INDEPENDENT_AMBULATORY_CARE_PROVIDER_SITE_OTHER): Payer: 59

## 2018-10-19 VITALS — BP 138/64 | HR 91 | Ht 76.0 in | Wt 239.8 lb

## 2018-10-19 DIAGNOSIS — R0600 Dyspnea, unspecified: Secondary | ICD-10-CM | POA: Diagnosis not present

## 2018-10-19 DIAGNOSIS — R0609 Other forms of dyspnea: Secondary | ICD-10-CM

## 2018-10-19 NOTE — Progress Notes (Signed)
Subjective:     Patient ID: Lee Krause, male   DOB: 08/25/1965, 53 y.o.   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 Allergy   . Anemia   . Angina at rest [facility])   . Arthritis   . Chest pain   . Gastritis   . Gastrointestinal hemorrhage   . GERD (gastroesophageal reflux disease)   . Hyperlipidemia   . Hypertension   . Tobacco abuse    Social History   Socioeconomic History  . Marital status: Married    Spouse name: Not on file  . Number of children: Not on file  . Years of education: Not on file  . Highest education level: Not on file  Occupational History  . Not on file  Social Needs  . Financial resource strain: Not on file  . Food insecurity    Worry: Not on file    Inability: Not on file  . Transportation needs    Medical: Not on file    Non-medical: Not on file  Tobacco Use  . Smoking status: Former Smoker    Packs/day: 0.25    Types: Cigarettes    Quit date: 10/18/2017    Years since quitting: 1.0  . Smokeless tobacco: Never Used  Substance and Sexual Activity  . Alcohol use: Yes    Alcohol/week: 6.0 standard drinks    Types: 6 Cans of beer per week  . Drug use: No  . Sexual activity: Yes  Lifestyle  . Physical activity    Days per week: Not on file    Minutes per session: Not on file  . Stress: Not on file  Relationships  . Social Herbalist on phone: Not on file    Gets  together: Not on file    Attends religious service: Not on file    Active member of club or organization: Not on file    Attends meetings of clubs or organizations: Not on file    Relationship status: Not on file  . Intimate partner violence    Fear of current or ex partner: Not on file    Emotionally abused: Not on file    Physically abused: Not on file    Forced sexual activity: Not on file  Other Topics Concern  . Not on file  Social History Narrative  . Not on file   Family History  Problem Relation Age of Onset  . Hypertension Father   . Heart attack Brother   . Colon cancer Neg Hx   . Esophageal cancer Neg Hx   . Rectal cancer Neg Hx   . Stomach cancer Neg Hx      Review of Systems  Constitutional: Negative for fever and unexpected weight change.  HENT: Negative for congestion, dental problem, ear pain, nosebleeds, postnasal drip, rhinorrhea, sinus pressure, sneezing, sore throat and trouble swallowing.   Eyes: Negative for  redness and itching.  Respiratory: Positive for shortness of breath. Negative for cough, chest tightness and wheezing.   Cardiovascular: Negative for palpitations and leg swelling.  Gastrointestinal: Negative for nausea and vomiting.  Genitourinary: Negative for dysuria.  Musculoskeletal: Negative for joint swelling.  Skin: Negative for rash.  Allergic/Immunologic: Negative.  Negative for environmental allergies, food allergies and immunocompromised state.  Neurological: Negative for headaches.  Hematological: Does not bruise/bleed easily.  Psychiatric/Behavioral: Negative for dysphoric mood. The patient is not nervous/anxious.        Objective:   Physical Exam Vitals signs reviewed.  Constitutional:      Appearance: Normal appearance.  HENT:     Head: Normocephalic and atraumatic.     Nose: Nose normal. No congestion.  Eyes:     Extraocular Movements: Extraocular movements intact.     Pupils: Pupils are equal, round, and reactive to  light.  Neck:     Musculoskeletal: Normal range of motion and neck supple. No neck rigidity or muscular tenderness.  Cardiovascular:     Rate and Rhythm: Normal rate and regular rhythm.     Pulses: Normal pulses.     Heart sounds: Normal heart sounds. No murmur. No friction rub.  Pulmonary:     Effort: Pulmonary effort is normal. No respiratory distress.     Breath sounds: Normal breath sounds. No stridor. No wheezing, rhonchi or rales.  Abdominal:     General: Abdomen is flat. There is no distension.     Palpations: There is no mass.     Tenderness: There is no abdominal tenderness.  Musculoskeletal: Normal range of motion.        General: No swelling.  Skin:    General: Skin is warm and dry.     Coloration: Skin is not jaundiced.  Neurological:     General: No focal deficit present.     Mental Status: He is alert.  Psychiatric:        Mood and Affect: Mood normal.        Behavior: Behavior normal.    Vitals:   10/19/18 0929  BP: 138/64  Pulse: 91  SpO2: 96%      Assessment:     Shortness of breath with activity -This usually occurs with heavy lifting -Occasional shortness of breath at rest -Not limited when he is walking on level ground  Chest discomfort at rest -This is less likely to be related to lung disease -With his history of heart disease, may have macrovascular or microvascular disease contributing to this  Occupational exposure to dust/fumes  -No description of shortness of breath just been in a work environment but just with heavy lifting -Doubt occupational lung disease  Reformed smoker -Admits smoking about a pack a week to a quarter a pack a day at most -This degree of smoking may still contribute to lung disease  I am not certain that there is significant lung disease contributing to symptoms at present Especially with no significant predisposition, limited smoking history I favor that microvascular coronary artery disease may be contributing to  symptoms    Hypertension Plan:     We will obtain a pulmonary function test to assess for lung disease -Most people will not be limited with activities with normal pulmonary function tests  Repeat a chest x-ray -Previous x-rays last year did not reveal any significant lung disease  I encouraged him to protect himself at work, using a mask  Should consider transferring within the work environment to something that  does not involve lifting heavy materials  Will suggest to consider repeating cardiovascular work-up as patient was complaining of chest discomfort even while at rest  I will follow-up in about 4 to 6 weeks

## 2018-10-19 NOTE — Patient Instructions (Signed)
Shortness of breath and chest tightness with heavy lifting Chest tightness at rest   We will obtain a PFT Repeat chest x-ray We will suggest to repeat echocardiogram  We will see you back in about 4 to 6 weeks  Physical activity as tolerated

## 2018-10-25 ENCOUNTER — Other Ambulatory Visit: Payer: Self-pay

## 2018-10-25 ENCOUNTER — Telehealth (INDEPENDENT_AMBULATORY_CARE_PROVIDER_SITE_OTHER): Payer: 59 | Admitting: Interventional Cardiology

## 2018-10-25 VITALS — Ht 76.0 in | Wt 239.0 lb

## 2018-10-25 DIAGNOSIS — I10 Essential (primary) hypertension: Secondary | ICD-10-CM

## 2018-10-25 DIAGNOSIS — I2510 Atherosclerotic heart disease of native coronary artery without angina pectoris: Secondary | ICD-10-CM

## 2018-10-25 DIAGNOSIS — Z8249 Family history of ischemic heart disease and other diseases of the circulatory system: Secondary | ICD-10-CM | POA: Diagnosis not present

## 2018-10-25 DIAGNOSIS — F17200 Nicotine dependence, unspecified, uncomplicated: Secondary | ICD-10-CM

## 2018-10-25 DIAGNOSIS — E782 Mixed hyperlipidemia: Secondary | ICD-10-CM

## 2018-10-25 NOTE — Progress Notes (Signed)
Virtual Visit via Video Note   This visit type was conducted due to national recommendations for restrictions regarding the COVID-19 Pandemic (e.g. social distancing) in an effort to limit this patient's exposure and mitigate transmission in our community.  Due to his co-morbid illnesses, this patient is at least at moderate risk for complications without adequate follow up.  This format is felt to be most appropriate for this patient at this time.  All issues noted in this document were discussed and addressed.  A limited physical exam was performed with this format.  Please refer to the patient's chart for his consent to telehealth for [facility].   Date:  10/25/2018   ID:  Lee Krause, DOB 03/18/1965, MRN [id number]  Patient Location: Home Provider Location: Office  PCP:  Wendie Agreste, MD  Cardiologist:  Larae Grooms, MD  Electrophysiologist:  None   Evaluation Performed:  Follow-Up Visit  Chief Complaint:  HTN/CAD  History of Present Illness:    Lee Krause is a 53 y.o. male with history of atypical chest pain, family history of early CAD with brother dying of an MI at age 39, hypertension, HLD, tobacco abuse. Nuclear stress test 03/2017 low normal EF 50% hypertensive response to exercise low risk study, 2D echo normal.  Patient continued to have chest pains a cardiac catheterization was recommended and performed on 9/13/2019by Dr. 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   Normal left main.  Circumflex coronary artery is widely patent but there is ostial 50% narrowing in the first obtuse marginal.  Eccentric 50% stenosis in proximal to mid LAD after the large first diagonal. Because of the eccentricity and current clinical presentation, FFR was performed and demonstrated that the lesion was not hemodynamically significant with a value of 0.89.  Normal LV function. Normal LVEDP.  Estimated EF 55%.  RECOMMENDATIONS:   Aggressive risk factor modification: High intensity statin therapy to reduce LDL less than 70; blood pressure less than 130/80 mmHg; smoking cessation; moderate intensity aerobic activity greater than or equal to 150 minutes/week; and, close monitoring of glycemic control.  Seems it would be safe to discontinue long-acting nitrates since they are not helping and causing headache of significance.  Consider adding Plavix 75 mg/day given family history and other risk factors,   He continued to have some intermittent chest discomfort and SHOB- never went away after the procedure. He did not tolerate Imdur. His job is physically strenuous. He had been exercising and dieting. He stopped being as strict.  At the visit in 09/2018, he has continued to have issues with [facility] at work. He is working in a Secondary school teacher. THis can cause fom chest pressure as well. He wanted to see a pulmonary MD.   BP has been higher as well, since he has had more pain from his foot and knees.  Started amlodipine 5 mg daily.  BP was better.  Readings have been in the 0000000 systolic.  He has had some back pain after some yardwork.    CP has been better when his BP is lower.    At last visit: "Increase amlodipine to 10 mg daily.  If BP still high, change metoprolol to carvedilol for better BP control. "   Since the last visit, BP readings have been better.  Chest has felt better as well.   Denies : Chest pain. Dizziness. Leg edema. Nitroglycerin use. Orthopnea. Palpitations. Paroxysmal nocturnal dyspnea. Shortness  of breath. Syncope.   He will be getting PFTs.    He feels well when he is walking.  The patient does not have symptoms concerning for COVID-19 infection (fever, chills, cough, or new shortness of breath).    Past Medical History:  Diagnosis Date  . Allergy   . Anemia   . Angina at rest [facility])   . Arthritis   . Chest pain   . Gastritis   .  Gastrointestinal hemorrhage   . GERD (gastroesophageal reflux disease)   . Hyperlipidemia   . Hypertension   . Tobacco abuse    Past Surgical History:  Procedure Laterality Date  . INTRAVASCULAR PRESSURE WIRE/FFR STUDY N/A 09/24/2017   Procedure: INTRAVASCULAR PRESSURE WIRE/FFR STUDY;  Surgeon: Belva Crome, MD;  Location: [place] [facility];  Service: Cardiovascular;  Laterality: N/A;  . LEFT HEART CATH AND CORONARY ANGIOGRAPHY N/A 09/24/2017   Procedure: LEFT HEART CATH AND CORONARY ANGIOGRAPHY;  Surgeon: Belva Crome, MD;  Location: [place] [facility];  Service: Cardiovascular;  Laterality: N/A;  . TOOTH EXTRACTION       Current Meds  Medication Sig  . acetaminophen (TYLENOL) 650 MG CR tablet Take 650 mg by mouth every 8 (eight) hours as needed for pain.  Marland Kitchen amLODipine (NORVASC) 10 MG tablet Take 1 tablet (10 mg total) by mouth daily.  Marland Kitchen aspirin EC 81 MG tablet Take 81 mg by mouth daily.  Marland Kitchen atorvastatin (LIPITOR) 40 MG tablet Take 1 tablet (40 mg total) by mouth daily at 6 PM.  . clopidogrel (PLAVIX) 75 MG tablet Take 1 tablet (75 mg total) by mouth daily.  . hydrochlorothiazide (MICROZIDE) 12.5 MG capsule Take 1 capsule by mouth once daily  . metoprolol succinate (TOPROL-XL) 25 MG 24 hr tablet TAKE 1 TABLET BY MOUTH ONCE DAILY WITH OR IMMEADIATELY FOLLOWING A MEAL  . Multiple Vitamin (MULTIVITAMIN WITH MINERALS) TABS tablet Take 1 tablet by mouth daily.     Allergies:   Patient has no known allergies.   Social History   Tobacco Use  . Smoking status: Former Smoker    Packs/day: 0.25    Types: Cigarettes    Quit date: 10/18/2017    Years since quitting: 1.0  . Smokeless tobacco: Never Used  Substance Use Topics  . Alcohol use: Yes    Alcohol/week: 6.0 standard drinks    Types: 6 Cans of beer per week  . Drug use: No     Family Hx: The patient's family history includes Heart attack in his brother; Hypertension in his father. There is no history of Colon cancer,  Esophageal cancer, Rectal cancer, or Stomach cancer.  ROS:   Please see the history of present illness.     All other systems reviewed and are negative.   Prior CV studies:   The following studies were reviewed today:  Labs reviewed  Labs/Other Tests and Data Reviewed:    EKG:  An ECG dated 09/2018 was personally reviewed today and demonstrated:  NSR, nonspecific ST changes LVH  Recent Labs: 05/25/2018: BUN 13; Creatinine, Ser 1.14; Hemoglobin 13.7; Platelets 281; Potassium 4.0; Sodium 135   Recent Lipid Panel Lab Results  Component Value Date/Time   CHOL 233 (H) 08/05/2017 03:08 PM   TRIG 285 (H) 08/05/2017 03:08 PM   HDL 57 08/05/2017 03:08 PM   CHOLHDL 4.1 08/05/2017 03:08 PM   CHOLHDL 4.1 04/06/2017 05:39 AM   LDLCALC 119 (H) 08/05/2017 03:08 PM   LDLDIRECT 130.6 11/25/2012 03:40 PM  Wt Readings from Last 3 Encounters:  10/25/18 239 lb (108.4 kg)  10/19/18 239 lb 12.8 oz (108.8 kg)  10/13/18 240 lb (108.9 kg)     Objective:    Vital Signs:  Ht 6\' 4"  (1.93 m)   Wt 239 lb (108.4 kg)   BMI 29.09 kg/m    VITAL SIGNS:  reviewed GEN:  no acute distress RESPIRATORY:  normal respiratory effort, symmetric expansion NEURO:  alert and oriented x 3, no obvious focal deficit PSYCH:  normal affect exam limited by video format  ASSESSMENT & PLAN:    1. HTN: Better controlled. Tolerating amlodipine well.  2. CAD: No angina on medical therapy.  COntinue secondary prevention.  Try to keep LDL < 100.  3. Tobacco abuse: Quit a while ago.   4. FAmily h/o CAD: Brother with premature CAD.  5. Hyperlipidemia: LDL target < 100.  Needs appt for repeat lipids.    COVID-19 Education: The signs and symptoms of COVID-19 were discussed with the patient and how to seek care for testing (follow up with PCP or arrange E-visit).  The importance of social distancing was discussed today.  Time:   Today, I have spent 15 minutes with the patient with telehealth technology discussing the  above problems.     Medication Adjustments/Labs and Tests Ordered: Current medicines are reviewed at length with the patient today.  Concerns regarding medicines are outlined above.   Tests Ordered: No orders of the defined types were placed in this encounter.   Medication Changes: No orders of the defined types were placed in this encounter.   Follow Up:  Either In Person or Virtual Visit in 6 month(s)  Signed, Larae Grooms, MD  10/25/2018 1:51 PM    [place]

## 2018-10-25 NOTE — Patient Instructions (Signed)
Medication Instructions:  Your physician recommends that you continue on your current medications as directed. Please refer to the Current Medication list given to you today.  If you need a refill on your cardiac medications before your next appointment, please call your pharmacy.   Lab work: Your physician recommends that you return for a FASTING lipid profile and complete metabolic panel   If you have labs (blood work) drawn today and your tests are completely normal, you will receive your results only by: Marland Kitchen MyChart Message (if you have MyChart) OR . A paper copy in the mail If you have any lab test that is abnormal or we need to change your treatment, we will call you to review the results.  Testing/Procedures: None ordered  Follow-Up: At [facility], you and your health needs are our priority.  As part of our continuing mission to provide you with exceptional heart care, we have created designated Provider Care Teams.  These Care Teams include your primary Cardiologist (physician) and Advanced Practice Providers (APPs -  Physician Assistants and Nurse Practitioners) who all work together to provide you with the care you need, when you need it. . You will need a follow up appointment in 6 months.  Please call our office 2 months in advance to schedule this appointment.  You may see Casandra Doffing, MD or one of the following Advanced Practice Providers on your designated Care Team:   . Lyda Jester, PA-C . Dayna Dunn, PA-C . Ermalinda Barrios, PA-C  Any Other Special Instructions Will Be Listed Below (If Applicable).

## 2018-10-27 ENCOUNTER — Telehealth: Payer: 59 | Admitting: Interventional Cardiology

## 2018-11-08 ENCOUNTER — Telehealth: Payer: Self-pay

## 2018-11-08 ENCOUNTER — Other Ambulatory Visit: Payer: 59

## 2018-11-08 ENCOUNTER — Other Ambulatory Visit: Payer: Self-pay

## 2018-11-08 DIAGNOSIS — I10 Essential (primary) hypertension: Secondary | ICD-10-CM

## 2018-11-08 DIAGNOSIS — E782 Mixed hyperlipidemia: Secondary | ICD-10-CM

## 2018-11-08 DIAGNOSIS — F17200 Nicotine dependence, unspecified, uncomplicated: Secondary | ICD-10-CM

## 2018-11-08 DIAGNOSIS — Z8249 Family history of ischemic heart disease and other diseases of the circulatory system: Secondary | ICD-10-CM

## 2018-11-08 DIAGNOSIS — I2510 Atherosclerotic heart disease of native coronary artery without angina pectoris: Secondary | ICD-10-CM

## 2018-11-08 LAB — COMPREHENSIVE METABOLIC PANEL
ALT: 24 IU/L (ref 0–44)
AST: 20 IU/L (ref 0–40)
Albumin/Globulin Ratio: 1.7 (ref 1.2–2.2)
Albumin: 4.3 g/dL (ref 4.0–5.0)
Alkaline Phosphatase: 67 IU/L (ref 39–117)
BUN/Creatinine Ratio: 20 (ref 9–20)
BUN: 19 mg/dL (ref 6–24)
Bilirubin Total: 0.6 mg/dL (ref 0.0–1.2)
CO2: 22 mmol/L (ref 20–29)
Calcium: 9.2 mg/dL (ref 8.7–10.2)
Chloride: 103 mmol/L (ref 96–106)
Creatinine, Ser: 0.96 mg/dL (ref 0.76–1.27)
GFR calc Af Amer: 106 mL/min/{1.73_m2} (ref >59)
GFR calc non Af Amer: 92 mL/min/{1.73_m2} (ref >59)
Globulin, Total: 2.6 g/dL (ref 1.5–4.5)
Glucose: 114 mg/dL — ABNORMAL HIGH (ref 65–99)
Potassium: 4.2 mmol/L (ref 3.5–5.2)
Sodium: 138 mmol/L (ref 134–144)
Total Protein: 6.9 g/dL (ref 6.0–8.5)

## 2018-11-08 LAB — LIPID PANEL
Chol/HDL Ratio: 3.3 ratio (ref 0.0–5.0)
Cholesterol, Total: 202 mg/dL — ABNORMAL HIGH (ref 100–199)
HDL: 62 mg/dL (ref >39)
LDL Chol Calc (NIH): 114 mg/dL — ABNORMAL HIGH (ref 0–99)
Triglycerides: 151 mg/dL — ABNORMAL HIGH (ref 0–149)
VLDL Cholesterol Cal: 26 mg/dL (ref 5–40)

## 2018-11-08 NOTE — Telephone Encounter (Signed)
Patient needs a note clearing him to return to work from cardiology. He states that he will probably be out due to his knee but just needs one from cardiology. Made patient aware that I will have Dr. Irish Lack review and write letter tomorrow when he is in the office.

## 2018-11-08 NOTE — Telephone Encounter (Signed)
New message    Patient stopped in for his lab work, he is requesting a note to return to work from Dr Irish Lack, please call when it is ready for pickup

## 2018-11-09 ENCOUNTER — Other Ambulatory Visit: Payer: Self-pay

## 2018-11-09 DIAGNOSIS — E782 Mixed hyperlipidemia: Secondary | ICD-10-CM

## 2018-11-09 MED ORDER — ATORVASTATIN CALCIUM 80 MG PO TABS: 80.0000 mg | ORAL_TABLET | Freq: Every day | ORAL | 3 refills | Status: AC

## 2018-11-09 NOTE — Telephone Encounter (Signed)
Patient made aware that letter left downstairs for patient to pick up.

## 2018-12-02 ENCOUNTER — Other Ambulatory Visit: Payer: Self-pay | Admitting: Family Medicine

## 2018-12-02 DIAGNOSIS — I10 Essential (primary) hypertension: Secondary | ICD-10-CM

## 2018-12-06 ENCOUNTER — Ambulatory Visit: Payer: 59 | Admitting: Pulmonary Disease

## 2019-02-09 ENCOUNTER — Other Ambulatory Visit: Payer: 59 | Admitting: *Deleted

## 2019-02-09 ENCOUNTER — Other Ambulatory Visit: Payer: Self-pay

## 2019-02-09 DIAGNOSIS — E782 Mixed hyperlipidemia: Secondary | ICD-10-CM

## 2019-02-09 LAB — LIPID PANEL
Chol/HDL Ratio: 4 ratio (ref 0.0–5.0)
Cholesterol, Total: 200 mg/dL — ABNORMAL HIGH (ref 100–199)
HDL: 50 mg/dL
LDL Chol Calc (NIH): 102 mg/dL — ABNORMAL HIGH (ref 0–99)
Triglycerides: 282 mg/dL — ABNORMAL HIGH (ref 0–149)
VLDL Cholesterol Cal: 48 mg/dL — ABNORMAL HIGH (ref 5–40)

## 2019-02-09 LAB — HEPATIC FUNCTION PANEL
ALT: 32 [IU]/L (ref 0–44)
AST: 26 [IU]/L (ref 0–40)
Albumin: 4.4 g/dL (ref 3.8–4.9)
Alkaline Phosphatase: 70 [IU]/L (ref 39–117)
Bilirubin Total: 0.5 mg/dL (ref 0.0–1.2)
Bilirubin, Direct: 0.13 mg/dL (ref 0.00–0.40)
Total Protein: 7.2 g/dL (ref 6.0–8.5)

## 2019-02-13 ENCOUNTER — Other Ambulatory Visit: Payer: Self-pay | Admitting: Physician Assistant

## 2019-02-13 ENCOUNTER — Telehealth: Payer: Self-pay | Admitting: Pharmacist

## 2019-02-13 DIAGNOSIS — E782 Mixed hyperlipidemia: Secondary | ICD-10-CM

## 2019-02-13 MED ORDER — NEXLIZET 180-10 MG PO TABS: 1.0000 | ORAL_TABLET | Freq: Every day | ORAL | 3 refills | Status: AC

## 2019-02-13 NOTE — Telephone Encounter (Addendum)
Lipid panel forwarded to PharmD since LDL remains elevated at 102 above goal < 70. Pt currently taking atorvastatin 80mg  daily.  Spoke with pt - will start Nexlizet to lower LDL an additional 40%. Rx has been sent to pharmacy - $0 copay with his insurance. Continue atorvastatin. Also encourage pt to limit intake of carbs, alcohol, and sweets as his TG were elevated at 282.   Will recheck lipids and CMET in 3 months.

## 2019-02-20 ENCOUNTER — Other Ambulatory Visit: Payer: Self-pay | Admitting: Physician Assistant

## 2019-04-18 ENCOUNTER — Other Ambulatory Visit: Payer: Self-pay | Admitting: Family Medicine

## 2019-04-18 DIAGNOSIS — I10 Essential (primary) hypertension: Secondary | ICD-10-CM

## 2019-05-01 ENCOUNTER — Telehealth: Payer: Self-pay | Admitting: *Deleted

## 2019-05-01 NOTE — Telephone Encounter (Signed)
   [facility] Pre-operative Risk Assessment    Request for surgical clearance:  1. What type of surgery is being performed? A LIF L5-S1   2. When is this surgery scheduled? TBD   3. What type of clearance is required (medical clearance vs. Pharmacy clearance to hold med vs. Both)? MEDICAL  4. Are there any medications that need to be held prior to surgery and how long? ASA AND PLAVIX   5. Practice name and name of physician performing surgery? [facility]; DR. [place]   6. What is your office phone number [phone]    7.   What is your office fax number [phone] ATTN: SHERRY WILLS  8.   Anesthesia type (None, local, MAC, general) ? I TRIED TO REACH ORTHOPEDIC OFFICE TO CONFIRM ANESTHESIA. NO ONE PICKED UP AFTER 15 MINUTES.     Lee Krause 05/01/2019, 1:06 PM  _________________________________________________________________   (provider comments below)

## 2019-05-02 NOTE — Telephone Encounter (Signed)
Dr. Irish Lack This patient underwent LHC 09/2017 for ongoing chest pain and SOB following a normal stress test. LHC with nonobstructive disease in the mid-LAD, no intervention. Pt was placed on ASA and plavix for family history and risk factors. We are asked to hold both ASA and plavix for back surgery. OK to hold plavix for 5-7 days?

## 2019-05-02 NOTE — Telephone Encounter (Signed)
OK to hold aspirin and Plavix 7 days prior to back surgery.

## 2019-05-03 NOTE — Telephone Encounter (Signed)
   Primary Cardiologist: Larae Grooms, MD  Chart reviewed as part of pre-operative protocol coverage. I have spoken to him by phone, (while he was riding a stationary bike), and discussed any new or worsening symptoms. He has a history of non obstructive CAD on ASA and Plavix due to family history and CVRF. He is without complaints and stable from CV standpoint.   Given past medical history and time since last visit, based on ACC/AHA guidelines, Lee Krause would be at acceptable risk for the planned procedure without further cardiovascular testing.   He will need to hold ASA and Plavix 7 days prior to the planned procedure. No date has been set yet. I have discussed this with the patient. He verbalizes understanding. Will forward to [facility], Dr. Rolena Infante.   I will route this recommendation to the requesting party via Epic fax function and remove from pre-op pool.  Please call with questions.  Lee Krause. Lee Sather DNP, ANP, AACC  05/03/2019, 9:04 AM

## 2019-05-08 ENCOUNTER — Encounter: Payer: Self-pay | Admitting: Family Medicine

## 2019-05-08 ENCOUNTER — Ambulatory Visit (INDEPENDENT_AMBULATORY_CARE_PROVIDER_SITE_OTHER): Payer: 59

## 2019-05-08 ENCOUNTER — Ambulatory Visit (INDEPENDENT_AMBULATORY_CARE_PROVIDER_SITE_OTHER): Payer: 59 | Admitting: Family Medicine

## 2019-05-08 ENCOUNTER — Other Ambulatory Visit: Payer: Self-pay

## 2019-05-08 VITALS — BP 130/86 | HR 80 | Temp 98.8°F | Ht 76.0 in | Wt 250.0 lb

## 2019-05-08 DIAGNOSIS — Z130 Encounter for screening for diseases of the blood and blood-forming organs and certain disorders involving the immune mechanism: Secondary | ICD-10-CM | POA: Diagnosis not present

## 2019-05-08 DIAGNOSIS — Z01818 Encounter for other preprocedural examination: Secondary | ICD-10-CM

## 2019-05-08 DIAGNOSIS — Z131 Encounter for screening for diabetes mellitus: Secondary | ICD-10-CM

## 2019-05-08 DIAGNOSIS — I10 Essential (primary) hypertension: Secondary | ICD-10-CM

## 2019-05-08 DIAGNOSIS — N529 Male erectile dysfunction, unspecified: Secondary | ICD-10-CM

## 2019-05-08 NOTE — Progress Notes (Signed)
Subjective:  Patient ID: Lee Krause, male    DOB: 1965-05-24  Age: 54 y.o. MRN: [id number]  CC:  Chief Complaint  Patient presents with  . surgical clearance    pt needs clearance from PCP to have sugery on pt's back. on L-5 - L-1 disk replacement. pt states other than his Back pain he feels good  . Erectile Dysfunction    pt would like to ask about getting something to with ED.    HPI CHESKEL QUISPE presents for   Surgical clearance:  Planned L5-S1 disc surgery - ALIF L5-S1. Dr. Rolena Infante for persistent back pain.   He has been followed by cardiology for chest pains and dyspnea.  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  Normal left main.  Circumflex coronary artery patent but ostial 50% narrowing in first obtuse marginal.  Eccentric 50% stenosis in proximal to mid LAD.  Normal LV function.  Normal LVEDP, estimated EF 55% planned aggressive risk factor modification.  Also placed on Plavix 75 mg daily given family history of early coronary disease. He was evaluated by pulmonary in October 2020 for dyspnea on exertion.  Chest x-ray with elevated left hemidiaphragm with mild left basilar subsegmental atelectasis.  Pulmonary function testing was planned, I do not see where that has been resulted  Occasional chest pressure if misses medicine, no recent missed doses. Shortness of breath with overexertion only, better with riding bike. Did not have PFT's - doing too much.   Has been cleared by cardiology - just needs medical clearance.   Erectile dysfunction: Has tried otc herbal supplement. Worked ok, but upset stomach.  Less tumescence, but able to obtain erection, maintain erection. Past 1-2 years. Younger spouse. Doing well otherwise. Stopped taking nitrates. Will call cardiology then let me know if ok with trying sildenafil.   History Patient Active Problem List   Diagnosis Date Noted  . CAD (coronary artery  disease) 09/28/2017  . Hyperlipidemia 09/28/2017  . Atypical chest pain 04/05/2017  . Essential hypertension 04/05/2017  . Hematochezia   . Lower GI bleed 05/22/2016  . Family history of ischemic heart disease 11/18/2012  . Tobacco use disorder 11/18/2012   Past Medical History:  Diagnosis Date  . Allergy   . Anemia   . Angina at rest [facility])   . Arthritis   . Chest pain   . Gastritis   . Gastrointestinal hemorrhage   . GERD (gastroesophageal reflux disease)   . Hyperlipidemia   . Hypertension   . Tobacco abuse    Past Surgical History:  Procedure Laterality Date  . INTRAVASCULAR PRESSURE WIRE/FFR STUDY N/A 09/24/2017   Procedure: INTRAVASCULAR PRESSURE WIRE/FFR STUDY;  Surgeon: Belva Crome, MD;  Location: [place] [facility];  Service: Cardiovascular;  Laterality: N/A;  . LEFT HEART CATH AND CORONARY ANGIOGRAPHY N/A 09/24/2017   Procedure: LEFT HEART CATH AND CORONARY ANGIOGRAPHY;  Surgeon: Belva Crome, MD;  Location: [place] [facility];  Service: Cardiovascular;  Laterality: N/A;  . TOOTH EXTRACTION     No Known Allergies Prior to Admission medications   Medication Sig Start Date End Date Taking? Authorizing Provider  acetaminophen (TYLENOL) 650 MG CR tablet Take 650 mg by mouth every 8 (eight) hours as needed for pain.   Yes [provider]  amLODipine (NORVASC) 10 MG tablet Take 1 tablet (10 mg total) by mouth daily. 10/13/18  Yes Jettie Booze, MD  aspirin EC  81 MG tablet Take 81 mg by mouth daily.   Yes [provider]  atorvastatin (LIPITOR) 80 MG tablet Take 1 tablet (80 mg total) by mouth daily. 11/09/18  Yes Jettie Booze, MD  Bempedoic Acid-Ezetimibe (NEXLIZET) 180-10 MG TABS Take 1 tablet by mouth daily. 02/13/19  Yes Jettie Booze, MD  clopidogrel (PLAVIX) 75 MG tablet Take 1 tablet by mouth once daily 02/14/19  Yes Imogene Burn, PA-C  hydrochlorothiazide (MICROZIDE) 12.5 MG capsule Take 1 capsule by mouth once daily 04/18/19   Yes Wendie Agreste, MD  metoprolol succinate (TOPROL-XL) 25 MG 24 hr tablet TAKE 1 TABLET BY MOUTH ONCE DAILY WITH  OR  IMMEDIATELY  FOLLOWING  A  MEAL 02/20/19  Yes Jettie Booze, MD  Multiple Vitamin (MULTIVITAMIN WITH MINERALS) TABS tablet Take 1 tablet by mouth daily.   Yes [provider]   Social History   Socioeconomic History  . Marital status: Married    Spouse name: Not on file  . Number of children: Not on file  . Years of education: Not on file  . Highest education level: Not on file  Occupational History  . Not on file  Tobacco Use  . Smoking status: Former Smoker    Packs/day: 0.25    Types: Cigarettes    Quit date: 10/18/2017    Years since quitting: 1.5  . Smokeless tobacco: Never Used  Substance and Sexual Activity  . Alcohol use: Yes    Alcohol/week: 6.0 standard drinks    Types: 6 Cans of beer per week  . Drug use: No  . Sexual activity: Yes  Other Topics Concern  . Not on file  Social History Narrative  . Not on file   Social Determinants of Health   Financial Resource Strain:   . Difficulty of Paying Living Expenses:   Food Insecurity:   . Worried About Charity fundraiser in the Last Year:   . Arboriculturist in the Last Year:   Transportation Needs:   . Film/video editor (Medical):   Marland Kitchen Lack of Transportation (Non-Medical):   Physical Activity:   . Days of Exercise per Week:   . Minutes of Exercise per Session:   Stress:   . Feeling of Stress :   Social Connections:   . Frequency of Communication with Friends and Family:   . Frequency of Social Gatherings with Friends and Family:   . Attends Religious Services:   . Active Member of Clubs or Organizations:   . Attends Archivist Meetings:   Marland Kitchen Marital Status:   Intimate Partner Violence:   . Fear of Current or Ex-Partner:   . Emotionally Abused:   Marland Kitchen Physically Abused:   . Sexually Abused:     Review of Systems   Objective:   Vitals:   05/08/19 1354    BP: 130/86  Pulse: 80  Temp: 98.8 F (37.1 C)  TempSrc: Temporal  SpO2: 99%  Weight: 250 lb (113.4 kg)  Height: 6\' 4"  (1.93 m)     Physical Exam Vitals reviewed.  Constitutional:      Appearance: He is well-developed.  HENT:     Head: Normocephalic and atraumatic.  Eyes:     Pupils: Pupils are equal, round, and reactive to light.  Neck:     Vascular: No carotid bruit or JVD.  Cardiovascular:     Rate and Rhythm: Normal rate and regular rhythm.     Heart sounds: Normal  heart sounds. No murmur. No gallop.   Pulmonary:     Effort: Pulmonary effort is normal.     Breath sounds: Normal breath sounds. No rales.  Skin:    General: Skin is warm and dry.  Neurological:     Mental Status: He is alert and oriented to person, place, and time.  Psychiatric:        Mood and Affect: Mood normal.        Behavior: Behavior normal.        Assessment & Plan:  ERIC HEWSON is a 54 y.o. male . Preoperative clearance - Plan: DG Chest 2 View Essential hypertension - Plan: Lipid panel, Comprehensive metabolic panel Screening for diabetes mellitus Screening, anemia, deficiency, iron - Plan: CBC  -Preoperative medical clearance, as reportedly had cardiac clearance completed.  Anticipate clearance but will check labs as above.  Erectile dysfunction, unspecified erectile dysfunction type - Plan: Testosterone, Free, Total, SHBG  -Check testosterone, option of Viagra or Levitra but would recommend he discuss first with his cardiologist.  Not currently on nitrates. Side effects discussed for Viagra or Levitra if we do provide a prescription (including but not limited to headache/flushing, blue discoloration of vision, possible vascular steal and risk of cardiac effects if underlying unknown coronary artery disease, and permanent sensorineural hearing loss). Understanding expressed.   No orders of the defined types were placed in this encounter.  Patient Instructions   I can check  testosterone, but would not recommend taking viagra or levitra until discussed with your cardiologist.    If you have lab work done today you will be contacted with your lab results within the next 2 weeks.  If you have not heard from [place] then please contact us. The fastest way to get your results is to register for My Chart.   IF you received an x-ray today, you will receive an invoice from [facility]. Please contact [facility] at [phone] with questions or concerns regarding your invoice.   IF you received labwork today, you will receive an invoice from [facility]. Please contact [facility] at [phone] with questions or concerns regarding your invoice.   Our billing staff will not be able to assist you with questions regarding bills from these companies.  You will be contacted with the lab results as soon as they are available. The fastest way to get your results is to activate your My Chart account. Instructions are located on the last page of this paperwork. If you have not heard from [place] regarding the results in 2 weeks, please contact this office.         Signed, Merri Ray, MD [facility]

## 2019-05-08 NOTE — Patient Instructions (Addendum)
I can check testosterone, but would not recommend taking viagra or levitra until discussed with your cardiologist.    If you have lab work done today you will be contacted with your lab results within the next 2 weeks.  If you have not heard from [place] then please contact us. The fastest way to get your results is to register for My Chart.   IF you received an x-ray today, you will receive an invoice from [facility]. Please contact [facility] at [phone] with questions or concerns regarding your invoice.   IF you received labwork today, you will receive an invoice from [facility]. Please contact [facility] at [phone] with questions or concerns regarding your invoice.   Our billing staff will not be able to assist you with questions regarding bills from these companies.  You will be contacted with the lab results as soon as they are available. The fastest way to get your results is to activate your My Chart account. Instructions are located on the last page of this paperwork. If you have not heard from [place] regarding the results in 2 weeks, please contact this office.

## 2019-05-10 LAB — CBC
Hematocrit: 41.1 % (ref 37.5–51.0)
Hemoglobin: 14.1 g/dL (ref 13.0–17.7)
MCH: 30.7 pg (ref 26.6–33.0)
MCHC: 34.3 g/dL (ref 31.5–35.7)
MCV: 90 fL (ref 79–97)
Platelets: 358 10*3/uL (ref 150–450)
RBC: 4.59 x10E6/uL (ref 4.14–5.80)
RDW: 12.4 % (ref 11.6–15.4)
WBC: 10.7 10*3/uL (ref 3.4–10.8)

## 2019-05-10 LAB — COMPREHENSIVE METABOLIC PANEL
ALT: 30 IU/L (ref 0–44)
AST: 36 IU/L (ref 0–40)
Albumin/Globulin Ratio: 1.5 (ref 1.2–2.2)
Albumin: 4.5 g/dL (ref 3.8–4.9)
Alkaline Phosphatase: 56 IU/L (ref 39–117)
BUN/Creatinine Ratio: 18 (ref 9–20)
BUN: 17 mg/dL (ref 6–24)
Bilirubin Total: 0.4 mg/dL (ref 0.0–1.2)
CO2: 25 mmol/L (ref 20–29)
Calcium: 9.7 mg/dL (ref 8.7–10.2)
Chloride: 100 mmol/L (ref 96–106)
Creatinine, Ser: 0.93 mg/dL (ref 0.76–1.27)
GFR calc Af Amer: 109 mL/min/{1.73_m2} (ref >59)
GFR calc non Af Amer: 95 mL/min/{1.73_m2} (ref >59)
Globulin, Total: 3.1 g/dL (ref 1.5–4.5)
Glucose: 100 mg/dL — ABNORMAL HIGH (ref 65–99)
Potassium: 4.4 mmol/L (ref 3.5–5.2)
Sodium: 139 mmol/L (ref 134–144)
Total Protein: 7.6 g/dL (ref 6.0–8.5)

## 2019-05-10 LAB — LIPID PANEL
Chol/HDL Ratio: 3.2 ratio (ref 0.0–5.0)
Cholesterol, Total: 155 mg/dL (ref 100–199)
HDL: 48 mg/dL (ref >39)
LDL Chol Calc (NIH): 68 mg/dL (ref 0–99)
Triglycerides: 238 mg/dL — ABNORMAL HIGH (ref 0–149)
VLDL Cholesterol Cal: 39 mg/dL (ref 5–40)

## 2019-05-10 LAB — TESTOSTERONE, FREE, TOTAL, SHBG
Sex Hormone Binding: 32 nmol/L (ref 19.3–76.4)
Testosterone, Free: 5.2 pg/mL — ABNORMAL LOW (ref 7.2–24.0)
Testosterone: 279 ng/dL (ref 264–916)

## 2019-05-15 ENCOUNTER — Other Ambulatory Visit: Payer: 59

## 2019-05-16 ENCOUNTER — Other Ambulatory Visit: Payer: Self-pay | Admitting: Family Medicine

## 2019-05-16 DIAGNOSIS — R7989 Other specified abnormal findings of blood chemistry: Secondary | ICD-10-CM

## 2019-06-09 ENCOUNTER — Ambulatory Visit: Payer: Self-pay | Admitting: Orthopedic Surgery

## 2019-06-09 ENCOUNTER — Other Ambulatory Visit: Payer: Self-pay

## 2019-06-21 ENCOUNTER — Other Ambulatory Visit: Payer: Self-pay | Admitting: Family Medicine

## 2019-06-21 DIAGNOSIS — I10 Essential (primary) hypertension: Secondary | ICD-10-CM

## 2019-06-26 ENCOUNTER — Encounter: Payer: Self-pay | Admitting: Surgery

## 2019-06-26 ENCOUNTER — Ambulatory Visit (INDEPENDENT_AMBULATORY_CARE_PROVIDER_SITE_OTHER): Payer: 59 | Admitting: Surgery

## 2019-06-26 ENCOUNTER — Other Ambulatory Visit: Payer: Self-pay

## 2019-06-26 VITALS — BP 126/83 | HR 77 | Temp 98.2°F | Resp 20 | Ht 76.0 in | Wt 245.0 lb

## 2019-06-26 DIAGNOSIS — M479 Spondylosis, unspecified: Secondary | ICD-10-CM | POA: Diagnosis not present

## 2019-06-26 NOTE — Progress Notes (Signed)
[facility] [place]  Patient name: Lee Krause MRN: [id number] DOB: [birth year] Sex: male   REQUESTING PROVIDER:    Dr. Rolena Infante   REASON FOR CONSULT:    Spine exposure  HISTORY OF PRESENT ILLNESS:   Lee Krause is a 54 y.o. male, who is referred for anterior exposure at L5-S1 for chronic back pain.  Patient states that he has been having difficulty for many many years and has failed nonoperative treatment.  He has not had any prior abdominal surgery.  He is a former smoker.  PAST MEDICAL HISTORY    Past Medical History:  Diagnosis Date  . Allergy   . Anemia   . Angina at rest [facility])   . Arthritis   . Chest pain   . Gastritis   . Gastrointestinal hemorrhage   . GERD (gastroesophageal reflux disease)   . Hyperlipidemia   . Hypertension   . Tobacco abuse      FAMILY HISTORY   Family History  Problem Relation Age of Onset  . Hypertension Father   . Heart attack Brother   . Colon cancer Neg Hx   . Esophageal cancer Neg Hx   . Rectal cancer Neg Hx   . Stomach cancer Neg Hx     SOCIAL HISTORY:   Social History   Socioeconomic History  . Marital status: Married    Spouse name: Not on file  . Number of children: Not on file  . Years of education: Not on file  . Highest education level: Not on file  Occupational History  . Not on file  Tobacco Use  . Smoking status: Former Smoker    Packs/day: 0.25    Types: Cigarettes    Quit date: 10/18/2017    Years since quitting: 1.6  . Smokeless tobacco: Never Used  Vaping Use  . Vaping Use: Never used  Substance and Sexual Activity  . Alcohol use: Yes    Alcohol/week: 6.0 standard drinks    Types: 6 Cans of beer per week  . Drug use: No  . Sexual activity: Yes  Other Topics Concern  . Not on file  Social History Narrative  . Not on file   Social Determinants of Health   Financial Resource Strain:   . Difficulty of Paying Living Expenses:    Food Insecurity:   . Worried About Charity fundraiser in the Last Year:   . Arboriculturist in the Last Year:   Transportation Needs:   . Film/video editor (Medical):   Marland Kitchen Lack of Transportation (Non-Medical):   Physical Activity:   . Days of Exercise per Week:   . Minutes of Exercise per Session:   Stress:   . Feeling of Stress :   Social Connections:   . Frequency of Communication with Friends and Family:   . Frequency of Social Gatherings with Friends and Family:   . Attends Religious Services:   . Active Member of Clubs or Organizations:   . Attends Archivist Meetings:   Marland Kitchen Marital Status:   Intimate Partner Violence:   . Fear of Current or Ex-Partner:   . Emotionally Abused:   Marland Kitchen Physically Abused:   . Sexually Abused:     ALLERGIES:    Allergies  Allergen Reactions  . Nsaids     Internal bleeding    CURRENT MEDICATIONS:    Current Outpatient Medications  Medication Sig Dispense Refill  . acetaminophen (TYLENOL) 650 MG CR  tablet Take 650 mg by mouth every 8 (eight) hours as needed for pain.    Marland Kitchen amLODipine (NORVASC) 10 MG tablet Take 1 tablet (10 mg total) by mouth daily. 90 tablet 3  . amLODipine (NORVASC) 5 MG tablet Take 5 mg by mouth daily.    Marland Kitchen aspirin EC 81 MG tablet Take 81 mg by mouth daily.    Marland Kitchen atorvastatin (LIPITOR) 80 MG tablet Take 1 tablet (80 mg total) by mouth daily. (Patient taking differently: Take 80 mg by mouth daily at 6 PM. ) 90 tablet 3  . Bempedoic Acid-Ezetimibe (NEXLIZET) 180-10 MG TABS Take 1 tablet by mouth daily. 90 tablet 3  . clopidogrel (PLAVIX) 75 MG tablet Take 1 tablet by mouth once daily (Patient taking differently: Take 75 mg by mouth daily. ) 90 tablet 1  . fluticasone (FLONASE) 50 MCG/ACT nasal spray Place 1 spray into both nostrils daily as needed for allergies or rhinitis.    . hydrochlorothiazide (MICROZIDE) 12.5 MG capsule TAKE 1 CAPSULE BY MOUTH ONCE DAILY . APPOINTMENT REQUIRED FOR FUTURE REFILLS 30  capsule 0  . metoprolol succinate (TOPROL-XL) 25 MG 24 hr tablet TAKE 1 TABLET BY MOUTH ONCE DAILY WITH  OR  IMMEDIATELY  FOLLOWING  A  MEAL (Patient taking differently: Take 25 mg by mouth daily. ) 90 tablet 2   No current facility-administered medications for this visit.    REVIEW OF SYSTEMS:   [X]  denotes positive finding, [ ]  denotes negative finding Cardiac  Comments:  Chest pain or chest pressure:    Shortness of breath upon exertion:    Short of breath when lying flat:    Irregular heart rhythm:        Vascular    Pain in calf, thigh, or hip brought on by ambulation:    Pain in feet at night that wakes you up from your sleep:     Blood clot in your veins:    Leg swelling:         Pulmonary    Oxygen at home:    Productive cough:     Wheezing:         Neurologic    Sudden weakness in arms or legs:     Sudden numbness in arms or legs:     Sudden onset of difficulty speaking or slurred speech:    Temporary loss of vision in one eye:     Problems with dizziness:         Gastrointestinal    Blood in stool:      Vomited blood:         Genitourinary    Burning when urinating:     Blood in urine:        Psychiatric    Major depression:         Hematologic    Bleeding problems:    Problems with blood clotting too easily:        Skin    Rashes or ulcers:        Constitutional    Fever or chills:     PHYSICAL EXAM:   There were no vitals filed for this visit.  GENERAL: The patient is a well-nourished male, in no acute distress. The vital signs are documented above. CARDIAC: There is a regular rate and rhythm.  VASCULAR: Palpable pedal pulses. PULMONARY: Nonlabored respirations ABDOMEN: Soft and non-tender with normal pitched bowel sounds.  MUSCULOSKELETAL: There are no major deformities or cyanosis. NEUROLOGIC: No focal weakness or  paresthesias are detected. SKIN: There are no ulcers or rashes noted. PSYCHIATRIC: The patient has a normal  affect.  STUDIES:   I reviewed his prior CT scan which shows that his aortic bifurcation is at the mid L4 level.  ASSESSMENT and PLAN   Degenerative back disease: We discussed my role in the anterior exposure of the L5-S1 disc space.  We discussed the potential risk of injury to the iliac artery and vein as well as the ureter.  Also talked about retrograde ejaculation, wound complications and hernia.  All of his questions were answered and he wishes to proceed.   Leia Alf, MD, FACS [facility] [phone] Pager [phone]

## 2019-06-26 NOTE — H&P (View-Only) (Signed)
[facility]ist of [place]  Patient name: Lee Krause MRN: [id number] DOB: Oct 29, 1965 Sex: male   REQUESTING PROVIDER:    Dr. Rolena Infante   REASON FOR CONSULT:    Spine exposure  HISTORY OF PRESENT ILLNESS:   Lee Krause is a 54 y.o. male, who is referred for anterior exposure at L5-S1 for chronic back pain.  Patient states that he has been having difficulty for many many years and has failed nonoperative treatment.  He has not had any prior abdominal surgery.  He is a former smoker.  PAST MEDICAL HISTORY    Past Medical History:  Diagnosis Date  . Allergy   . Anemia   . Angina at rest [facility])   . Arthritis   . Chest pain   . Gastritis   . Gastrointestinal hemorrhage   . GERD (gastroesophageal reflux disease)   . Hyperlipidemia   . Hypertension   . Tobacco abuse      FAMILY HISTORY   Family History  Problem Relation Age of Onset  . Hypertension Father   . Heart attack Brother   . Colon cancer Neg Hx   . Esophageal cancer Neg Hx   . Rectal cancer Neg Hx   . Stomach cancer Neg Hx     SOCIAL HISTORY:   Social History   Socioeconomic History  . Marital status: Married    Spouse name: Not on file  . Number of children: Not on file  . Years of education: Not on file  . Highest education level: Not on file  Occupational History  . Not on file  Tobacco Use  . Smoking status: Former Smoker    Packs/day: 0.25    Types: Cigarettes    Quit date: 10/18/2017    Years since quitting: 1.6  . Smokeless tobacco: Never Used  Vaping Use  . Vaping Use: Never used  Substance and Sexual Activity  . Alcohol use: Yes    Alcohol/week: 6.0 standard drinks    Types: 6 Cans of beer per week  . Drug use: No  . Sexual activity: Yes  Other Topics Concern  . Not on file  Social History Narrative  . Not on file   Social Determinants of Health   Financial Resource Strain:   . Difficulty of Paying Living Expenses:     Food Insecurity:   . Worried About Charity fundraiser in the Last Year:   . Arboriculturist in the Last Year:   Transportation Needs:   . Film/video editor (Medical):   Marland Kitchen Lack of Transportation (Non-Medical):   Physical Activity:   . Days of Exercise per Week:   . Minutes of Exercise per Session:   Stress:   . Feeling of Stress :   Social Connections:   . Frequency of Communication with Friends and Family:   . Frequency of Social Gatherings with Friends and Family:   . Attends Religious Services:   . Active Member of Clubs or Organizations:   . Attends Archivist Meetings:   Marland Kitchen Marital Status:   Intimate Partner Violence:   . Fear of Current or Ex-Partner:   . Emotionally Abused:   Marland Kitchen Physically Abused:   . Sexually Abused:     ALLERGIES:    Allergies  Allergen Reactions  . Nsaids     Internal bleeding    CURRENT MEDICATIONS:    Current Outpatient Medications  Medication Sig Dispense Refill  . acetaminophen (TYLENOL) 650 MG  CR tablet Take 650 mg by mouth every 8 (eight) hours as needed for pain.    Marland Kitchen amLODipine (NORVASC) 10 MG tablet Take 1 tablet (10 mg total) by mouth daily. 90 tablet 3  . amLODipine (NORVASC) 5 MG tablet Take 5 mg by mouth daily.    Marland Kitchen aspirin EC 81 MG tablet Take 81 mg by mouth daily.    Marland Kitchen atorvastatin (LIPITOR) 80 MG tablet Take 1 tablet (80 mg total) by mouth daily. (Patient taking differently: Take 80 mg by mouth daily at 6 PM. ) 90 tablet 3  . Bempedoic Acid-Ezetimibe (NEXLIZET) 180-10 MG TABS Take 1 tablet by mouth daily. 90 tablet 3  . clopidogrel (PLAVIX) 75 MG tablet Take 1 tablet by mouth once daily (Patient taking differently: Take 75 mg by mouth daily. ) 90 tablet 1  . fluticasone (FLONASE) 50 MCG/ACT nasal spray Place 1 spray into both nostrils daily as needed for allergies or rhinitis.    . hydrochlorothiazide (MICROZIDE) 12.5 MG capsule TAKE 1 CAPSULE BY MOUTH ONCE DAILY . APPOINTMENT REQUIRED FOR FUTURE REFILLS 30  capsule 0  . metoprolol succinate (TOPROL-XL) 25 MG 24 hr tablet TAKE 1 TABLET BY MOUTH ONCE DAILY WITH  OR  IMMEDIATELY  FOLLOWING  A  MEAL (Patient taking differently: Take 25 mg by mouth daily. ) 90 tablet 2   No current facility-administered medications for this visit.    REVIEW OF SYSTEMS:   [X]  denotes positive finding, [ ]  denotes negative finding Cardiac  Comments:  Chest pain or chest pressure:    Shortness of breath upon exertion:    Short of breath when lying flat:    Irregular heart rhythm:        Vascular    Pain in calf, thigh, or hip brought on by ambulation:    Pain in feet at night that wakes you up from your sleep:     Blood clot in your veins:    Leg swelling:         Pulmonary    Oxygen at home:    Productive cough:     Wheezing:         Neurologic    Sudden weakness in arms or legs:     Sudden numbness in arms or legs:     Sudden onset of difficulty speaking or slurred speech:    Temporary loss of vision in one eye:     Problems with dizziness:         Gastrointestinal    Blood in stool:      Vomited blood:         Genitourinary    Burning when urinating:     Blood in urine:        Psychiatric    Major depression:         Hematologic    Bleeding problems:    Problems with blood clotting too easily:        Skin    Rashes or ulcers:        Constitutional    Fever or chills:     PHYSICAL EXAM:   There were no vitals filed for this visit.  GENERAL: The patient is a well-nourished male, in no acute distress. The vital signs are documented above. CARDIAC: There is a regular rate and rhythm.  VASCULAR: Palpable pedal pulses. PULMONARY: Nonlabored respirations ABDOMEN: Soft and non-tender with normal pitched bowel sounds.  MUSCULOSKELETAL: There are no major deformities or cyanosis. NEUROLOGIC: No focal weakness  or paresthesias are detected. SKIN: There are no ulcers or rashes noted. PSYCHIATRIC: The patient has a normal  affect.  STUDIES:   I reviewed his prior CT scan which shows that his aortic bifurcation is at the mid L4 level.  ASSESSMENT and PLAN   Degenerative back disease: We discussed my role in the anterior exposure of the L5-S1 disc space.  We discussed the potential risk of injury to the iliac artery and vein as well as the ureter.  Also talked about retrograde ejaculation, wound complications and hernia.  All of his questions were answered and he wishes to proceed.   Leia Alf, MD, FACS [facility] [phone] Pager [phone]

## 2019-06-27 ENCOUNTER — Ambulatory Visit: Payer: Self-pay | Admitting: Orthopedic Surgery

## 2019-06-27 NOTE — H&P (Signed)
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 Despite conservative care including time, activity modification, formalized physical therapy, and injection therapy the patient continues to have severe pain that is affecting his quality-of-life and therefore he would like to move forward with surgical intervention. He is scheduled for L5-S1 ALIF w/ PSFI on 07/13/19 at [facility] with Dr. Rolena Infante (Dr. Trula Slade for approach)  Patient Active Problem List   Diagnosis Date Noted  . CAD (coronary artery disease) 09/28/2017  . Hyperlipidemia 09/28/2017  . Atypical chest pain 04/05/2017  . Essential hypertension 04/05/2017  . Hematochezia   . Lower GI bleed 05/22/2016  . Family history of ischemic heart disease 11/18/2012  . Tobacco use disorder 11/18/2012   Past Medical History:  Diagnosis Date  . Allergy   . Anemia   . Angina at rest [facility])   . Arthritis   . Chest pain   . Gastritis   . Gastrointestinal hemorrhage   . GERD (gastroesophageal reflux disease)   . Hyperlipidemia   . Hypertension   . Tobacco abuse     Past Surgical History:  Procedure Laterality Date  . INTRAVASCULAR PRESSURE WIRE/FFR STUDY N/A 09/24/2017   Procedure: INTRAVASCULAR PRESSURE WIRE/FFR STUDY;  Surgeon: Belva Crome, MD;  Location: [place] [facility];  Service: Cardiovascular;  Laterality: N/A;  . LEFT HEART CATH AND CORONARY ANGIOGRAPHY N/A 09/24/2017   Procedure: LEFT HEART CATH AND CORONARY ANGIOGRAPHY;  Surgeon: Belva Crome, MD;  Location: [place] [facility];  Service: Cardiovascular;  Laterality: N/A;  . TOOTH EXTRACTION      Current Outpatient Medications  Medication Sig Dispense Refill Last Dose  . acetaminophen (TYLENOL) 650 MG CR tablet Take 650 mg by mouth every 8 (eight) hours as needed for pain.     Marland Kitchen amLODipine (NORVASC) 10 MG tablet Take 1 tablet (10 mg total) by mouth daily. 90 tablet 3   . amLODipine (NORVASC) 5 MG  tablet Take 5 mg by mouth daily.     Marland Kitchen aspirin EC 81 MG tablet Take 81 mg by mouth daily.     Marland Kitchen atorvastatin (LIPITOR) 80 MG tablet Take 1 tablet (80 mg total) by mouth daily. (Patient taking differently: Take 80 mg by mouth daily at 6 PM. ) 90 tablet 3   . Bempedoic Acid-Ezetimibe (NEXLIZET) 180-10 MG TABS Take 1 tablet by mouth daily. 90 tablet 3   . clopidogrel (PLAVIX) 75 MG tablet Take 1 tablet by mouth once daily (Patient taking differently: Take 75 mg by mouth daily. ) 90 tablet 1   . fluticasone (FLONASE) 50 MCG/ACT nasal spray Place 1 spray into both nostrils daily as needed for allergies or rhinitis.     . hydrochlorothiazide (MICROZIDE) 12.5 MG capsule TAKE 1 CAPSULE BY MOUTH ONCE DAILY . APPOINTMENT REQUIRED FOR FUTURE REFILLS 30 capsule 0   . metoprolol succinate (TOPROL-XL) 25 MG 24 hr tablet TAKE 1 TABLET BY MOUTH ONCE DAILY WITH  OR  IMMEDIATELY  FOLLOWING  A  MEAL (Patient taking differently: Take 25 mg by mouth daily. ) 90 tablet 2    No current facility-administered medications for this visit.   Allergies  Allergen Reactions  . Nsaids     Internal bleeding    Social History   Tobacco Use  . Smoking status: Former Smoker    Packs/day: 0.25    Types: Cigarettes    Quit date: 10/18/2017    Years since quitting: 1.6  .  Smokeless tobacco: Never Used  Substance Use Topics  . Alcohol use: Yes    Alcohol/week: 6.0 standard drinks    Types: 6 Cans of beer per week    Family History  Problem Relation Age of Onset  . Hypertension Father   . Heart attack Brother   . Colon cancer Neg Hx   . Esophageal cancer Neg Hx   . Rectal cancer Neg Hx   . Stomach cancer Neg Hx     Review of Systems As stated in HPI  Objective:   Vitals: Ht: 6 ft 4 in 06/27/2019 08:31 am BP: 121/96 sitting L arm 06/27/2019 08:33 am Pulse: 77 bpm 06/27/2019 08:33 am  Clinical exam: Lee Krause is a pleasant individual, who appears younger than their stated age. He Is alert and orientated 3. No  shortness of breath, chest pain. Heart: Regular rate and rhythm, no rubs, murmurs, or gallops Lungs: Clear to auscultation bilaterally Abdomen is soft and non-tender, negative loss of bowel and bladder control, no rebound tenderness. Bowel sounds 4. Negative: skin lesions abrasions contusions Gait pattern: Normal Neuro: 5/5 motor strength throughout the lower extremity. Positive dysesthesias in the left L5 dermatome. Negative Babinski test, negative straight leg raise test. No clonus, 1+ deep tendon reflexes at the knee and Achilles. Musculoskeletal: Significant back pain radiating into the lower extremities with extension, decreased discomfort and pain with forward flexion. No SI joint pain. Left knee brace in place for osteoarthritis of the left knee. But no hip or ankle pain with isolated joint range of motion. X-rays of the lumbar spine demonstrate collapse of the L5-S1 disc space consistent with degenerative lumbar disc disease. No scoliosis or spondylolisthesis. No fracture seen. Significant foraminal stenosis is noted. Lumbar MRI dated 10/11/18: Moderate to severe degenerative disc disease L5-S1 with greater than 50% loss of disc space height. Producing severe left and moderate to severe right foraminal stenosis. There is obvious compression of the exiting L5 nerve root. Mild degenerative disease at L3-4 and L4-5. No fracture is seen. No evidence of abnormal marrow signal change. Updated MRI of the lumbar spine performed at [facility] dated 06/21/2019 show moderate left and mild right L5-S1 neural foraminal narrowing with contact of the exiting left L5 nerve roots which is unchanged compared to previous MRI. There is a mild bulging disc at L4-5 and mild left lateral bulging disc at L3-4 with left foraminal annual fissure without neural foraminal narrowing. Overall this exam is essentially unchanged compared to previous exam.  Assessment:   Lee Krause is a very pleasant 54 year old male He has  ongoing significant back pain with intermittent radicular leg pain. At this point based on his clinical exam I do believe that surgical intervention is warranted. We did get an updated MRI of his lumbar spine which showed no significant change and therefore we will plan to move forward with an L5-S1 anterior lumbar interbody fusion with posterior fusion instrumentation.  Plan:   Risks and benefits of surgery were discussed with the patient. These include: Infection, bleeding, death, stroke, paralysis, ongoing or worse pain, need for additional surgery, nonunion, leak of spinal fluid, adjacent segment degeneration requiring additional fusion surgery, need for posterior decompression and/or fusion. Bleeding from major vessels, and blood clots (deep venous thrombosis)requiring additional treatment. Due to the abdominal contents requiring further intervention, loss in bowel and bladder control.  Additional risk for male patients: Retrograde ejaculation, and therefore infertility.   Risks and benefits of surgery were discussed with the patient. These include: Infection, bleeding, death,  stroke, paralysis, ongoing or worse pain, need for additional surgery, nonunion, leak of spinal fluid, adjacent segment degeneration requiring additional fusion surgery, Injury to abdominal vessels that can require anterior surgery to stop bleeding. Malposition of the cage and/or pedicle screws that could require additional surgery. Loss of bowel and bladder control. Postoperative hematoma causing neurologic compression that could require urgent or emergent re-operation.   We have obtained preoperative medical clearance from the patient's primary care provider As well as the patient's cardiologist.   Patient was evaluated by Dr. Trula Slade today, the vascular approach surgeon, we will await his consult note.  I did review the patient's medication list He is on Plavix as well as aspirin which he will hold 7 days prior to  surgery and restart 48 hours after. This has been approved by his cardiologist and the patient did express an understanding of this. He is not taking any anti-inflammatory medications or over-the-counter vitamins/supplements.  Patient has a physical therapy appointment following this visit to be fitted for his LSO brace  We have also discussed the post-operative recovery period to include: bathing/showering restrictions, wound healing, activity (and driving) restrictions, medications/pain mangement.  We have also discussed post-operative redflags to include: signs and symptoms of postoperative infection, DVT/PE.  All patients questions were invited and answered  Follow-up: 2 weeks postoperatively

## 2019-06-29 NOTE — Pre-Procedure Instructions (Signed)
[place], [place] [address] Phone: [phone] Fax: [phone]    Your procedure is scheduled on Thurs., July 13, 2019 from 7:30AM-12:35PM  Report to [facility] Entrance "A" at 5:30AM  Call this number if you have problems the morning of surgery:  [phone]   Remember:  Do not eat or drink after midnight on June 30th   Take these medicines the morning of surgery with A SIP OF WATER: AmLODipine (NORVASC)     Metoprolol succinate (TOPROL-XL) Bempedoic Acid-Ezetimibe (NEXLIZET)      If Needed: Acetaminophen (TYLENOL)     Fluticasone (FLONASE)  Take last dose of Aspirin on 6/30 Take last dose of Plavix on 6/24 (stop 7 days prior to surgery.)  7 days before surgery stop taking all Aspirin (unless instructed by your doctor) and Other Aspirin containing products, Vitamins, Fish oils, and Herbal medications. Also stop all NSAIDS i.e. Advil, Ibuprofen, Motrin, Aleve, Anaprox, Naproxen, BC, Goody Powders, and all Supplements.  No Smoking of any kind, Tobacco, or Alcohol products 24 hours prior to your procedure. If you use a Cpap at night, you may bring all equipment for your overnight stay.  Morning of Surgery:  Do not wear jewelry.  Do not wear lotions, powders, colognes, or deodorant.  Do not shave 48 hours prior to surgery.  Men may shave face and neck.  Do not bring valuables to the hospital.  [facility] is not responsible for any belongings or valuables.  Contacts, dentures or bridgework may not be worn into surgery.    For patients admitted to the hospital, discharge time will be determined by your treatment team.  Patients discharged the day of surgery will not be allowed to drive home, and someone age 59 and over needs to stay with them for 24 hours.    Special instructions:   [place]- Preparing For Surgery  Before surgery, you can play an important role.  Because skin is not sterile, your skin needs to be as free of germs as possible. You can reduce the number of germs on your skin by washing with CHG (chlorahexidine gluconate) Soap before surgery.  CHG is an antiseptic cleaner which kills germs and bonds with the skin to continue killing germs even after washing.    Oral Hygiene is also important to reduce your risk of infection.  Remember - BRUSH YOUR TEETH THE MORNING OF SURGERY WITH YOUR REGULAR TOOTHPASTE  Please do not use if you have an allergy to CHG or antibacterial soaps. If your skin becomes reddened/irritated stop using the CHG.  Do not shave (including legs and underarms) for at least 48 hours prior to first CHG shower. It is OK to shave your face.  Please follow these instructions carefully.   1. Shower the NIGHT BEFORE SURGERY and the MORNING OF SURGERY with CHG.   2. If you chose to wash your hair, wash your hair first as usual with your normal shampoo.  3. After you shampoo, rinse your hair and body thoroughly to remove the shampoo.  4. Use CHG as you would any other liquid soap. You can apply CHG directly to the skin and wash gently with a scrungie or a clean washcloth.   5. Apply the CHG Soap to your body ONLY FROM THE NECK DOWN.  Do not use on open wounds or open sores. Avoid contact with your eyes, ears, mouth and genitals (private parts). Wash Face and  genitals (private parts)  with your normal soap.  6. Wash thoroughly, paying special attention to the area where your surgery will be performed.  7. Thoroughly rinse your body with warm water from the neck down.  8. DO NOT shower/wash with your normal soap after using and rinsing off the CHG Soap.  9. Pat yourself dry with a CLEAN TOWEL.  10. Wear CLEAN PAJAMAS to bed the night before surgery, wear comfortable clothes the morning of surgery  11. Place CLEAN SHEETS on your bed the night of your first shower and DO NOT SLEEP WITH PETS.   Day of Surgery:  Do not  apply any deodorants/lotions.  Please wear clean clothes to the hospital/surgery center.   Remember to brush your teeth WITH YOUR REGULAR TOOTHPASTE.  Please read over the following fact sheets that you were given.

## 2019-06-30 ENCOUNTER — Other Ambulatory Visit: Payer: Self-pay

## 2019-06-30 ENCOUNTER — Encounter (HOSPITAL_COMMUNITY)
Admission: RE | Admit: 2019-06-30 | Discharge: 2019-06-30 | Disposition: A | Discharge status: Home / Self Care | Payer: 59 | Source: Ambulatory Visit | Attending: Orthopedic Surgery | Admitting: Orthopedic Surgery

## 2019-06-30 ENCOUNTER — Ambulatory Visit (HOSPITAL_COMMUNITY)
Admission: RE | Admit: 2019-06-30 | Discharge: 2019-06-30 | Disposition: A | Discharge status: Home / Self Care | Payer: 59 | Source: Ambulatory Visit | Attending: Orthopedic Surgery | Admitting: Orthopedic Surgery

## 2019-06-30 ENCOUNTER — Encounter (HOSPITAL_COMMUNITY): Payer: Self-pay

## 2019-06-30 DIAGNOSIS — Z01818 Encounter for other preprocedural examination: Secondary | ICD-10-CM | POA: Diagnosis present

## 2019-06-30 LAB — SURGICAL PCR SCREEN
MRSA, PCR: NEGATIVE
Staphylococcus aureus: POSITIVE — AB

## 2019-06-30 LAB — URINALYSIS, ROUTINE W REFLEX MICROSCOPIC
Bilirubin Urine: NEGATIVE
Glucose, UA: NEGATIVE mg/dL
Hgb urine dipstick: NEGATIVE
Ketones, ur: NEGATIVE mg/dL
Leukocytes,Ua: NEGATIVE
Nitrite: NEGATIVE
Protein, ur: NEGATIVE mg/dL
Specific Gravity, Urine: 1.019 (ref 1.005–1.030)
pH: 5 (ref 5.0–8.0)

## 2019-06-30 LAB — BASIC METABOLIC PANEL
Anion gap: 12 (ref 5–15)
BUN: 16 mg/dL (ref 6–20)
CO2: 25 mmol/L (ref 22–32)
Calcium: 9.3 mg/dL (ref 8.9–10.3)
Chloride: 100 mmol/L (ref 98–111)
Creatinine, Ser: 1.04 mg/dL (ref 0.61–1.24)
GFR calc Af Amer: >60 mL/min (ref >60)
GFR calc non Af Amer: >60 mL/min (ref >60)
Glucose, Bld: 160 mg/dL — ABNORMAL HIGH (ref 70–99)
Potassium: 3.8 mmol/L (ref 3.5–5.1)
Sodium: 137 mmol/L (ref 135–145)

## 2019-06-30 LAB — CBC
HCT: 40.7 % (ref 39.0–52.0)
Hemoglobin: 14.1 g/dL (ref 13.0–17.0)
MCH: 30.1 pg (ref 26.0–34.0)
MCHC: 34.6 g/dL (ref 30.0–36.0)
MCV: 87 fL (ref 80.0–100.0)
Platelets: 339 10*3/uL (ref 150–400)
RBC: 4.68 MIL/uL (ref 4.22–5.81)
RDW: 11.4 % — ABNORMAL LOW (ref 11.5–15.5)
WBC: 9.1 10*3/uL (ref 4.0–10.5)
nRBC: 0 % (ref 0.0–0.2)

## 2019-06-30 LAB — TYPE AND SCREEN
ABO/RH(D): B NEG
Antibody Screen: NEGATIVE

## 2019-06-30 LAB — PROTIME-INR
INR: 1 (ref 0.8–1.2)
Prothrombin Time: 13.1 seconds (ref 11.4–15.2)

## 2019-06-30 LAB — ABO/RH: ABO/RH(D): B NEG

## 2019-06-30 LAB — APTT: aPTT: 28 seconds (ref 24–36)

## 2019-06-30 NOTE — Progress Notes (Addendum)
PCP: Dr. Carlota Raspberry Cardiologist: Dr. Irish Lack  EKG: 10/08/2018 CXR: 05/08/19 ECHO: 03/2017 Stress Test: 03/2017 Cardiac Cath: 09/2017  BP elevated at appt 134/95, had not yet taken AM BP meds. Will take after appt  Pt aware of when to STOP ASA and PLAVIX--7 days prior to surgery  Per [place] with anesthesia, no need to see during appt, will forward chart to anesthesia per Dr. Rolena Infante' order  Patient denies shortness of breath, fever, cough, and chest pain at PAT appointment.  Patient verbalized understanding of instructions provided today at the PAT appointment.  Patient asked to review instructions at home and day of surgery.

## 2019-06-30 NOTE — Pre-Procedure Instructions (Signed)
[place], [place] [address] Phone: [phone] Fax: [phone]    Your procedure is scheduled on Thurs., July 13, 2019 from 7:30AM-12:35PM  Report to [facility] Entrance "A" at 5:30AM  Call this number if you have problems the morning of surgery:  [phone]   Remember:  Do not eat or drink after midnight on June 30th   Take these medicines the morning of surgery with A SIP OF WATER: AmLODipine (NORVASC)     Metoprolol succinate (TOPROL-XL) Bempedoic Acid-Ezetimibe (NEXLIZET)      If Needed: Acetaminophen (TYLENOL)     Fluticasone (FLONASE)  STOP your clopidogrel (Plavix) and Aspirin 7 days prior to your surgery per your Doctor's instructions  7 days before surgery stop taking all Aspirin containing products, Vitamins, Fish oils, and Herbal medications. Also stop all NSAIDS i.e. Advil, Ibuprofen, Motrin, Aleve, Anaprox, Naproxen, BC, Goody Powders, and all Supplements.  No Smoking of any kind, Tobacco, or Alcohol products 24 hours prior to your procedure. If you use a Cpap at night, you may bring all equipment for your overnight stay.  Morning of Surgery:  Do not wear jewelry.  Do not wear lotions, powders, colognes, or deodorant.  Do not shave 48 hours prior to surgery.  Men may shave face and neck.  Do not bring valuables to the hospital.  [facility] is not responsible for any belongings or valuables.  Contacts, dentures or bridgework may not be worn into surgery.    For patients admitted to the hospital, discharge time will be determined by your treatment team.  Patients discharged the day of surgery will not be allowed to drive home, and someone age 32 and over needs to stay with them for 24 hours.    Special instructions:   [place]- Preparing For Surgery  Before surgery, you can play an important role. Because skin is not sterile, your skin needs to be  as free of germs as possible. You can reduce the number of germs on your skin by washing with CHG (chlorahexidine gluconate) Soap before surgery.  CHG is an antiseptic cleaner which kills germs and bonds with the skin to continue killing germs even after washing.    Oral Hygiene is also important to reduce your risk of infection.  Remember - BRUSH YOUR TEETH THE MORNING OF SURGERY WITH YOUR REGULAR TOOTHPASTE  Please do not use if you have an allergy to CHG or antibacterial soaps. If your skin becomes reddened/irritated stop using the CHG.  Do not shave (including legs and underarms) for at least 48 hours prior to first CHG shower. It is OK to shave your face.  Please follow these instructions carefully.   1. Shower the NIGHT BEFORE SURGERY and the MORNING OF SURGERY with CHG.   2. If you chose to wash your hair, wash your hair first as usual with your normal shampoo.  3. After you shampoo, rinse your hair and body thoroughly to remove the shampoo.  4. Use CHG as you would any other liquid soap. You can apply CHG directly to the skin and wash gently with a scrungie or a clean washcloth.   5. Apply the CHG Soap to your body ONLY FROM THE NECK DOWN.  Do not use on open wounds or open sores. Avoid contact with your eyes, ears, mouth and genitals (private parts). Wash Face and genitals (private parts)  with your normal soap.  6. Wash thoroughly,  paying special attention to the area where your surgery will be performed.  7. Thoroughly rinse your body with warm water from the neck down.  8. DO NOT shower/wash with your normal soap after using and rinsing off the CHG Soap.  9. Pat yourself dry with a CLEAN TOWEL.  10. Wear CLEAN PAJAMAS to bed the night before surgery, wear comfortable clothes the morning of surgery  11. Place CLEAN SHEETS on your bed the night of your first shower and DO NOT SLEEP WITH PETS.   Day of Surgery:  Do not apply any deodorants/lotions.  Please wear clean  clothes to the hospital/surgery center.   Remember to brush your teeth WITH YOUR REGULAR TOOTHPASTE.  Please read over the following fact sheets that you were given.

## 2019-07-03 NOTE — Progress Notes (Signed)
Anesthesia Chart Review:   Case: [id number] Date/Time: 07/13/19 0715   Procedures:      ANTERIOR LUMBAR FUSION (ALIF) L5-S1 WITH POSTERIOR SPINAL FUSION INTERBODY L5-S1 (N/A ) - 5 hrs Dr. 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 Irish Lack documented no angina at last visit 10/25/18), HTN, anemia.  Quit smoking 10/18/2017.  Has cardiac clearance for surgery at acceptable risk by Jory Sims, NP 05/03/19  - Patient to stop ASA and Plavix 7 days before surgery   VS: BP (!) 133/91   Pulse 93   Temp 36.9 C (Oral)   Ht 6\' 4"  (1.93 m)   Wt 110.9 kg   SpO2 100%   BMI 29.76 kg/m    PROVIDERS: - PCP is Wendie Agreste, MD. Last office visit 05/08/19 - Cardiologist is Larae Grooms, MD who is aware of upcoming surgery; last virtual office visit 10/25/18. Cleared for surgery at acceptable risk by Jory Sims, NP   LABS: Labs reviewed: Acceptable for surgery. (all labs ordered are listed, but only abnormal results are displayed)  Labs Reviewed  SURGICAL PCR SCREEN - Abnormal; Notable for the following components:      Result Value   Staphylococcus aureus POSITIVE (*)    All other components within normal limits  BASIC METABOLIC PANEL - Abnormal; Notable for the following components:   Glucose, Bld 160 (*)    All other components within normal limits  CBC - Abnormal; Notable for the following components:   RDW 11.4 (*)    All other components within normal limits  APTT  PROTIME-INR  URINALYSIS, ROUTINE W REFLEX MICROSCOPIC  TYPE AND SCREEN  ABO/RH    IMAGES:  CXR 06/30/19:  - Mild left base subsegmental atelectasis versus scarring again noted. No new abnormality  identified   EKG 09/28/18: NSR.  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  The artery is widely patent and dominant.  Normal left main.  Circumflex coronary artery is widely patent but there is ostial 50% narrowing in the first obtuse marginal.  Eccentric 50% stenosis in proximal to mid LAD after the large first diagonal.  Because of the eccentricity and current clinical presentation, FFR was performed and demonstrated that the lesion was not hemodynamically significant with a value of 0.89.  Normal LV function.  Normal LVEDP.  Estimated EF 55%. RECOMMENDATIONS:  Aggressive risk factor modification: High intensity statin therapy to reduce LDL less than 70; blood pressure less than 130/80 mmHg; smoking cessation; moderate intensity aerobic activity greater than or equal to 150 minutes/week; and, close monitoring of glycemic control.  Seems it would be safe to discontinue long-acting nitrates since they are not helping and causing headache of significance.  Consider adding Plavix 75 mg/day given family history and other risk factors, until risk preventative measures are fully cooperative.   Echo 04/05/17:  - Left ventricle: The cavity size was normal. There was mild focal basal hypertrophy of the septum. Systolic function was normal. The estimated ejection fraction was in  the range of 55% to 60%. Wall motion was normal; there were no regional wall motion abnormalities. Left ventricular diastolic function parameters were normal.    Past Medical History:  Diagnosis Date  . Allergy   . Anemia   . Angina at rest [facility])   . Arthritis   . Chest pain   . Gastritis   . Gastrointestinal hemorrhage   . GERD (gastroesophageal reflux disease)   . Hyperlipidemia   . Hypertension   . Tobacco abuse     Past Surgical History:  Procedure Laterality Date   . CARDIAC CATHETERIZATION    . INTRAVASCULAR PRESSURE WIRE/FFR STUDY N/A 09/24/2017   Procedure: INTRAVASCULAR PRESSURE WIRE/FFR STUDY;  Surgeon: Belva Crome, MD;  Location: [place] [facility];  Service: Cardiovascular;  Laterality: N/A;  . LEFT HEART CATH AND CORONARY ANGIOGRAPHY N/A 09/24/2017   Procedure: LEFT HEART CATH AND CORONARY ANGIOGRAPHY;  Surgeon: Belva Crome, MD;  Location: [place] [facility];  Service: Cardiovascular;  Laterality: N/A;  . TOOTH EXTRACTION      MEDICATIONS: . acetaminophen (TYLENOL) 650 MG CR tablet  . amLODipine (NORVASC) 10 MG tablet  . amLODipine (NORVASC) 5 MG tablet  . aspirin EC 81 MG tablet  . atorvastatin (LIPITOR) 80 MG tablet  . Bempedoic Acid-Ezetimibe (NEXLIZET) 180-10 MG TABS  . clopidogrel (PLAVIX) 75 MG tablet  . fluticasone (FLONASE) 50 MCG/ACT nasal spray  . hydrochlorothiazide (MICROZIDE) 12.5 MG capsule  . metoprolol succinate (TOPROL-XL) 25 MG 24 hr tablet   No current facility-administered medications for this encounter.   - Patient to stop ASA and Plavix 7 days before surgery   If no changes, I anticipate pt can proceed with surgery as scheduled.   Willeen Cass, FNP-BC [facility] Short Stay Surgical Center/Anesthesiology Phone: [phone] 07/03/2019 12:02 PM

## 2019-07-03 NOTE — Anesthesia Preprocedure Evaluation (Addendum)
Anesthesia Evaluation  Patient identified by MRN, date of birth, ID band Patient awake    Reviewed: Allergy & Precautions, NPO status , Patient's Chart, lab work & pertinent test results, reviewed documented beta blocker date and time   Airway Mallampati: II  TM Distance: >3 FB Neck ROM: Full    Dental no notable dental hx. (+) Dental Advisory Given, Teeth Intact   Pulmonary former smoker,    Pulmonary exam normal breath sounds clear to auscultation       Cardiovascular hypertension, Pt. on medications and Pt. 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  The artery is widely patent and dominant.  Normal left main.  Circumflex coronary artery is widely patent but there is ostial 50% narrowing in the first obtuse marginal.  Eccentric 50% stenosis in proximal to mid LAD after the large first diagonal.  Because of the eccentricity and current clinical presentation, FFR was performed and demonstrated that the lesion was not hemodynamically significant with a value of 0.89.  Normal LV function.  Normal LVEDP.  Estimated EF 55%.  RECOMMENDATIONS:   Aggressive risk factor modification: High intensity statin therapy to reduce LDL less than 70; blood pressure less than 130/80 mmHg; smoking cessation; moderate intensity aerobic activity greater than or equal to 150 minutes/week; and, close monitoring of glycemic control.  Seems it would be safe to discontinue long-acting nitrates since they are not helping and causing headache of significance.  Consider adding Plavix 75 mg/day given family history and other risk factors, until risk preventative measures are fully cooperative.     Neuro/Psych negative neurological ROS     GI/Hepatic Neg liver ROS, GERD  ,  Endo/Other  negative  endocrine ROS  Renal/GU negative Renal ROS     Musculoskeletal  (+) Arthritis ,   Abdominal (+) + obese,   Peds  Hematology negative hematology ROS (+) anemia ,   Anesthesia Other Findings   Reproductive/Obstetrics                          Anesthesia Physical Anesthesia Plan  ASA: III  Anesthesia Plan: General   Post-op Pain Management: GA combined w/ Regional for post-op pain   Induction: Intravenous  PONV Risk Score and Plan: 3 and Midazolam, Ondansetron and Dexamethasone  Airway Management Planned: Oral ETT  Additional Equipment: None  Intra-op Plan:   Post-operative Plan: Extubation in OR  Informed Consent: I have reviewed the patients History and Physical, chart, labs and discussed the procedure including the risks, benefits and alternatives for the proposed anesthesia with the patient or authorized representative who has indicated his/her understanding and acceptance.     Dental advisory given  Plan Discussed with: CRNA  Anesthesia Plan Comments: (See APP note by Durel Salts, FNP)     Anesthesia Quick Evaluation

## 2019-07-05 ENCOUNTER — Other Ambulatory Visit (HOSPITAL_COMMUNITY): Payer: 59

## 2019-07-10 ENCOUNTER — Other Ambulatory Visit (HOSPITAL_COMMUNITY)
Admission: RE | Admit: 2019-07-10 | Discharge: 2019-07-10 | Disposition: A | Discharge status: Home / Self Care | Payer: 59 | Source: Ambulatory Visit | Attending: Orthopedic Surgery | Admitting: Orthopedic Surgery

## 2019-07-10 LAB — SARS CORONAVIRUS 2 (TAT 6-24 HRS): SARS Coronavirus 2: NEGATIVE

## 2019-07-12 ENCOUNTER — Encounter (HOSPITAL_COMMUNITY): Payer: Self-pay | Admitting: Orthopedic Surgery

## 2019-07-13 ENCOUNTER — Inpatient Hospital Stay (HOSPITAL_COMMUNITY): Payer: 59

## 2019-07-13 ENCOUNTER — Inpatient Hospital Stay (HOSPITAL_COMMUNITY): Payer: 59 | Admitting: Physician Assistant

## 2019-07-13 ENCOUNTER — Inpatient Hospital Stay (HOSPITAL_COMMUNITY): Payer: 59 | Admitting: Anesthesiology

## 2019-07-13 ENCOUNTER — Inpatient Hospital Stay (HOSPITAL_COMMUNITY)
Admission: RE | Admit: 2019-07-13 | Discharge: 2019-07-14 | DRG: 455 | Disposition: A | Discharge status: Home / Self Care | Payer: 59 | Attending: Orthopedic Surgery | Admitting: Orthopedic Surgery

## 2019-07-13 ENCOUNTER — Encounter (HOSPITAL_COMMUNITY)
Admission: RE | Disposition: A | Discharge status: Home / Self Care | Payer: Self-pay | Source: Home / Self Care | Attending: Orthopedic Surgery

## 2019-07-13 ENCOUNTER — Encounter (HOSPITAL_COMMUNITY): Payer: Self-pay | Admitting: Orthopedic Surgery

## 2019-07-13 ENCOUNTER — Other Ambulatory Visit: Payer: Self-pay

## 2019-07-13 DIAGNOSIS — M5116 Intervertebral disc disorders with radiculopathy, lumbar region: Secondary | ICD-10-CM | POA: Diagnosis present

## 2019-07-13 DIAGNOSIS — M5117 Intervertebral disc disorders with radiculopathy, lumbosacral region: Principal | ICD-10-CM | POA: Diagnosis present

## 2019-07-13 DIAGNOSIS — I2510 Atherosclerotic heart disease of native coronary artery without angina pectoris: Secondary | ICD-10-CM | POA: Diagnosis present

## 2019-07-13 DIAGNOSIS — Z7902 Long term (current) use of antithrombotics/antiplatelets: Secondary | ICD-10-CM | POA: Diagnosis not present

## 2019-07-13 DIAGNOSIS — Z20822 Contact with and (suspected) exposure to covid-19: Secondary | ICD-10-CM | POA: Diagnosis present

## 2019-07-13 DIAGNOSIS — K219 Gastro-esophageal reflux disease without esophagitis: Secondary | ICD-10-CM | POA: Diagnosis present

## 2019-07-13 DIAGNOSIS — Z87891 Personal history of nicotine dependence: Secondary | ICD-10-CM

## 2019-07-13 DIAGNOSIS — Z419 Encounter for procedure for purposes other than remedying health state, unspecified: Secondary | ICD-10-CM

## 2019-07-13 DIAGNOSIS — M2578 Osteophyte, vertebrae: Secondary | ICD-10-CM | POA: Diagnosis present

## 2019-07-13 DIAGNOSIS — Z6829 Body mass index (BMI) 29.0-29.9, adult: Secondary | ICD-10-CM | POA: Diagnosis not present

## 2019-07-13 DIAGNOSIS — I10 Essential (primary) hypertension: Secondary | ICD-10-CM | POA: Diagnosis present

## 2019-07-13 DIAGNOSIS — Z886 Allergy status to analgesic agent status: Secondary | ICD-10-CM

## 2019-07-13 DIAGNOSIS — Z981 Arthrodesis status: Secondary | ICD-10-CM

## 2019-07-13 DIAGNOSIS — E785 Hyperlipidemia, unspecified: Secondary | ICD-10-CM | POA: Diagnosis present

## 2019-07-13 DIAGNOSIS — Z79899 Other long term (current) drug therapy: Secondary | ICD-10-CM | POA: Diagnosis not present

## 2019-07-13 DIAGNOSIS — Z8249 Family history of ischemic heart disease and other diseases of the circulatory system: Secondary | ICD-10-CM | POA: Diagnosis not present

## 2019-07-13 DIAGNOSIS — G8929 Other chronic pain: Secondary | ICD-10-CM | POA: Diagnosis present

## 2019-07-13 DIAGNOSIS — Z7982 Long term (current) use of aspirin: Secondary | ICD-10-CM | POA: Diagnosis not present

## 2019-07-13 DIAGNOSIS — M4807 Spinal stenosis, lumbosacral region: Secondary | ICD-10-CM | POA: Diagnosis present

## 2019-07-13 DIAGNOSIS — M1990 Unspecified osteoarthritis, unspecified site: Secondary | ICD-10-CM | POA: Diagnosis present

## 2019-07-13 DIAGNOSIS — E669 Obesity, unspecified: Secondary | ICD-10-CM | POA: Diagnosis present

## 2019-07-13 DIAGNOSIS — M5417 Radiculopathy, lumbosacral region: Secondary | ICD-10-CM

## 2019-07-13 DIAGNOSIS — Z01810 Encounter for preprocedural cardiovascular examination: Secondary | ICD-10-CM | POA: Diagnosis not present

## 2019-07-13 HISTORY — PX: ANTERIOR LUMBAR FUSION: SHX1170

## 2019-07-13 HISTORY — PX: HARVEST BONE GRAFT: SHX377

## 2019-07-13 HISTORY — PX: ABDOMINAL EXPOSURE: SHX5708

## 2019-07-13 SURGERY — ANTERIOR LUMBAR FUSION 1 LEVEL
Anesthesia: General | Site: Spine Lumbar

## 2019-07-13 MED ORDER — ENOXAPARIN (LOVENOX) PATIENT EDUCATION KIT
PACK | Freq: Once | Status: AC
  Filled 2019-07-13: qty 1

## 2019-07-13 MED ORDER — MEPERIDINE HCL 25 MG/ML IJ SOLN: 6.2500 mg | INTRAMUSCULAR | Status: DC | PRN

## 2019-07-13 MED ORDER — MAGNESIUM CITRATE PO SOLN
1.0000 | Freq: Once | ORAL | Status: AC
  Administered 2019-07-13: 1 via ORAL
  Filled 2019-07-13: qty 296

## 2019-07-13 MED ORDER — ORAL CARE MOUTH RINSE: 15.0000 mL | Freq: Once | OROMUCOSAL | Status: AC

## 2019-07-13 MED ORDER — CHLORHEXIDINE GLUCONATE 4 % EX LIQD: 60.0000 mL | Freq: Once | CUTANEOUS | Status: DC

## 2019-07-13 MED ORDER — FENTANYL CITRATE (PF) 250 MCG/5ML IJ SOLN
INTRAMUSCULAR | Status: AC
  Filled 2019-07-13: qty 5

## 2019-07-13 MED ORDER — LIDOCAINE HCL (CARDIAC) PF 100 MG/5ML IV SOSY
PREFILLED_SYRINGE | INTRAVENOUS | Status: DC | PRN
  Administered 2019-07-13: 100 mg via INTRAVENOUS

## 2019-07-13 MED ORDER — CEFAZOLIN SODIUM-DEXTROSE 1-4 GM/50ML-% IV SOLN
1.0000 g | Freq: Three times a day (TID) | INTRAVENOUS | Status: AC
  Administered 2019-07-13 (×2): 1 g via INTRAVENOUS
  Filled 2019-07-13 (×2): qty 50

## 2019-07-13 MED ORDER — DEXAMETHASONE SODIUM PHOSPHATE 10 MG/ML IJ SOLN
INTRAMUSCULAR | Status: DC | PRN
  Administered 2019-07-13: 10 mg via INTRAVENOUS

## 2019-07-13 MED ORDER — MENTHOL 3 MG MT LOZG: 1.0000 | LOZENGE | OROMUCOSAL | Status: DC | PRN

## 2019-07-13 MED ORDER — LACTATED RINGERS IV SOLN: INTRAVENOUS | Status: DC

## 2019-07-13 MED ORDER — METHOCARBAMOL 1000 MG/10ML IJ SOLN
500.0000 mg | Freq: Four times a day (QID) | INTRAVENOUS | Status: DC | PRN
  Filled 2019-07-13: qty 5

## 2019-07-13 MED ORDER — LACTATED RINGERS IV SOLN: INTRAVENOUS | Status: DC | PRN

## 2019-07-13 MED ORDER — MORPHINE SULFATE (PF) 2 MG/ML IV SOLN: 2.0000 mg | INTRAVENOUS | Status: DC | PRN

## 2019-07-13 MED ORDER — ONDANSETRON HCL 4 MG PO TABS: 4.0000 mg | ORAL_TABLET | Freq: Four times a day (QID) | ORAL | Status: DC | PRN

## 2019-07-13 MED ORDER — MIDAZOLAM HCL 2 MG/2ML IJ SOLN
INTRAMUSCULAR | Status: AC
  Filled 2019-07-13: qty 2

## 2019-07-13 MED ORDER — SODIUM CHLORIDE 0.9% FLUSH
3.0000 mL | Freq: Two times a day (BID) | INTRAVENOUS | Status: DC
  Administered 2019-07-13: 3 mL via INTRAVENOUS

## 2019-07-13 MED ORDER — FENTANYL CITRATE (PF) 250 MCG/5ML IJ SOLN
INTRAMUSCULAR | Status: DC | PRN
  Administered 2019-07-13: 75 ug via INTRAVENOUS
  Administered 2019-07-13 (×2): 50 ug via INTRAVENOUS
  Administered 2019-07-13: 75 ug via INTRAVENOUS

## 2019-07-13 MED ORDER — OXYCODONE HCL 5 MG PO TABS
10.0000 mg | ORAL_TABLET | ORAL | Status: DC | PRN
  Administered 2019-07-13 – 2019-07-14 (×6): 10 mg via ORAL
  Filled 2019-07-13 (×6): qty 2

## 2019-07-13 MED ORDER — ONDANSETRON HCL 4 MG/2ML IJ SOLN: 4.0000 mg | Freq: Four times a day (QID) | INTRAMUSCULAR | Status: DC | PRN

## 2019-07-13 MED ORDER — BUPIVACAINE LIPOSOME 1.3 % IJ SUSP
INTRAMUSCULAR | Status: DC | PRN
  Administered 2019-07-13: 10 mL

## 2019-07-13 MED ORDER — ACETAMINOPHEN 650 MG RE SUPP: 650.0000 mg | RECTAL | Status: DC | PRN

## 2019-07-13 MED ORDER — PROPOFOL 10 MG/ML IV BOLUS
INTRAVENOUS | Status: AC
  Filled 2019-07-13: qty 40

## 2019-07-13 MED ORDER — PHENYLEPHRINE 40 MCG/ML (10ML) SYRINGE FOR IV PUSH (FOR BLOOD PRESSURE SUPPORT)
PREFILLED_SYRINGE | INTRAVENOUS | Status: DC | PRN
  Administered 2019-07-13: 80 ug via INTRAVENOUS

## 2019-07-13 MED ORDER — ROCURONIUM BROMIDE 10 MG/ML (PF) SYRINGE
PREFILLED_SYRINGE | INTRAVENOUS | Status: DC | PRN
  Administered 2019-07-13: 60 mg via INTRAVENOUS
  Administered 2019-07-13: 20 mg via INTRAVENOUS
  Administered 2019-07-13: 10 mg via INTRAVENOUS
  Administered 2019-07-13: 40 mg via INTRAVENOUS
  Administered 2019-07-13: 10 mg via INTRAVENOUS

## 2019-07-13 MED ORDER — ONDANSETRON HCL 4 MG/2ML IJ SOLN
INTRAMUSCULAR | Status: AC
  Filled 2019-07-13: qty 2

## 2019-07-13 MED ORDER — CHLORHEXIDINE GLUCONATE 0.12 % MT SOLN: 15.0000 mL | Freq: Once | OROMUCOSAL | Status: AC

## 2019-07-13 MED ORDER — EPHEDRINE SULFATE-NACL 50-0.9 MG/10ML-% IV SOSY
PREFILLED_SYRINGE | INTRAVENOUS | Status: DC | PRN
  Administered 2019-07-13: 10 mg via INTRAVENOUS

## 2019-07-13 MED ORDER — BUPIVACAINE-EPINEPHRINE 0.25% -1:200000 IJ SOLN
INTRAMUSCULAR | Status: DC | PRN
  Administered 2019-07-13: 10 mL
  Administered 2019-07-13: 5 mL

## 2019-07-13 MED ORDER — HYDROMORPHONE HCL 1 MG/ML IJ SOLN
0.2500 mg | INTRAMUSCULAR | Status: DC | PRN
  Administered 2019-07-13 (×2): 0.5 mg via INTRAVENOUS

## 2019-07-13 MED ORDER — BUPIVACAINE HCL (PF) 0.5 % IJ SOLN
INTRAMUSCULAR | Status: DC | PRN
  Administered 2019-07-13: 10 mL

## 2019-07-13 MED ORDER — CEFAZOLIN SODIUM-DEXTROSE 2-4 GM/100ML-% IV SOLN
INTRAVENOUS | Status: AC
  Filled 2019-07-13: qty 100

## 2019-07-13 MED ORDER — HEMOSTATIC AGENTS (NO CHARGE) OPTIME
TOPICAL | Status: DC | PRN
  Administered 2019-07-13 (×2): 1 via TOPICAL

## 2019-07-13 MED ORDER — 0.9 % SODIUM CHLORIDE (POUR BTL) OPTIME
TOPICAL | Status: DC | PRN
  Administered 2019-07-13 (×2): 1000 mL

## 2019-07-13 MED ORDER — SODIUM CHLORIDE 0.9% FLUSH: 3.0000 mL | INTRAVENOUS | Status: DC | PRN

## 2019-07-13 MED ORDER — ATORVASTATIN CALCIUM 80 MG PO TABS
80.0000 mg | ORAL_TABLET | Freq: Every day | ORAL | Status: DC
  Filled 2019-07-13: qty 1

## 2019-07-13 MED ORDER — TRANEXAMIC ACID-NACL 1000-0.7 MG/100ML-% IV SOLN
INTRAVENOUS | Status: AC
  Filled 2019-07-13: qty 100

## 2019-07-13 MED ORDER — EPHEDRINE 5 MG/ML INJ
INTRAVENOUS | Status: AC
  Filled 2019-07-13: qty 10

## 2019-07-13 MED ORDER — BEMPEDOIC ACID-EZETIMIBE 180-10 MG PO TABS: 1.0000 | ORAL_TABLET | Freq: Every day | ORAL | Status: DC

## 2019-07-13 MED ORDER — POLYETHYLENE GLYCOL 3350 17 G PO PACK: 17.0000 g | PACK | Freq: Every day | ORAL | Status: DC | PRN

## 2019-07-13 MED ORDER — THROMBIN 20000 UNITS EX SOLR
CUTANEOUS | Status: DC | PRN
  Administered 2019-07-13: 20 mL via TOPICAL

## 2019-07-13 MED ORDER — METHOCARBAMOL 500 MG PO TABS: 500.0000 mg | ORAL_TABLET | Freq: Three times a day (TID) | ORAL | 0 refills | Status: AC | PRN

## 2019-07-13 MED ORDER — BUPIVACAINE LIPOSOME 1.3 % IJ SUSP
INTRAMUSCULAR | Status: DC | PRN
  Administered 2019-07-13: 5 mL

## 2019-07-13 MED ORDER — OXYCODONE HCL 5 MG PO TABS: 5.0000 mg | ORAL_TABLET | ORAL | Status: DC | PRN

## 2019-07-13 MED ORDER — SUGAMMADEX SODIUM 200 MG/2ML IV SOLN
INTRAVENOUS | Status: DC | PRN
  Administered 2019-07-13: 200 mg via INTRAVENOUS

## 2019-07-13 MED ORDER — LACTATED RINGERS IV SOLN
INTRAVENOUS | Status: DC | PRN
Start: 2019-07-13 — End: 2019-07-13

## 2019-07-13 MED ORDER — CEFAZOLIN SODIUM-DEXTROSE 2-4 GM/100ML-% IV SOLN
2.0000 g | INTRAVENOUS | Status: AC
  Administered 2019-07-13 (×2): 2 g via INTRAVENOUS

## 2019-07-13 MED ORDER — PROPOFOL 10 MG/ML IV BOLUS
INTRAVENOUS | Status: DC | PRN
  Administered 2019-07-13: 250 mg via INTRAVENOUS

## 2019-07-13 MED ORDER — PROPOFOL 1000 MG/100ML IV EMUL
INTRAVENOUS | Status: AC
  Filled 2019-07-13: qty 300

## 2019-07-13 MED ORDER — GABAPENTIN 300 MG PO CAPS
300.0000 mg | ORAL_CAPSULE | Freq: Three times a day (TID) | ORAL | Status: DC
  Administered 2019-07-13 – 2019-07-14 (×3): 300 mg via ORAL
  Filled 2019-07-13 (×3): qty 1

## 2019-07-13 MED ORDER — HYDROMORPHONE HCL 1 MG/ML IJ SOLN
INTRAMUSCULAR | Status: AC
  Filled 2019-07-13: qty 1

## 2019-07-13 MED ORDER — BUPIVACAINE-EPINEPHRINE (PF) 0.25% -1:200000 IJ SOLN
INTRAMUSCULAR | Status: AC
  Filled 2019-07-13: qty 30

## 2019-07-13 MED ORDER — MIDAZOLAM HCL 2 MG/2ML IJ SOLN
INTRAMUSCULAR | Status: DC | PRN
  Administered 2019-07-13: 2 mg via INTRAVENOUS

## 2019-07-13 MED ORDER — PHENOL 1.4 % MT LIQD: 1.0000 | OROMUCOSAL | Status: DC | PRN

## 2019-07-13 MED ORDER — PROMETHAZINE HCL 25 MG/ML IJ SOLN: 6.2500 mg | INTRAMUSCULAR | Status: DC | PRN

## 2019-07-13 MED ORDER — THROMBIN 20000 UNITS EX SOLR
CUTANEOUS | Status: AC
  Filled 2019-07-13: qty 20000

## 2019-07-13 MED ORDER — FLEET ENEMA 7-19 GM/118ML RE ENEM: 1.0000 | ENEMA | Freq: Once | RECTAL | Status: DC | PRN

## 2019-07-13 MED ORDER — METHOCARBAMOL 500 MG PO TABS
500.0000 mg | ORAL_TABLET | Freq: Four times a day (QID) | ORAL | Status: DC | PRN
  Administered 2019-07-13 – 2019-07-14 (×3): 500 mg via ORAL
  Filled 2019-07-13 (×3): qty 1

## 2019-07-13 MED ORDER — PROPOFOL 500 MG/50ML IV EMUL
INTRAVENOUS | Status: DC | PRN
Start: 2019-07-13 — End: 2019-07-13
  Administered 2019-07-13 (×2): 75 ug/kg/min via INTRAVENOUS

## 2019-07-13 MED ORDER — PHENYLEPHRINE HCL-NACL 10-0.9 MG/250ML-% IV SOLN
INTRAVENOUS | Status: DC | PRN
  Administered 2019-07-13: 25 ug/min via INTRAVENOUS
  Administered 2019-07-13: 40 ug/min via INTRAVENOUS

## 2019-07-13 MED ORDER — METOPROLOL SUCCINATE ER 25 MG PO TB24
25.0000 mg | ORAL_TABLET | Freq: Every day | ORAL | Status: DC
  Administered 2019-07-14: 25 mg via ORAL
  Filled 2019-07-13: qty 1

## 2019-07-13 MED ORDER — ROCURONIUM BROMIDE 10 MG/ML (PF) SYRINGE
PREFILLED_SYRINGE | INTRAVENOUS | Status: AC
  Filled 2019-07-13: qty 10

## 2019-07-13 MED ORDER — ENOXAPARIN SODIUM 40 MG/0.4ML ~~LOC~~ SOLN
40.0000 mg | SUBCUTANEOUS | Status: DC
  Administered 2019-07-14: 40 mg via SUBCUTANEOUS
  Filled 2019-07-13: qty 0.4

## 2019-07-13 MED ORDER — AMLODIPINE BESYLATE 5 MG PO TABS
5.0000 mg | ORAL_TABLET | Freq: Every day | ORAL | Status: DC
  Administered 2019-07-14: 5 mg via ORAL
  Filled 2019-07-13: qty 1

## 2019-07-13 MED ORDER — OXYCODONE-ACETAMINOPHEN 10-325 MG PO TABS: 1.0000 | ORAL_TABLET | Freq: Four times a day (QID) | ORAL | 0 refills | Status: AC | PRN

## 2019-07-13 MED ORDER — ACETAMINOPHEN 325 MG PO TABS: 650.0000 mg | ORAL_TABLET | ORAL | Status: DC | PRN

## 2019-07-13 MED ORDER — ONDANSETRON HCL 4 MG/2ML IJ SOLN
INTRAMUSCULAR | Status: DC | PRN
  Administered 2019-07-13: 4 mg via INTRAVENOUS

## 2019-07-13 MED ORDER — DOCUSATE SODIUM 100 MG PO CAPS
100.0000 mg | ORAL_CAPSULE | Freq: Two times a day (BID) | ORAL | Status: DC
  Administered 2019-07-13 – 2019-07-14 (×2): 100 mg via ORAL
  Filled 2019-07-13 (×2): qty 1

## 2019-07-13 MED ORDER — ONDANSETRON HCL 4 MG PO TABS: 4.0000 mg | ORAL_TABLET | Freq: Three times a day (TID) | ORAL | 0 refills | Status: AC | PRN

## 2019-07-13 MED ORDER — CHLORHEXIDINE GLUCONATE 0.12 % MT SOLN
OROMUCOSAL | Status: AC
  Administered 2019-07-13: 15 mL via OROMUCOSAL
  Filled 2019-07-13: qty 15

## 2019-07-13 MED ORDER — LIDOCAINE 2% (20 MG/ML) 5 ML SYRINGE
INTRAMUSCULAR | Status: AC
  Filled 2019-07-13: qty 5

## 2019-07-13 MED ORDER — TRANEXAMIC ACID-NACL 1000-0.7 MG/100ML-% IV SOLN
INTRAVENOUS | Status: DC | PRN
Start: 2019-07-13 — End: 2019-07-13
  Administered 2019-07-13: 1000 mg via INTRAVENOUS

## 2019-07-13 MED ORDER — CEFAZOLIN SODIUM 1 G IJ SOLR
INTRAMUSCULAR | Status: AC
  Filled 2019-07-13: qty 20

## 2019-07-13 SURGICAL SUPPLY — 116 items
APPLIER CLIP 11 MED OPEN (CLIP) ×5
BLADE CLIPPER SURG (BLADE) IMPLANT
BLADE SURG 10 STRL SS (BLADE) ×5 IMPLANT
BUR EGG ELITE 4.0 (BURR) IMPLANT
CABLE BIPOLOR RESECTION CORD (MISCELLANEOUS) ×5 IMPLANT
CATH FOLEY 2WAY SLVR  5CC 16FR (CATHETERS) ×5
CATH FOLEY 2WAY SLVR 5CC 16FR (CATHETERS) ×4 IMPLANT
CLIP APPLIE 11 MED OPEN (CLIP) ×4 IMPLANT
CLIP NEUROVISION LG (CLIP) ×5 IMPLANT
CLIP VESOCCLUDE MED 24/CT (CLIP) IMPLANT
CLIP VESOCCLUDE SM WIDE 24/CT (CLIP) ×5 IMPLANT
CLSR STERI-STRIP ANTIMIC 1/2X4 (GAUZE/BANDAGES/DRESSINGS) ×10 IMPLANT
COVER BACK TABLE 60X90IN (DRAPES) IMPLANT
COVER SURGICAL LIGHT HANDLE (MISCELLANEOUS) ×15 IMPLANT
COVER WAND RF STERILE (DRAPES) IMPLANT
DERMABOND ADVANCED (GAUZE/BANDAGES/DRESSINGS) ×1
DERMABOND ADVANCED .7 DNX12 (GAUZE/BANDAGES/DRESSINGS) ×4 IMPLANT
DEVICE ENDSKLTN IMPLN 14X7D LG (Spine Construct) ×4 IMPLANT
DRAPE C-ARM 42X72 X-RAY (DRAPES) ×10 IMPLANT
DRAPE C-ARMOR (DRAPES) ×10 IMPLANT
DRAPE INCISE IOBAN 66X45 STRL (DRAPES) ×5 IMPLANT
DRAPE LAPAROTOMY T 102X78X121 (DRAPES) ×5 IMPLANT
DRAPE POUCH INSTRU U-SHP 10X18 (DRAPES) ×10 IMPLANT
DRAPE SURG 17X23 STRL (DRAPES) ×10 IMPLANT
DRAPE U-SHAPE 47X51 STRL (DRAPES) ×10 IMPLANT
DRSG OPSITE POSTOP 3X4 (GAUZE/BANDAGES/DRESSINGS) ×5 IMPLANT
DRSG OPSITE POSTOP 4X6 (GAUZE/BANDAGES/DRESSINGS) ×10 IMPLANT
DRSG OPSITE POSTOP 4X8 (GAUZE/BANDAGES/DRESSINGS) ×5 IMPLANT
DURAPREP 26ML APPLICATOR (WOUND CARE) ×10 IMPLANT
ELECT BLADE 4.0 EZ CLEAN MEGAD (MISCELLANEOUS) ×5
ELECT BLADE 6.5 EXT (BLADE) ×5 IMPLANT
ELECT CAUTERY BLADE 6.4 (BLADE) ×5 IMPLANT
ELECT PENCIL ROCKER SW 15FT (MISCELLANEOUS) ×10 IMPLANT
ELECT REM PT RETURN 9FT ADLT (ELECTROSURGICAL) ×5
ELECTRODE BLDE 4.0 EZ CLN MEGD (MISCELLANEOUS) ×4 IMPLANT
ELECTRODE REM PT RTRN 9FT ADLT (ELECTROSURGICAL) ×4 IMPLANT
ENDOSKELETON IMPLANT 14X7D LG (Spine Construct) ×5 IMPLANT
GAUZE 4X4 16PLY RFD (DISPOSABLE) ×5 IMPLANT
GLOVE BIO SURGEON STRL SZ 6.5 (GLOVE) ×25 IMPLANT
GLOVE BIO SURGEON STRL SZ7.5 (GLOVE) ×10 IMPLANT
GLOVE BIOGEL PI IND STRL 6.5 (GLOVE) ×16 IMPLANT
GLOVE BIOGEL PI IND STRL 7.5 (GLOVE) ×4 IMPLANT
GLOVE BIOGEL PI IND STRL 8.5 (GLOVE) ×8 IMPLANT
GLOVE BIOGEL PI INDICATOR 6.5 (GLOVE) ×4
GLOVE BIOGEL PI INDICATOR 7.5 (GLOVE) ×1
GLOVE BIOGEL PI INDICATOR 8.5 (GLOVE) ×2
GLOVE SS BIOGEL STRL SZ 8.5 (GLOVE) ×12 IMPLANT
GLOVE SUPERSENSE BIOGEL SZ 8.5 (GLOVE) ×3
GLOVE SURG SS PI 7.5 STRL IVOR (GLOVE) IMPLANT
GOWN STRL REUS W/ TWL LRG LVL3 (GOWN DISPOSABLE) ×12 IMPLANT
GOWN STRL REUS W/ TWL XL LVL3 (GOWN DISPOSABLE) ×4 IMPLANT
GOWN STRL REUS W/TWL 2XL LVL3 (GOWN DISPOSABLE) ×15 IMPLANT
GOWN STRL REUS W/TWL LRG LVL3 (GOWN DISPOSABLE) ×12
GOWN STRL REUS W/TWL XL LVL3 (GOWN DISPOSABLE) ×4
GUIDEWIRE NITINOL BEVEL TIP (WIRE) ×5 IMPLANT
HEMOSTAT SNOW SURGICEL 2X4 (HEMOSTASIS) IMPLANT
INSERT FOGARTY 61MM (MISCELLANEOUS) IMPLANT
INSERT FOGARTY SM (MISCELLANEOUS) IMPLANT
KIT BASIN OR (CUSTOM PROCEDURE TRAY) ×5 IMPLANT
KIT HARVESTER BONE 10 DISP (KITS) ×5 IMPLANT
KIT TURNOVER KIT B (KITS) ×10 IMPLANT
LOOP VESSEL MAXI BLUE (MISCELLANEOUS) IMPLANT
LOOP VESSEL MINI RED (MISCELLANEOUS) IMPLANT
MODULE EMG NEEDLE SSEP NVM5 (NEEDLE) ×5 IMPLANT
MODULE NVM5 NEXT GEN EMG (NEEDLE) ×5 IMPLANT
NEEDLE SPNL 18GX3.5 QUINCKE PK (NEEDLE) ×5 IMPLANT
NS IRRIG 1000ML POUR BTL (IV SOLUTION) ×5 IMPLANT
PACK LAMINECTOMY ORTHO (CUSTOM PROCEDURE TRAY) ×10 IMPLANT
PACK UNIVERSAL I (CUSTOM PROCEDURE TRAY) ×10 IMPLANT
PAD ARMBOARD 7.5X6 YLW CONV (MISCELLANEOUS) ×20 IMPLANT
PROBE BALL TIP NVM5 SNG USE (BALLOONS) ×5 IMPLANT
PUTTY BONE DBX 5CC MIX (Putty) ×5 IMPLANT
ROD RELINE MAS LORD 5.5X45MM (Rod) ×5 IMPLANT
ROD RELINE MAS LORD 5.5X50 (Rod) ×5 IMPLANT
SCREW 6.5X25 (Screw) ×5 IMPLANT
SCREW LOCK RELINE 5.5 TULIP (Screw) ×20 IMPLANT
SCREW RED MAS POLY 7.5X40MM (Screw) ×10 IMPLANT
SCREW RELINE RED 6.5X45MM POLY (Screw) ×10 IMPLANT
SPONGE INTESTINAL PEANUT (DISPOSABLE) ×30 IMPLANT
SPONGE LAP 18X18 RF (DISPOSABLE) IMPLANT
SPONGE LAP 4X18 RFD (DISPOSABLE) IMPLANT
SPONGE SURGIFOAM ABS GEL 100 (HEMOSTASIS) ×5 IMPLANT
STAPLER VISISTAT 35W (STAPLE) ×5 IMPLANT
SURGIFLO W/THROMBIN 8M KIT (HEMOSTASIS) ×10 IMPLANT
SUT BONE WAX W31G (SUTURE) ×5 IMPLANT
SUT MNCRL AB 3-0 PS2 27 (SUTURE) ×10 IMPLANT
SUT MNCRL AB 4-0 PS2 18 (SUTURE) ×5 IMPLANT
SUT PDS AB 1 CTX 36 (SUTURE) ×10 IMPLANT
SUT PROLENE 4 0 RB 1 (SUTURE)
SUT PROLENE 4-0 RB1 .5 CRCL 36 (SUTURE) IMPLANT
SUT PROLENE 5 0 C 1 24 (SUTURE) IMPLANT
SUT PROLENE 5 0 CC1 (SUTURE) IMPLANT
SUT PROLENE 6 0 C 1 30 (SUTURE) ×5 IMPLANT
SUT PROLENE 6 0 CC (SUTURE) IMPLANT
SUT SILK 0 TIES 10X30 (SUTURE) IMPLANT
SUT SILK 2 0 TIES 10X30 (SUTURE) ×5 IMPLANT
SUT SILK 2 0SH CR/8 30 (SUTURE) IMPLANT
SUT SILK 3 0 TIES 10X30 (SUTURE) ×5 IMPLANT
SUT SILK 3 0SH CR/8 30 (SUTURE) IMPLANT
SUT VIC AB 0 CT1 27 (SUTURE) ×5
SUT VIC AB 0 CT1 27XBRD ANBCTR (SUTURE) ×4 IMPLANT
SUT VIC AB 1 CT1 18XCR BRD 8 (SUTURE) ×4 IMPLANT
SUT VIC AB 1 CT1 27 (SUTURE) ×4
SUT VIC AB 1 CT1 27XBRD ANBCTR (SUTURE) ×4 IMPLANT
SUT VIC AB 1 CT1 8-18 (SUTURE) ×5
SUT VIC AB 2-0 CT1 18 (SUTURE) ×15 IMPLANT
SUT VIC AB 2-0 CT1 27 (SUTURE) ×5
SUT VIC AB 2-0 CT1 TAPERPNT 27 (SUTURE) ×4 IMPLANT
SUT VIC AB 3-0 SH 27 (SUTURE)
SUT VIC AB 3-0 SH 27X BRD (SUTURE) IMPLANT
SUT VICRYL 0 UR6 27IN ABS (SUTURE) ×5 IMPLANT
SYR BULB IRRIG 60ML STRL (SYRINGE) ×5 IMPLANT
TOWEL GREEN STERILE (TOWEL DISPOSABLE) ×10 IMPLANT
TOWEL GREEN STERILE FF (TOWEL DISPOSABLE) ×5 IMPLANT
TRAP SPECIMEN MUCUS 40CC (MISCELLANEOUS) IMPLANT
WATER STERILE IRR 1000ML POUR (IV SOLUTION) IMPLANT

## 2019-07-13 NOTE — H&P (Signed)
Addendum H&P: Lee Krause is a very pleasant 54 year old gentleman with ongoing severe back buttock and radicular left leg pain.  Despite appropriate conservative management his quality of life is continued to deteriorate.  Imaging studies demonstrated advanced degenerative disc disease with severe foraminal stenosis at L5-S1 producing compression to the exiting L5 nerve root as well as a dorsal root ganglion.  Clinical exam consistent with discogenic back pain with L5 radicular leg pain left worse than the right.  As result of the failure of conservative management we have elected to move forward with surgery.  I reviewed the risks benefits and alternatives and all of his questions were addressed.  There has been no change in his clinical exam since his last office visit of 06/27/2019.

## 2019-07-13 NOTE — Anesthesia Procedure Notes (Signed)
Anesthesia Regional Block: TAP block   Pre-Anesthetic Checklist: ,, timeout performed, Correct Patient, Correct Site, Correct Laterality, Correct Procedure, Correct Position, site marked, Risks and benefits discussed,  Surgical consent,  Pre-op evaluation,  At surgeon's request and post-op pain management  Laterality: Left  Prep: chloraprep       Needles:  Injection technique: Single-shot     Needle Length: 4cm  Needle Gauge: 22     Additional Needles:   Procedures:,,,, ultrasound used (permanent image in chart),,,,  Narrative:  Start time: 07/13/2019 7:17 AM End time: 07/13/2019 7:22 AM Injection made incrementally with aspirations every 5 mL.  Performed by: Personally   Additional Notes: Tolerated well. Quite difficult as patient belly breathing and obstructing. Good facial spread noted.

## 2019-07-13 NOTE — Anesthesia Procedure Notes (Signed)
Procedure Name: Intubation Date/Time: 07/13/2019 7:43 AM Performed by: Raenette Rover, CRNA Pre-anesthesia Checklist: Patient identified, Emergency Drugs available, Suction available and Patient being monitored Patient Re-evaluated:Patient Re-evaluated prior to induction Oxygen Delivery Method: Circle system utilized Preoxygenation: Pre-oxygenation with 100% oxygen Induction Type: IV induction Ventilation: Mask ventilation without difficulty and Oral airway inserted - appropriate to patient size Laryngoscope Size: Mac and 4 Grade View: Grade I Tube type: Oral Tube size: 8.0 mm Number of attempts: 1 Airway Equipment and Method: Stylet Placement Confirmation: ETT inserted through vocal cords under direct vision,  positive ETCO2 and breath sounds checked- equal and bilateral Secured at: 23 cm Tube secured with: Tape Dental Injury: Teeth and Oropharynx as per pre-operative assessment  Comments: Easy mask ventilation with 36m OAW in place. Attempted intubation by medic, with no success. DL x1 by CRNA with Grade 1 view; no trauma noted.

## 2019-07-13 NOTE — Discharge Instructions (Signed)
  Spinal Fusion, Adult, Care After This sheet gives you information about how to care for yourself after your procedure. Your doctor may also give you more specific instructions. If you have problems or questions, contact your doctor. Follow these instructions at home: Medicines Take over-the-counter and prescription medicines only as told by your doctor. These include any medicines for pain or blood-thinning medicines (anticoagulants). If you were prescribed an antibiotic medicine, take it as told by your doctor. Do not stop taking the antibiotic even if you start to feel better. Do not drive for 24 hours if you were given a medicine to help you relax (sedative) during your procedure. Do not drive or use heavy machinery while taking prescription pain medicine. If you have a brace: Wear the brace as told by your doctor. Take it off only as told by your doctor. Keep the brace clean. Managing pain, stiffness, and swelling If directed, put ice on the surgery area: If you have a removable brace, take it off as told by your doctor. Put ice in a plastic bag. Place a towel between your skin and the bag. Leave the ice on for 20 minutes, 2-3 times a day. Surgery cut care    Follow instructions from your doctor about how to take care of your cut from surgery (incision). Make sure you: Wash your hands with soap and water before you change your bandage (dressing). If you cannot use soap and water, use hand sanitizer. Change your bandage as told by your doctor. Leave stitches (sutures), skin glue, or skin tape (adhesive) strips in place. They may need to stay in place for 2 weeks or longer. If tape strips get loose and curl up, you may trim the loose edges. Do not remove tape strips completely unless your doctor says it is okay. Keep your cut from surgery clean and dry. Do not take baths, swim, or use a hot tub until your doctor says it is okay. Ask your doctor if you can take showers. You may only  be allowed to take sponge baths. Every day, check your cut from surgery and the area around it for: More redness, swelling, or pain. Fluid or blood. Warmth. Pus or a bad smell. If you have a drain tube, follow instructions from your doctor about caring for it. Do not take out the drain tube or any bandages unless your doctor says it is okay. Physical activity Rest and protect your back as much as possible. Follow instructions from your doctor about how to move. Use good posture to help your spine heal. Do not lift anything that is heavier than 8 lb (3.6 kg), or the limit that you are told, until your doctor says that it is safe. Do not twist or bend at the waist until your doctor says it is okay. It is best if you: Do not make pushing and pulling motions. Do not sit or lie down in the same position for a long time. Do not raise your hands or arms above your head. Return to your normal activities as told by your doctor. Ask your doctor what activities are safe for you. Rest and protect your back as much as you can. Do not start to exercise until your doctor says it is okay. Ask your doctor what kinds of exercise you can do to make your back stronger. Ok to shower in 5 days.  Do not take a bath or submerge the wound General instructions To prevent blood clots and lessen swelling   in your legs: Wear compression stockings as told. Walk one or more times every few hours as told by your doctor. Do not use any products that contain nicotine or tobacco, such as cigarettes and e-cigarettes. These can delay bone healing. If you need help quitting, ask your doctor. To prevent or treat constipation while you are taking prescription pain medicine, your doctor may suggest that you: Drink enough fluid to keep your pee (urine) pale yellow. Take over-the-counter or prescription medicines. Eat foods that are high in fiber. These include fresh fruits and vegetables, whole grains, and beans. Limit foods that  are high in fat and processed sugars, such as fried and sweet foods. Keep all follow-up visits as told by your doctor. This is important. Contact a doctor if: Your pain gets worse. Your medicine does not help your pain. Your legs or feet get painful or swollen. Your cut from surgery is more red, swollen, or painful. Your cut from surgery feels warm to the touch. You have: Fluid or blood coming from your cut from surgery. Pus or a bad smell coming from your cut from surgery. A fever. Weakness or loss of feeling (numbness) in your legs that is new or getting worse. Trouble controlling when you pee (urinate) or poop (have a bowel movement). You feel sick to your stomach (nauseous). You throw up (vomit). Get help right away if: Your pain is very bad. You have chest pain. You have trouble breathing. You start to have a cough. These symptoms may be an emergency. Do not wait to see if the symptoms will go away. Get medical help right away. Call your local emergency services (911 in the [place] Do not drive yourself to the hospital. Summary After the procedure, it is common to have pain in your back and pain by your surgery cut(s). Icing and pain medicines may help to control the pain. Follow directions from your doctor. Rest and protect your back as much as possible. Do not twist or bend at the waist. Get up and walk one or more times every few hours as told by your doctor. This information is not intended to replace advice given to you by your health care provider. Make sure you discuss any questions you have with your health care provider.  -signs and symptoms of a blood clot such as chest pain; shortness of breath; pain, swelling, or warmth in the leg -signs and symptoms of a stroke such as changes in vision; confusion; trouble speaking or understanding; severe headaches; sudden numbness or weakness of the face, arm or leg; trouble walking; dizziness; loss of coordination Side effects that  usually do not require medical attention (report to your doctor or health care professional if they continue or are bothersome): -hair loss -pain, redness, or irritation at site where injected This list may not describe all possible side effects. Call your doctor for medical advice about side effects. You may report side effects to FDA at 1-800-FDA-1088. Where should I keep my medicine? Keep out of the reach of children. Store at room temperature between 15 and 30 degrees C (59 and 86 degrees F). Do not freeze. If your injections have been specially prepared, you may need to store them in the refrigerator. Ask your pharmacist. Throw away any unused medicine after the expiration date. NOTE: This sheet is a summary. It may not cover all possible information. If you have questions about this medicine, talk to your doctor, pharmacist, or health care provider.       F). Do not freeze. If your injections have been specially prepared, you may need to store them in the refrigerator. Ask your pharmacist. Throw away any unused medicine after the expiration date. NOTE: This sheet is a summary. It may not cover all possible information. If you have questions about this medicine, talk to your doctor, pharmacist, or health care provider.  Enoxaparin injection What is this medicine? ENOXAPARIN (ee nox a PA rin) is used after knee, hip, or abdominal surgeries to prevent blood clotting. It is also used to treat existing blood clots in the lungs or in the veins. This medicine may be used for other purposes; ask your health care provider or pharmacist if you have questions. COMMON BRAND NAME(S): Lovenox What should I tell my health care provider before I take this medicine? They need to know if you have any of these conditions: -bleeding disorders, hemorrhage, or hemophilia -infection of the heart or heart valves -kidney or liver disease -previous stroke -prosthetic heart valve -recent surgery or delivery of a baby -ulcer in the stomach or intestine, diverticulitis, or other bowel disease -an unusual or allergic reaction to enoxaparin, heparin, pork or pork products, other medicines, foods, dyes, or preservatives -pregnant or trying to get  pregnant -breast-feeding How should I use this medicine? This medicine is for injection under the skin. It is usually given by a health-care professional. You or a family member may be trained on how to give the injections. If you are to give yourself injections, make sure you understand how to use the syringe, measure the dose if necessary, and give the injection. To avoid bruising, do not rub the site where this medicine has been injected. Do not take your medicine more often than directed. Do not stop taking except on the advice of your doctor or health care professional. Make sure you receive a puncture-resistant container to dispose of the needles and syringes once you have finished with them. Do not reuse these items. Return the container to your doctor or health care professional for proper disposal. Talk to your pediatrician regarding the use of this medicine in children. Special care may be needed. Overdosage: If you think you have taken too much of this medicine contact a poison control center or emergency room at once. NOTE: This medicine is only for you. Do not share this medicine with others. What if I miss a dose? If you miss a dose, take it as soon as you can. If it is almost time for your next dose, take only that dose. Do not take double or extra doses. What may interact with this medicine? -aspirin and aspirin-like medicines -certain medicines that treat or prevent blood clots -dipyridamole -NSAIDs, medicines for pain and inflammation, like ibuprofen or naproxen This list may not describe all possible interactions. Give your health care provider a list of all the medicines, herbs, non-prescription drugs, or dietary supplements you use. Also tell them if you smoke, drink alcohol, or use illegal drugs. Some items may interact with your medicine. What should I watch for while using this medicine? Visit your healthcare professional for regular checks on your progress. You may need  blood work done while you are taking this medicine. Your condition will be monitored carefully while you are receiving this medicine. It is important not to miss any appointments. If you are going to need surgery or other procedure, tell your healthcare professional that you are using this medicine. Using this medicine for a long time may weaken your bones  and increase the risk of bone fractures. Avoid sports and activities that might cause injury while you are using this medicine. Severe falls or injuries can cause unseen bleeding. Be careful when using sharp tools or knives. Consider using an Copy. Take special care brushing or flossing your teeth. Report any injuries, bruising, or red spots on the skin to your healthcare professional. Wear a medical ID bracelet or chain. Carry a card that describes your disease and details of your medicine and dosage times. What side effects may I notice from receiving this medicine? Side effects that you should report to your doctor or health care professional as soon as possible: -allergic reactions like skin rash, itching or hives, swelling of the face, lips, or tongue -bone pain -signs and symptoms of bleeding such as bloody or black, tarry stools; red or dark-brown urine; spitting up blood or brown material that looks like coffee grounds; red spots on the skin; unusual bruising or bleeding from the eye, gums, or nose -signs and symptoms of a blood clot such as chest pain; shortness of breath; pain, swelling, or warmth in the leg -signs and symptoms of a stroke such as changes in vision; confusion; trouble speaking or understanding; severe headaches; sudden numbness or weakness of the face, arm or leg; trouble walking; dizziness; loss of coordination Side effects that usually do not require medical attention (report to your doctor or health care professional if they continue or are bothersome): -hair loss -pain, redness, or irritation at site where  injected This list may not describe all possible side effects. Call your doctor for medical advice about side effects. You may report side effects to FDA at 1-800-FDA-1088. Where should I keep my medicine? Keep out of the reach of children. Store at room temperature between 15 and 30 degrees C (59 and 86 degrees F). Do not freeze. If your injections have been specially prepared, you may need to store them in the refrigerator. Ask your pharmacist. Throw away any unused medicine after the expiration date. NOTE: This sheet is a summary. It may not cover all possible information. If you have questions about this medicine, talk to your doctor, pharmacist, or health care provider.   Restart Plavix on Saturday 07/15/19

## 2019-07-13 NOTE — Interval H&P Note (Signed)
History and Physical Interval Note:  07/13/2019 7:28 AM  Lee Krause  has presented today for surgery, with the diagnosis of Degenerative disc disease with radiculopathy.  The various methods of treatment have been discussed with the patient and family. After consideration of risks, benefits and other options for treatment, the patient has consented to  Procedure(s) with comments: ANTERIOR LUMBAR FUSION (ALIF) L5-S1 WITH POSTERIOR SPINAL FUSION INTERBODY L5-S1 (N/A) - 5 hrs Dr. Trula Slade to do apprach Left sided tap block with exparel ABDOMINAL EXPOSURE (N/A) as a surgical intervention.  The patient's history has been reviewed, patient examined, no change in status, stable for surgery.  I have reviewed the patient's chart and labs.  Questions were answered to the patient's satisfaction.     Annamarie Major

## 2019-07-13 NOTE — Brief Op Note (Signed)
07/13/2019  1:07 PM  PATIENT:  Lee Krause  54 y.o. male  PRE-OPERATIVE DIAGNOSIS:  Degenerative disc disease with radiculopathy  POST-OPERATIVE DIAGNOSIS:  Degenerative disc disease with radiculopathy  PROCEDURE:  Procedure(s): ANTERIOR LUMBAR FUSION (ALIF) L5-S1 (N/A) ABDOMINAL EXPOSURE (N/A) POSTERIOR LUMBAR FUSION LUMBAR FIVE-SACRAL ONE HARVEST ILIAC BONE GRAFT (Left)  SURGEON:  Surgeon(s) and Role: Panel 1:    Melina Schools, MD - Primary Panel 2:    * Serafina Mitchell, MD - Primary  PHYSICIAN ASSISTANT:   ASSISTANTS: Patricia Pesa, PA   ANESTHESIA:   general  EBL:  150 mL   BLOOD ADMINISTERED:none  DRAINS: none   LOCAL MEDICATIONS USED:  MARCAINE    and OTHER exparel  SPECIMEN:  No Specimen  DISPOSITION OF SPECIMEN:  N/A  COUNTS:  YES  TOURNIQUET:  * No tourniquets in log *  DICTATION: .Dragon Dictation  PLAN OF CARE: Admit to inpatient   PATIENT DISPOSITION:  PACU - hemodynamically stable.

## 2019-07-13 NOTE — Transfer of Care (Signed)
Immediate Anesthesia Transfer of Care Note  Patient: Lee Krause  Procedure(s) Performed: ANTERIOR LUMBAR FUSION (ALIF) L5-S1 (N/A Abdomen) POSTERIOR LUMBAR FUSION LUMBAR FIVE-SACRAL ONE (Spine Lumbar) HARVEST ILIAC BONE GRAFT (Left Back) ABDOMINAL EXPOSURE (N/A )  Patient Location: PACU  Anesthesia Type:GA combined with regional for post-op pain  Level of Consciousness: awake and patient cooperative  Airway & Oxygen Therapy: Patient Spontanous Breathing and Patient connected to nasal cannula oxygen  Post-op Assessment: Report given to RN and Post -op Vital signs reviewed and stable  Post vital signs: Reviewed and stable  Last Vitals:  Vitals Value Taken Time  BP 146/90 07/13/19 1334  Temp    Pulse 95 07/13/19 1336  Resp 19 07/13/19 1336  SpO2 99 % 07/13/19 1336  Vitals shown include unvalidated device data.  Last Pain:  Vitals:   07/13/19 0613  PainSc: 4       Patients Stated Pain Goal: 3 (33/35/45 6256)  Complications: No complications documented.

## 2019-07-13 NOTE — Op Note (Signed)
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  Posterior supplemental pedicle screw fixation and fusion L5-S1.  Iliac crest bone graft harvest through separate incision.  Complications: None  First Assistant: Cleta Alberts, PA  Approach surgeon for anterior exposure: Dr. Annamarie Major  EBL: 150  Implants: Titan/Medtronic anterior intervertebral cage.  Size 14 mm x 7 degree lordosis.  NuVasive MIS pedicle screw fixation.  L5: 6.5 x 45 mm length.  S1: 7.5 x 40 mm length.  Neuro monitoring: No abnormal SSEP or EMG activity noted throughout the case.  Each pedicle screw was directly stimulated.  No abnormal EMG activity noted screws stimulated less than than 35 mA.  Indications: Lee Krause is a very pleasant 54 year old gentleman who was having significant back buttock and radicular leg pain.  Despite appropriate conservative management his quality of life is continued to deteriorate and he expressed a desire to move forward with surgery.  All appropriate risks benefits and alternatives were explained to the patient and consent was obtained.  Operative report: Patient is brought the operating room placed upon the operating room table.  After successful induction of general anesthesia and endotracheal intubation - teds SCDs and a Foley were inserted.  The anterior abdomen was prepped and draped in a standard fashion.  Timeout was taken to confirm patient procedure and all other important data.  At this point time Dr. Trula Slade and Estill Bamberg Ward, PA performed a standard anterior approach to the lumbar spine please refer to his dictation on specifics for the retroperitoneal approach.  Once the appropriate retractors were placed a needle was placed into the L5-S1 disc space and x-ray was taken to confirm that we are at the appropriate level.  Once confirmed Dr.  Trula Slade scrubbed out and I scrubbed in.  I performed an L5-S1 laminotomy using a 10 blade scalpel and then began removing all of the disc material with pituitary rongeurs and curettes.  I continued working posteriorly until I removed all the disc material back to the posterior annulus.  I then used a small curved curette to release the posterior annulus from the posterior aspect of the S1 vertebral body.  I confirmed you with fluoroscopy that my creatinine was behind the vertebral body.  I then was able to remove the posterior osteophyte and the remaining portion of annulus.  I then released the posterior annulus from the L5 vertebral body.  At this point under live fluoroscopy I placed a lamina spreader into the disc space and confirmed that parallel endplate distraction.  At this point I was quite pleased that I had adequately removed all of the disc material and release the posterior annulus.  I then used my trial intervertebral spacers and elected to use the size 14 -7 degree lordotic implant.  At this point I palpated the left iliac crest and infiltrated the area with quarter percent Marcaine with epinephrine.  I made a small incision and bluntly dissected down to the iliac crest.  Using the QuickDraw bone graft harvester I harvested approximately 4 cc of iliac crest bone graft.  I then combined this with DBX mix and packed the cage.  I then irrigated the abdominal wound copiously normal saline and confirmed once again adequate decompression/discectomy.  The cage was then [facility] into position.  I confirmed satisfactory position in both the AP and lateral planes.  I then placed a single locking screw through the cage and  into the L5 vertebral body to prevent anterior migration.  At this point I irrigated both wounds copiously normal saline and then obtained hemostasis using bipolar cautery.  I also used FloSeal at the iliac crest bone graft site.  The deep fascia over the iliac crest was closed with  interrupted 0 Vicryl suture, then a layer 2-0 Vicryl suture, and then 3-0 Monocryl for the skin.  The abdominal wound was closed with a running 0 PDS to reapproximate the deep fascia of the rectus and then a 2-0 Vicryl suture layer and finally 3-0 Monocryl for the skin.  Steri-Strips and dry dressings were applied.  A final intraoperative AP abdominal x-ray was taken and read by the radiologist confirming there was no retained surgical instruments in the wound.  The [facility] spine frame was then secured over the patient and he was rotated into the prone position.  The back was then prepped and draped in a standard fashion.  Timeout was taken again confirming patient procedure and all important data.  Using fluoroscopy identified the lateral borders of the L5 and S1 pedicle bilaterally.  I marked these areas out and infiltrated them with quarter percent Marcaine with Exparel.  I then made small incisions and advanced the Jamshidi needle percutaneously to the lateral aspect of the L5 pedicle.  I confirmed satisfactory position using AP fluoroscopy.  I then advanced the Jamshidi needle using fluoroscopy as well as neuro stimulation.  Once I was nearing the medial wall of the pedicle on the AP view I switched to the lateral view.  I confirmed that it was just beyond the posterior wall of the vertebral body.  At this point I confirmed trajectory and position and so I advanced the Jamshidi needle into the vertebral body.  At no point was there any abnormal EMG activity noted.  I then placed the guidepin to cannulate the pedicle.  I repeated this exact same technique at S1 and on the contralateral L5 and S1 pedicles once all 4 pedicles were cannulated I then measured and placed the appropriate size pedicle screw over the guidepin and advanced into the pedicle.  All 4 pedicle screws had excellent purchase.  At this point I then directly stimulated each of the pedicle screws to ensure there was no abnormal free running  EMG activity.  On the left side the L5 screw stimulated no activity greater than 40 mA, S1 no activity greater than 35 mA.  On the right side the L5 screw no activity at greater than 40 mA, and S1 no activity greater than 37 mA.  At this point I passed the rod percutaneously and then placed the locking caps.  All caps were locked and torqued off according to manufacture standards.  I then remove the inserting tabs and took final x-rays.  The AP and lateral fluoroscopy views were satisfactory.  The anterior cage was well positioned in all 4 pedicle screws were properly positioned in both planes.  The posterior 4 incision sites were irrigated copiously normal saline and closed in a layered fashion with 0 Vicryl suture, 2-0 Vicryl suture, and 3-0 Monocryl.  Steri-Strips dry dressings were applied and the patient was ultimately extubated transfer the PACU without incident.  The end of the case all needle sponge counts were correct.  There were no adverse intraoperative events.

## 2019-07-13 NOTE — Op Note (Signed)
    Patient name: ABOU STERKEL MRN: [id number] DOB: 1965/10/17 Sex: male  07/13/2019 Pre-operative Diagnosis: Degenerative back disease Post-operative diagnosis:  Same Surgeon:  Annamarie Major Co-surgeon: Melina Schools Procedure:   Anterior exposure L5-S1 Anesthesia: General Blood Loss: 100 cc Specimens: None  Findings: Normal anatomy  Indications: Anterior instrumentation has been recommended by Dr. Rolena Infante at the L5-S1 level.  I discussed my role in the procedure at his preoperative clinic visit.  Procedure:  The patient was identified in the holding area and taken to [place]  The patient was then placed supine on the table. general anesthesia was administered.  The patient was prepped and draped in the usual sterile fashion.  A time out was called and antibiotics were administered.  Fluoroscopy was used to determine the appropriate level of skin incision.  A transverse left lower quadrant incision was then made beginning at the midline extending laterally.  Cautery was used about subcutaneous tissue down to the anterior abdominal wall fascia which was opened with cautery.  Subfascial flaps were then raised.  I then entered the retroperitoneal space lateral to the rectus muscle.  Identified the external iliac artery which was disease-free.  Blunt dissection was then used to mobilize the tissue cephalad and medial.  The common iliac vein was circumferentially exposed.  There did appear to be a low bifurcation of the vena cava.  The median sacral vessels were ligated between clips.  The NuVasive retractor was then positioned.  I first placed a 160 blade on the right lateral side of the spine.  I then continue to mobilize the iliac vein until I reached the lateral left side of the spine and placed another 160 blade.  A 160 blade was placed inferiorly and a 180 blade was placed superiorly.  A spinal needle was then placed.  Fluoroscopy came back and confirmed that we were at the appropriate  location.  At this point, Dr. Rolena Infante took over the procedure.  Please see his detailed operative note for the remaining portion of the procedure.   Disposition: To PACU stable.   Theotis Burrow, M.D., [facility] [place] Office: [phone] Pager:  [phone]

## 2019-07-14 ENCOUNTER — Encounter (HOSPITAL_COMMUNITY): Payer: Self-pay | Admitting: Orthopedic Surgery

## 2019-07-14 ENCOUNTER — Inpatient Hospital Stay (HOSPITAL_COMMUNITY): Payer: 59

## 2019-07-14 DIAGNOSIS — Z01810 Encounter for preprocedural cardiovascular examination: Secondary | ICD-10-CM

## 2019-07-14 LAB — CBC
HCT: 34.6 % — ABNORMAL LOW (ref 39.0–52.0)
Hemoglobin: 11.8 g/dL — ABNORMAL LOW (ref 13.0–17.0)
MCH: 30.3 pg (ref 26.0–34.0)
MCHC: 34.1 g/dL (ref 30.0–36.0)
MCV: 88.9 fL (ref 80.0–100.0)
Platelets: 291 10*3/uL (ref 150–400)
RBC: 3.89 MIL/uL — ABNORMAL LOW (ref 4.22–5.81)
RDW: 12.2 % (ref 11.5–15.5)
WBC: 17.8 10*3/uL — ABNORMAL HIGH (ref 4.0–10.5)
nRBC: 0 % (ref 0.0–0.2)

## 2019-07-14 LAB — CREATININE, SERUM
Creatinine, Ser: 1.17 mg/dL (ref 0.61–1.24)
GFR calc Af Amer: >60 mL/min (ref >60)
GFR calc non Af Amer: >60 mL/min (ref >60)

## 2019-07-14 NOTE — Progress Notes (Signed)
Patient is discharged from room 3C07 at this time. Alert and in stable condition. IV site d/c'd and instructions read to patient and spouse with understanding verbalized and all questions answered. Left unit via wheelchair with all belongings at side. 

## 2019-07-14 NOTE — Evaluation (Signed)
Occupational Therapy Evaluation Patient Details Name: Lee Krause MRN: [id number] DOB: 11/11/1965 Today's Date: 07/14/2019    History of Present Illness Pt is a 54 y/o male s/p ALIF and PLIF at L5-S1. PMH including but not limited to CAD, HLD and HTN.   Clinical Impression   Patient evaluated by Occupational Therapy with no further acute OT needs identified. All education has been completed and the patient has no further questions. See below for any follow-up Occupational Therapy or equipment needs. OT to sign off. Thank you for referral.      Follow Up Recommendations  No OT follow up    Equipment Recommendations  None recommended by OT    Recommendations for Other Services       Precautions / Restrictions Precautions Precautions: Back Precaution Booklet Issued: Yes (comment) Precaution Comments: reviewed adls with back precautions Required Braces or Orthoses: Spinal Brace Spinal Brace: Lumbar corset;Applied in sitting position Restrictions Weight Bearing Restrictions: No      Mobility Bed Mobility Overal bed mobility: Needs Assistance Bed Mobility: Sit to Sidelying;Rolling Rolling: Supervision Sidelying to sit: Supervision     Sit to sidelying: Supervision General bed mobility comments: standing from transport chair on arrival   Transfers Overall transfer level: Needs assistance Equipment used: None Transfers: Sit to/from Stand Sit to Stand: Supervision         General transfer comment: for safety    Balance Overall balance assessment: No apparent balance deficits (not formally assessed)                                         ADL either performed or assessed with clinical judgement   ADL Overall ADL's : Modified independent                                       General ADL Comments: demonstrates shower transfer, LB dressing, don doff brace    Back handout provided and reviewed adls in detail. Pt educated on:  clothing between brace, never sleep in brace, set an alarm at night for medication, avoid sitting for long periods of time, correct bed positioning for sleeping, correct sequence for bed mobility, avoiding lifting more than 5 pounds and never wash directly over incision. All education is complete and patient indicates understanding.    Vision Baseline Vision/History: No visual deficits       Perception     Praxis      Pertinent Vitals/Pain Pain Assessment: Faces Pain Score: 7  Pain Location: back, hips Pain Descriptors / Indicators: Sore Pain Intervention(s): Monitored during session;Premedicated before session;Repositioned     Hand Dominance Right   Extremity/Trunk Assessment Upper Extremity Assessment Upper Extremity Assessment: Overall WFL for tasks assessed   Lower Extremity Assessment Lower Extremity Assessment: Defer to PT evaluation   Cervical / Trunk Assessment Cervical / Trunk Assessment: Other exceptions Cervical / Trunk Exceptions: s/p lumbar sx   Communication Communication Communication: No difficulties   Cognition Arousal/Alertness: Awake/alert Behavior During Therapy: WFL for tasks assessed/performed Overall Cognitive Status: Within Functional Limits for tasks assessed                                     General Comments  Exercises     Shoulder Instructions      Home Living Family/patient expects to be discharged to:: Private residence Living Arrangements: Spouse/significant other;Children Available Help at Discharge: Family Type of Home: House Home Access: Stairs to enter Technical brewer of Steps: 2 Entrance Stairs-Rails: None Home Layout: One level     Bathroom Shower/Tub: Teacher, early years/pre: Standard     Home Equipment: Hand held shower head   Additional Comments: wife and 68 yo son at home      Prior Functioning/Environment Level of Independence: Independent                 OT  Problem List: Pain      OT Treatment/Interventions:      OT Goals(Current goals can be found in the care plan section) Acute Rehab OT Goals Patient Stated Goal: "home today" Potential to Achieve Goals: Good  OT Frequency:     Barriers to D/C:            Co-evaluation              AM-PAC OT "6 Clicks" Daily Activity     Outcome Measure Help from another person eating meals?: None Help from another person taking care of personal grooming?: None Help from another person toileting, which includes using toliet, bedpan, or urinal?: None Help from another person bathing (including washing, rinsing, drying)?: None Help from another person to put on and taking off regular upper body clothing?: None Help from another person to put on and taking off regular lower body clothing?: None 6 Click Score: 24   End of Session Equipment Utilized During Treatment: Back brace Nurse Communication: Mobility status;Precautions  Activity Tolerance: Patient tolerated treatment well Patient left: Other (comment) (walking the hall)  OT Visit Diagnosis: Unsteadiness on feet (R26.81)                Time: 0910-0920 OT Time Calculation (min): 10 min Charges:  OT General Charges $OT Visit: 1 Visit OT Evaluation $OT Eval Low Complexity: 1 Low   Brynn, OTR/L  Acute Rehabilitation Services Pager: [phone] Office: [phone] .   Jeri Modena 07/14/2019, 10:23 AM

## 2019-07-14 NOTE — Anesthesia Postprocedure Evaluation (Signed)
Anesthesia Post Note  Patient: Lee Krause  Procedure(s) Performed: ANTERIOR LUMBAR FUSION (ALIF) L5-S1 (N/A Abdomen) POSTERIOR LUMBAR FUSION LUMBAR FIVE-SACRAL ONE (Spine Lumbar) HARVEST ILIAC BONE GRAFT (Left Back) ABDOMINAL EXPOSURE (N/A )     Patient location during evaluation: PACU Anesthesia Type: General Level of consciousness: sedated and patient cooperative Pain management: pain level controlled Vital Signs Assessment: post-procedure vital signs reviewed and stable Respiratory status: spontaneous breathing Cardiovascular status: stable Anesthetic complications: no   No complications documented.  Last Vitals:  Vitals:   07/14/19 0403 07/14/19 0748  BP: 130/81 133/87  Pulse: 99 95  Resp: (!) 2 18  Temp: 37.3 C 37.1 C  SpO2: 95% 99%    Last Pain:  Vitals:   07/14/19 1122  TempSrc:   PainSc: [place]

## 2019-07-14 NOTE — Discharge Summary (Signed)
Patient ID: Lee Krause MRN: [id number] DOB/AGE: August 07, 1965 54 y.o.  Admit date: 07/13/2019 Discharge date: 07/14/2019  Admission Diagnoses:  Active Problems:   S/P lumbar fusion   Discharge Diagnoses:  Active Problems:   S/P lumbar fusion  status post Procedure(s): ANTERIOR LUMBAR FUSION (ALIF) L5-S1 ABDOMINAL EXPOSURE POSTERIOR LUMBAR FUSION LUMBAR FIVE-SACRAL ONE HARVEST ILIAC BONE GRAFT  Past Medical History:  Diagnosis Date   Allergy    Anemia    Angina at rest [facility])    Arthritis    Chest pain    Gastritis    Gastrointestinal hemorrhage    GERD (gastroesophageal reflux disease)    Hyperlipidemia    Hypertension    Tobacco abuse     Surgeries: Procedure(s): ANTERIOR LUMBAR FUSION (ALIF) L5-S1 ABDOMINAL EXPOSURE POSTERIOR LUMBAR FUSION LUMBAR FIVE-SACRAL ONE HARVEST ILIAC BONE GRAFT on 07/13/2019   Consultants:   Discharged Condition: Improved  Hospital Course: Lee Krause is an 54 y.o. 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 Patient failed conservative treatments (please see the history and physical for the specifics) and had severe unremitting pain that affects sleep, daily activities and work/hobbies. After pre-op clearance, the patient was taken to the operating room on 07/13/2019 and underwent  Procedure(s): ANTERIOR LUMBAR FUSION (ALIF) L5-S1 ABDOMINAL EXPOSURE POSTERIOR LUMBAR FUSION LUMBAR FIVE-SACRAL ONE HARVEST ILIAC BONE GRAFT.    Patient was given perioperative antibiotics:  Anti-infectives (From admission, onward)   Start     Dose/Rate Route Frequency Ordered Stop   07/13/19 1600  ceFAZolin (ANCEF) IVPB 1 g/50 mL premix        1 g 100 mL/hr over 30 Minutes Intravenous Every 8 hours 07/13/19 1548 07/14/19 0020   07/13/19 0559  ceFAZolin (ANCEF) 2-4 GM/100ML-% IVPB       Note to Pharmacy: Gleason, Ginger   : cabinet override      07/13/19 0559  07/13/19 0747   07/13/19 0555  ceFAZolin (ANCEF) IVPB 2g/100 mL premix        2 g 200 mL/hr over 30 Minutes Intravenous 30 min pre-op 07/13/19 0555 07/13/19 1146       Patient was given sequential compression devices and early ambulation to prevent DVT.   Patient benefited maximally from hospital stay and there were no complications. At the time of discharge, the patient was urinating/moving their bowels without difficulty, tolerating a regular diet, pain is controlled with oral pain medications and they have been cleared by PT/OT.   Recent vital signs:  Patient Vitals for the past 24 hrs:  BP Temp Temp src Pulse Resp SpO2  07/14/19 0748 133/87 98.7 F (37.1 C) Oral 95 18 99 %  07/14/19 0403 130/81 99.2 F (37.3 C) Oral 99 (!) 2 95 %  07/13/19 2351 130/81 98.7 F (37.1 C) Oral (!) 101 20 99 %  07/13/19 2050 124/78 99.7 F (37.6 C) Oral (!) 101 18 98 %  07/13/19 1553 123/88 98.3 F (36.8 C) Oral 97 20 99 %     Recent laboratory studies:  Recent Labs    07/14/19 0632  WBC 17.8*  HGB 11.8*  HCT 34.6*  PLT 291  CREATININE 1.17     Discharge Medications:   Allergies as of 07/14/2019      Reactions   Nsaids    Internal bleeding      Medication List    STOP taking these medications   acetaminophen 650 MG CR tablet Commonly known as:  TYLENOL   aspirin EC 81 MG tablet     TAKE these medications   amLODipine 5 MG tablet Commonly known as: NORVASC Take 5 mg by mouth daily.   amLODipine 10 MG tablet Commonly known as: NORVASC Take 1 tablet (10 mg total) by mouth daily.   atorvastatin 80 MG tablet Commonly known as: LIPITOR Take 1 tablet (80 mg total) by mouth daily. What changed: when to take this   clopidogrel 75 MG tablet Commonly known as: PLAVIX Take 1 tablet by mouth once daily   fluticasone 50 MCG/ACT nasal spray Commonly known as: FLONASE Place 1 spray into both nostrils daily as needed for allergies or rhinitis.   hydrochlorothiazide 12.5 MG  capsule Commonly known as: MICROZIDE TAKE 1 CAPSULE BY MOUTH ONCE DAILY . APPOINTMENT REQUIRED FOR FUTURE REFILLS   methocarbamol 500 MG tablet Commonly known as: Robaxin Take 1 tablet (500 mg total) by mouth every 8 (eight) hours as needed for up to 5 days for muscle spasms.   metoprolol succinate 25 MG 24 hr tablet Commonly known as: TOPROL-XL TAKE 1 TABLET BY MOUTH ONCE DAILY WITH  OR  IMMEDIATELY  FOLLOWING  A  MEAL What changed: See the new instructions.   Nexlizet 180-10 MG Tabs Generic drug: Bempedoic Acid-Ezetimibe Take 1 tablet by mouth daily.   ondansetron 4 MG tablet Commonly known as: Zofran Take 1 tablet (4 mg total) by mouth every 8 (eight) hours as needed for nausea or vomiting.   oxyCODONE-acetaminophen 10-325 MG tablet Commonly known as: Percocet Take 1 tablet by mouth every 6 (six) hours as needed for up to 5 days for pain.            Durable Medical Equipment  (From admission, onward)         Start     Ordered   07/14/19 1046  For home use only DME 3 n 1  Once        07/14/19 1045          Diagnostic Studies: DG Chest 2 View  Result Date: 06/30/2019 CLINICAL DATA:  Preoperative exam. EXAM: CHEST - 2 VIEW COMPARISON:  05/08/2019. FINDINGS: Mediastinum and hilar structures normal. Heart size normal. Mild left base subsegmental atelectasis versus scarring again noted. No pleural effusion or pneumothorax. IMPRESSION: Mild left base subsegmental atelectasis versus scarring again noted. No new abnormality identified. Electronically Signed   By: Marcello Moores  Register   On: 06/30/2019 14:59   DG Lumbar Spine 2-3 Views  Result Date: 07/13/2019 CLINICAL DATA:  Anterior L5-S1 fusion EXAM: DG C-ARM 1-60 MIN; LUMBAR SPINE - 2-3 VIEW COMPARISON:  None. FLUOROSCOPY TIME:  Fluoroscopy Time:  3 minutes 24 seconds Radiation Exposure Index (if provided by the fluoroscopic device): 145.09 mGy Number of Acquired Spot Images: 2 FINDINGS: Pedicle screws and posterior fixation  are noted at L5-S1. Interval anterior fusion device is now seen. No acute abnormality is noted. IMPRESSION: Status post anterior L5-S1 fusion. Electronically Signed   By: Inez Catalina M.D.   On: 07/13/2019 14:44   DG C-Arm 1-60 Min  Result Date: 07/13/2019 CLINICAL DATA:  Anterior L5-S1 fusion EXAM: DG C-ARM 1-60 MIN; LUMBAR SPINE - 2-3 VIEW COMPARISON:  None. FLUOROSCOPY TIME:  Fluoroscopy Time:  3 minutes 24 seconds Radiation Exposure Index (if provided by the fluoroscopic device): 145.09 mGy Number of Acquired Spot Images: 2 FINDINGS: Pedicle screws and posterior fixation are noted at L5-S1. Interval anterior fusion device is now seen. No acute abnormality is noted. IMPRESSION: Status post  anterior L5-S1 fusion. Electronically Signed   By: Inez Catalina M.D.   On: 07/13/2019 14:44   VAS [place] LOWER EXTREMITY VENOUS (DVT)  Result Date: 07/14/2019  Lower Venous DVTStudy Indications: S/p ALIF.  Comparison Study: no prior Performing Technologist: Abram Sander RVS  Examination Guidelines: A complete evaluation includes B-mode imaging, spectral Doppler, color Doppler, and power Doppler as needed of all accessible portions of each vessel. Bilateral testing is considered an integral part of a complete examination. Limited examinations for reoccurring indications may be performed as noted. The reflux portion of the exam is performed with the patient in reverse Trendelenburg.  +---------+---------------+---------+-----------+----------+--------------+  RIGHT     Compressibility Phasicity Spontaneity Properties Thrombus Aging  +---------+---------------+---------+-----------+----------+--------------+  CFV       Full            Yes       Yes                                    +---------+---------------+---------+-----------+----------+--------------+  SFJ       Full                                                             +---------+---------------+---------+-----------+----------+--------------+  FV Prox   Full                                                              +---------+---------------+---------+-----------+----------+--------------+  FV Mid    Full                                                             +---------+---------------+---------+-----------+----------+--------------+  FV Distal Full                                                             +---------+---------------+---------+-----------+----------+--------------+  PFV       Full                                                             +---------+---------------+---------+-----------+----------+--------------+  POP       Full            Yes       Yes                                    +---------+---------------+---------+-----------+----------+--------------+  PTV       Full                                                             +---------+---------------+---------+-----------+----------+--------------+  PERO      Full                                                             +---------+---------------+---------+-----------+----------+--------------+   +---------+---------------+---------+-----------+----------+--------------+  LEFT      Compressibility Phasicity Spontaneity Properties Thrombus Aging  +---------+---------------+---------+-----------+----------+--------------+  CFV       Full            Yes       Yes                                    +---------+---------------+---------+-----------+----------+--------------+  SFJ       Full                                                             +---------+---------------+---------+-----------+----------+--------------+  FV Prox   Full                                                             +---------+---------------+---------+-----------+----------+--------------+  FV Mid    Full                                                             +---------+---------------+---------+-----------+----------+--------------+  FV Distal Full                                                              +---------+---------------+---------+-----------+----------+--------------+  PFV       Full                                                             +---------+---------------+---------+-----------+----------+--------------+  POP       Full            Yes       Yes                                    +---------+---------------+---------+-----------+----------+--------------+  PTV       Full                                                             +---------+---------------+---------+-----------+----------+--------------+  PERO                                                       Not visualized  +---------+---------------+---------+-----------+----------+--------------+     Summary: BILATERAL: - No evidence of deep vein thrombosis seen in the lower extremities, bilaterally. -No evidence of popliteal cyst, bilaterally.   *See table(s) above for measurements and observations. Electronically signed by Monica Martinez MD on 07/14/2019 at 2:38:22 PM.    Final    DG OR LOCAL ABDOMEN  Result Date: 07/13/2019 CLINICAL DATA:  Possible retained instruments from surgery. EXAM: OR LOCAL ABDOMEN COMPARISON:  None. FINDINGS: The bowel gas pattern is normal. Status post surgical anterior fusion of L5-S1. No other radiopaque foreign body is noted. Phleboliths are noted in the pelvis. IMPRESSION: No evidence of bowel obstruction or ileus. Status post surgical anterior fusion of L5-S1. No other radiopaque foreign body is noted. These results were called by telephone at the time of interpretation on 07/13/2019 at 11:19 am to provider [facility] , who verbally acknowledged these results. Electronically Signed   By: Marijo Conception M.D.   On: 07/13/2019 11:19    Discharge Instructions    Incentive spirometry RT   Complete by: As directed        Follow-up Information    Schedule an appointment as soon as possible for a visit with Melina Schools, MD.   Specialty: Orthopedic Surgery Why: If symptoms worsen, For  suture removal, For wound re-check Contact information: [address] [place] [address] [phone]               Discharge Plan:  discharge to home  Disposition: stable    Signed: Yvonne Kendall Kermit Paone for [facility] PA-C [facility] [phone] 07/14/2019, 3:34 PM

## 2019-07-14 NOTE — Progress Notes (Signed)
Subjective: 1 Day Post-Op Procedure(s) (LRB): ANTERIOR LUMBAR FUSION (ALIF) L5-S1 (N/A) ABDOMINAL EXPOSURE (N/A) POSTERIOR LUMBAR FUSION LUMBAR FIVE-SACRAL ONE HARVEST ILIAC BONE GRAFT (Left) Patient reports pain as moderate.   Denies neuropathic leg pain Tolerating PO without N/V +void +flatus, +BM +ambulation Denies [place] pain, calf pain, SOB Eager to go home  Objective: Vital signs in last 24 hours: Temp:  [97 F (36.1 C)-99.7 F (37.6 C)] 98.7 F (37.1 C) (07/02 0748) Pulse Rate:  [95-101] 95 (07/02 0748) Resp:  [2-23] 18 (07/02 0748) BP: (122-154)/(78-91) 133/87 (07/02 0748) SpO2:  [91 %-100 %] 99 % (07/02 0748)  Intake/Output from previous day: 07/01 0701 - 07/02 0700 In: 2800 [I.V.:2550; IV Piggyback:250] Out: 550 [Urine:400; Blood:150] Intake/Output this shift: No intake/output data recorded.  Recent Labs    07/14/19 0632  HGB 11.8*   Recent Labs    07/14/19 0632  WBC 17.8*  RBC 3.89*  HCT 34.6*  PLT 291   Recent Labs    07/14/19 0632  CREATININE 1.17   No results for input(s): LABPT, INR in the last 72 hours.  Neurologically intact ABD soft Neurovascular intact Sensation intact distally Intact pulses distally Dorsiflexion/Plantar flexion intact Incision: dressing C/D/I No cellulitis present Compartment soft   Assessment/Plan: 1 Day Post-Op Procedure(s) (LRB): ANTERIOR LUMBAR FUSION (ALIF) L5-S1 (N/A) ABDOMINAL EXPOSURE (N/A) POSTERIOR LUMBAR FUSION LUMBAR FIVE-SACRAL ONE HARVEST ILIAC BONE GRAFT (Left)  PO pain meds DVT PPx: Teds, SCDs, ambulation, lovenox X10 days. Will get LE doppler today to R/o DVT Encourage IS  Plan D/C home today after LE doppler  F/u with Dr. Rolena Infante in office in 2 weeks     Yvonne Kendall Jaivyn Gulla 07/14/2019, 8:25 AM

## 2019-07-14 NOTE — Progress Notes (Signed)
Lower extremity venous has been completed.   Preliminary results in CV Proc.   Abram Sander 07/14/2019 9:01 AM

## 2019-07-14 NOTE — Progress Notes (Signed)
Physical Therapy Progress Note  Clinical Impression: Pt seen for a second session for stair training and further education. Pt able to perform without difficulties. PT provided pt education re: back precautions, car transfers and a generalized walking program for pt to initiate upon d/c home. Pt expressed understanding.     07/14/19 0939  PT Visit Information  Last PT Received On 07/14/19  Assistance Needed +1  History of Present Illness Pt is a 54 y/o male s/p ALIF and PLIF at L5-S1. PMH including but not limited to CAD, HLD and HTN.  Precautions  Precautions Back  Precaution Booklet Issued Yes (comment)  Precaution Comments reviewed with pt throughout  Required Braces or Orthoses Spinal Brace  Spinal Brace Lumbar corset;Applied in sitting position  Restrictions  Weight Bearing Restrictions No  Pain Assessment  Pain Assessment 0-10  Pain Score 7  Pain Location back, hips  Pain Descriptors / Indicators Sore  Pain Intervention(s) Monitored during session;Repositioned  Cognition  Arousal/Alertness Awake/alert  Behavior During Therapy WFL for tasks assessed/performed  Overall Cognitive Status Within Functional Limits for tasks assessed  Bed Mobility  Overal bed mobility Needs Assistance  Bed Mobility Sit to Sidelying;Rolling  Rolling Supervision  Sit to sidelying Supervision  General bed mobility comments cueing for log roll technique  Transfers  Overall transfer level Needs assistance  Equipment used None  Transfers Sit to/from Stand  Sit to Stand Supervision  General transfer comment for safety  Ambulation/Gait  Ambulation/Gait assistance Supervision  Gait Distance (Feet) 250 Feet  Assistive device None  Gait Pattern/deviations Step-through pattern;Decreased stride length  General Gait Details pt steady in hallway without an AD or need for physical assistance, no LOB or instability noted  Gait velocity decreased  Stairs Yes  Stairs assistance Supervision  Stair  Management One rail Left;Alternating pattern;Forwards  Number of Stairs 10  General stair comments pt performing without any difficulties  Balance  Overall balance assessment No apparent balance deficits (not formally assessed)  PT - End of Session  Equipment Utilized During Treatment Back brace  Activity Tolerance Patient tolerated treatment well  Patient left in bed;with call bell/phone within reach  Nurse Communication Mobility status   PT - Assessment/Plan  PT Plan Current plan remains appropriate  PT Visit Diagnosis Other abnormalities of gait and mobility (R26.89)  PT Frequency (ACUTE ONLY) Min 5X/week  Follow Up Recommendations No PT follow up  PT equipment None recommended by PT  AM-PAC PT "6 Clicks" Mobility Outcome Measure (Version 2)  Help needed turning from your back to your side while in a flat bed without using bedrails? 4  Help needed moving from lying on your back to sitting on the side of a flat bed without using bedrails? 4  Help needed moving to and from a bed to a chair (including a wheelchair)? 4  Help needed standing up from a chair using your arms (e.g., wheelchair or bedside chair)? 4  Help needed to walk in hospital room? 4  Help needed climbing 3-5 steps with a railing?  4  6 Click Score 24  Consider Recommendation of Discharge To: Home with no services  PT Goal Progression  Progress towards PT goals Progressing toward goals  Acute Rehab PT Goals  PT Goal Formulation With patient  Time For Goal Achievement 07/28/19  Potential to Achieve Goals Good  PT Time Calculation  PT Start Time (ACUTE ONLY) 0920  PT Stop Time (ACUTE ONLY) 0930  PT Time Calculation (min) (ACUTE ONLY) 10 min  PT  General Charges  $$ ACUTE PT VISIT 1 Visit  PT Treatments  $Gait Training 8-22 mins  Anastasio Champion, DPT  Acute Rehabilitation Services Pager [phone] Office [phone]

## 2019-07-14 NOTE — Evaluation (Signed)
Physical Therapy Evaluation Patient Details Name: Lee Krause MRN: [id number] DOB: October 23, 1965 Today's Date: 07/14/2019   History of Present Illness  Pt is a 54 y/o male s/p ALIF and PLIF at L5-S1. PMH including but not limited to CAD, HLD and HTN.  Clinical Impression  Pt presented supine in bed with HOB elevated, awake and willing to participate in therapy session. Prior to admission, pt reported that he was independent with all functional mobility and ADLs. Pt lives with his spouse and son in a single level home with two steps to enter. At the time of evaluation, pt overall moving very well without the need for any physical assistance or use of an AD. Limited session as transport arrived and needed to take pt to vascular. Will f/u with pt for a second session for stair training. Pt would continue to benefit from skilled physical therapy services at this time while admitted and after d/c to address the below listed limitations in order to improve overall safety and independence with functional mobility.     Follow Up Recommendations No PT follow up    Equipment Recommendations  None recommended by PT    Recommendations for Other Services       Precautions / Restrictions Precautions Precautions: Back Precaution Booklet Issued: Yes (comment) Precaution Comments: reviewed with pt throughout Required Braces or Orthoses: Spinal Brace Spinal Brace: Lumbar corset;Applied in sitting position Restrictions Weight Bearing Restrictions: No      Mobility  Bed Mobility Overal bed mobility: Needs Assistance Bed Mobility: Rolling;Sidelying to Sit Rolling: Supervision Sidelying to sit: Supervision       General bed mobility comments: cueing for log roll technique  Transfers Overall transfer level: Needs assistance Equipment used: None Transfers: Sit to/from Stand Sit to Stand: Supervision         General transfer comment: for safety  Ambulation/Gait Ambulation/Gait  assistance: Supervision Gait Distance (Feet): 30 Feet Assistive device: None Gait Pattern/deviations: Step-through pattern;Decreased stride length Gait velocity: decreased   General Gait Details: pt steady overall with in room ambulation; limited in distance as transport arriving to take pt to vascular  Stairs            Wheelchair Mobility    Modified Rankin (Stroke Patients Only)       Balance Overall balance assessment: No apparent balance deficits (not formally assessed)                                           Pertinent Vitals/Pain Pain Assessment: 0-10 Pain Score: 7  Pain Location: back, hips Pain Descriptors / Indicators: Sore Pain Intervention(s): Monitored during session;Repositioned    Home Living Family/patient expects to be discharged to:: Private residence Living Arrangements: Spouse/significant other;Children Available Help at Discharge: Family Type of Home: House Home Access: Stairs to enter Entrance Stairs-Rails: None Technical brewer of Steps: 2 Home Layout: One level Home Equipment: Hand held shower head      Prior Function Level of Independence: Independent               Hand Dominance        Extremity/Trunk Assessment   Upper Extremity Assessment Upper Extremity Assessment: Overall WFL for tasks assessed    Lower Extremity Assessment Lower Extremity Assessment: Overall WFL for tasks assessed    Cervical / Trunk Assessment Cervical / Trunk Assessment: Other exceptions Cervical / Trunk Exceptions: s/p lumbar sx  Communication   Communication: No difficulties  Cognition Arousal/Alertness: Awake/alert Behavior During Therapy: WFL for tasks assessed/performed Overall Cognitive Status: Within Functional Limits for tasks assessed                                        General Comments      Exercises     Assessment/Plan    PT Assessment Patient needs continued PT services  PT  Problem List Decreased knowledge of precautions;Pain       PT Treatment Interventions DME instruction;Gait training;Stair training;Functional mobility training;Therapeutic activities;Therapeutic exercise;Balance training;Neuromuscular re-education;Patient/family education    PT Goals (Current goals can be found in the Care Plan section)  Acute Rehab PT Goals Patient Stated Goal: "home today" PT Goal Formulation: With patient Time For Goal Achievement: 07/28/19 Potential to Achieve Goals: Good    Frequency Min 5X/week   Barriers to discharge        Co-evaluation               AM-PAC PT "6 Clicks" Mobility  Outcome Measure Help needed turning from your back to your side while in a flat bed without using bedrails?: None Help needed moving from lying on your back to sitting on the side of a flat bed without using bedrails?: None Help needed moving to and from a bed to a chair (including a wheelchair)?: None Help needed standing up from a chair using your arms (e.g., wheelchair or bedside chair)?: None Help needed to walk in hospital room?: None Help needed climbing 3-5 steps with a railing? : None 6 Click Score: 24    End of Session Equipment Utilized During Treatment: Back brace Activity Tolerance: Patient tolerated treatment well Patient left: Other (comment) (in w/c with transport to go to vascular) Nurse Communication: Mobility status PT Visit Diagnosis: Other abnormalities of gait and mobility (R26.89)    Time: 7939-0300 PT Time Calculation (min) (ACUTE ONLY): 10 min   Charges:   PT Evaluation $PT Eval Low Complexity: 1 Low          Eduard Clos, PT, DPT  Acute Rehabilitation Services Pager [phone] Office [place] 07/14/2019, 9:37 AM

## 2019-07-18 ENCOUNTER — Encounter (HOSPITAL_COMMUNITY): Payer: 59

## 2019-07-18 MED FILL — Heparin Sodium (Porcine) Inj 1000 Unit/ML: INTRAMUSCULAR | Qty: 30 | Status: AC

## 2019-07-18 MED FILL — Sodium Chloride IV Soln 0.9%: INTRAVENOUS | Qty: 1000 | Status: AC

## 2019-07-22 ENCOUNTER — Other Ambulatory Visit: Payer: Self-pay

## 2019-07-22 ENCOUNTER — Emergency Department (HOSPITAL_COMMUNITY)
Admission: EM | Admit: 2019-07-22 | Discharge: 2019-07-22 | Disposition: A | Discharge status: Home / Self Care | Payer: 59 | Attending: Emergency Medicine | Admitting: Emergency Medicine

## 2019-07-22 ENCOUNTER — Other Ambulatory Visit: Payer: Self-pay | Admitting: Family Medicine

## 2019-07-22 DIAGNOSIS — Z5321 Procedure and treatment not carried out due to patient leaving prior to being seen by health care provider: Secondary | ICD-10-CM | POA: Diagnosis not present

## 2019-07-22 DIAGNOSIS — R569 Unspecified convulsions: Secondary | ICD-10-CM | POA: Diagnosis present

## 2019-07-22 DIAGNOSIS — I10 Essential (primary) hypertension: Secondary | ICD-10-CM

## 2019-07-26 ENCOUNTER — Telehealth (INDEPENDENT_AMBULATORY_CARE_PROVIDER_SITE_OTHER): Payer: 59 | Admitting: Family Medicine

## 2019-07-26 ENCOUNTER — Encounter: Payer: Self-pay | Admitting: Family Medicine

## 2019-07-26 ENCOUNTER — Other Ambulatory Visit: Payer: Self-pay

## 2019-07-26 DIAGNOSIS — I10 Essential (primary) hypertension: Secondary | ICD-10-CM

## 2019-07-26 MED ORDER — HYDROCHLOROTHIAZIDE 12.5 MG PO CAPS: ORAL_CAPSULE | ORAL | 1 refills | Status: AC

## 2019-07-26 NOTE — Progress Notes (Signed)
Virtual Visit via Telephone Note  I connected with Lee Krause on 07/26/19 at 5:30 PM by telephone and verified that I am speaking with the correct person using two identifiers. Patient location:home My location: office   I discussed the limitations, risks, security and privacy concerns of performing an evaluation and management service by telephone and the availability of in person appointments. I also discussed with the patient that there may be a patient responsible charge related to this service. The patient expressed understanding and agreed to proceed, consent obtained  Chief complaint: Chief Complaint  Patient presents with  . Medication Refill    on hydrocholorthizide. Pt reports his BP has been high, but pt states that is due to his resent surgery on the 1st of this month. pt reports he went to [facility] because he was coughing so hard it was causing him to pass out.     History of Present Illness:  Hypertension: Hctz 12.5mg  qd, toprol 25mg  qd, amlodipine 5mg .  No new side effects.  Constitutional: Negative for fatigue and unexpected weight change.  Eyes: Negative for visual disturbance.  Respiratory: negative chest tightness and shortness of breath, cough improving.  Cardiovascular: Negative for chest pain, palpitations and leg swelling.  Gastrointestinal: Negative for abdominal pain and blood in stool.  Neurological: Negative for dizziness, light-headedness and headaches.     Home readings: 123/87, 117/79.  BP Readings from Last 3 Encounters:  07/26/19 123/87  07/22/19 (!) 145/101  07/14/19 133/87   Lab Results  Component Value Date   CREATININE 1.17 07/14/2019   Cough: ER visit 7/10. Thought to have post tussive symmetry. Cough has improved. No furthter near-syncope in past few days. No fever/dyspnea.  Feeling better with mucinex.  Has follow up in 2 days with back surgeon (surgery on 7/1).     Patient Active Problem List   Diagnosis Date Noted  . S/P  lumbar fusion 07/13/2019  . CAD (coronary artery disease) 09/28/2017  . Hyperlipidemia 09/28/2017  . Atypical chest pain 04/05/2017  . Essential hypertension 04/05/2017  . Hematochezia   . Lower GI bleed 05/22/2016  . Family history of ischemic heart disease 11/18/2012  . Tobacco use disorder 11/18/2012   Past Medical History:  Diagnosis Date  . Allergy   . Anemia   . Angina at rest [facility])   . Arthritis   . Chest pain   . Gastritis   . Gastrointestinal hemorrhage   . GERD (gastroesophageal reflux disease)   . Hyperlipidemia   . Hypertension   . Tobacco abuse    Past Surgical History:  Procedure Laterality Date  . ABDOMINAL EXPOSURE N/A 07/13/2019   Procedure: ABDOMINAL EXPOSURE;  Surgeon: Serafina Mitchell, MD;  Location: [facility] OR;  Service: Vascular;  Laterality: N/A;  . ANTERIOR LUMBAR FUSION N/A 07/13/2019   Procedure: ANTERIOR LUMBAR FUSION (ALIF) L5-S1;  Surgeon: Melina Schools, MD;  Location: [place];  Service: Orthopedics;  Laterality: N/A;  . CARDIAC CATHETERIZATION    . HARVEST BONE GRAFT Left 07/13/2019   Procedure: HARVEST ILIAC BONE GRAFT;  Surgeon: Melina Schools, MD;  Location: [place];  Service: Orthopedics;  Laterality: Left;  . INTRAVASCULAR PRESSURE WIRE/FFR STUDY N/A 09/24/2017   Procedure: INTRAVASCULAR PRESSURE WIRE/FFR STUDY;  Surgeon: Belva Crome, MD;  Location: [place] CV LAB;  Service: Cardiovascular;  Laterality: N/A;  . LEFT HEART CATH AND CORONARY ANGIOGRAPHY N/A 09/24/2017   Procedure: LEFT HEART CATH AND CORONARY ANGIOGRAPHY;  Surgeon: Belva Crome, MD;  Location: [facility]  INVASIVE CV LAB;  Service: Cardiovascular;  Laterality: N/A;  . TOOTH EXTRACTION     Allergies  Allergen Reactions  . Nsaids     Internal bleeding   Prior to Admission medications   Medication Sig Start Date End Date Taking? Authorizing Provider  amLODipine (NORVASC) 10 MG tablet Take 1 tablet (10 mg total) by mouth daily. 10/13/18  Yes Jettie Booze, MD  amLODipine (NORVASC) 5  MG tablet Take 5 mg by mouth daily.   Yes [provider]  aspirin 81 MG EC tablet Take by mouth.   Yes [provider]  atorvastatin (LIPITOR) 80 MG tablet Take 1 tablet (80 mg total) by mouth daily. Patient taking differently: Take 80 mg by mouth daily at 6 PM.  11/09/18  Yes Jettie Booze, MD  Bempedoic Acid-Ezetimibe (NEXLIZET) 180-10 MG TABS Take 1 tablet by mouth daily. 02/13/19  Yes Jettie Booze, MD  clopidogrel (PLAVIX) 75 MG tablet Take 1 tablet by mouth once daily Patient taking differently: Take 75 mg by mouth daily.  02/14/19  Yes Imogene Burn, PA-C  enoxaparin (LOVENOX) 40 MG/0.4ML injection Inject into the skin. 07/18/19  Yes [provider]  enoxaparin (LOVENOX) 40 MG/0.4ML injection Inject into the skin. 07/18/19  Yes [provider]  fluticasone (FLONASE) 50 MCG/ACT nasal spray Place 1 spray into both nostrils daily as needed for allergies or rhinitis.   Yes [provider]  hydrochlorothiazide (MICROZIDE) 12.5 MG capsule TAKE 1 CAPSULE BY MOUTH ONCE DAILY . APPOINTMENT REQUIRED FOR FUTURE REFILLS 06/21/19  Yes Wendie Agreste, MD  methocarbamol (ROBAXIN) 500 MG tablet Take by mouth. 07/18/19  Yes [provider]  methocarbamol (ROBAXIN) 500 MG tablet Take 500 mg by mouth 3 (three) times daily as needed. 07/18/19  Yes [provider]  metoprolol succinate (TOPROL-XL) 25 MG 24 hr tablet TAKE 1 TABLET BY MOUTH ONCE DAILY WITH  OR  IMMEDIATELY  FOLLOWING  A  MEAL Patient taking differently: Take 25 mg by mouth daily.  02/20/19  Yes Jettie Booze, MD  ondansetron (ZOFRAN) 4 MG tablet Take 1 tablet (4 mg total) by mouth every 8 (eight) hours as needed for nausea or vomiting. 07/13/19  Yes Melina Schools, MD  oxyCODONE-acetaminophen (PERCOCET/ROXICET) 5-325 MG tablet Take by mouth. 07/21/19  Yes [provider]   Social History   Socioeconomic History  . Marital status: Married    Spouse name: Not on file   . Number of children: Not on file  . Years of education: Not on file  . Highest education level: Not on file  Occupational History  . Not on file  Tobacco Use  . Smoking status: Former Smoker    Packs/day: 0.25    Types: Cigarettes    Quit date: 10/18/2017    Years since quitting: 1.7  . Smokeless tobacco: Never Used  Vaping Use  . Vaping Use: Never used  Substance and Sexual Activity  . Alcohol use: Yes    Alcohol/week: 6.0 standard drinks    Types: 6 Cans of beer per week  . Drug use: No  . Sexual activity: Yes  Other Topics Concern  . Not on file  Social History Narrative  . Not on file   Social Determinants of Health   Financial Resource Strain:   . Difficulty of Paying Living Expenses:   Food Insecurity:   . Worried About Charity fundraiser in the Last Year:   . [place] in the  Last Year:   Transportation Needs:   . Film/video editor (Medical):   Marland Kitchen Lack of Transportation (Non-Medical):   Physical Activity:   . Days of Exercise per Week:   . Minutes of Exercise per Session:   Stress:   . Feeling of Stress :   Social Connections:   . Frequency of Communication with Friends and Family:   . Frequency of Social Gatherings with Friends and Family:   . Attends Religious Services:   . Active Member of Clubs or Organizations:   . Attends Archivist Meetings:   Marland Kitchen Marital Status:   Intimate Partner Violence:   . Fear of Current or Ex-Partner:   . Emotionally Abused:   Marland Kitchen Physically Abused:   . Sexually Abused:      Observations/Objective: Vitals:   07/26/19 1215  BP: 123/87  Pulse: 96  Weight: 245 lb (111.1 kg)  Height: 6\' 4"  (1.93 m)   No distress on phone, talking normally.  No respiratory distress, no cough during exam/call  Assessment and Plan: Essential hypertension - Plan: hydrochlorothiazide (MICROZIDE) 12.5 MG capsule Stable based on home readings, continue same regimen, HCTZ refilled.  Cough has improved, RTC precautions  given.  In office follow-up discussed in 3 months.  Follow Up Instructions:   3 months, sooner if cough worsens I discussed the assessment and treatment plan with the patient. The patient was provided an opportunity to ask questions and all were answered. The patient agreed with the plan and demonstrated an understanding of the instructions.   The patient was advised to call back or seek an in-person evaluation if the symptoms worsen or if the condition fails to improve as anticipated.  I provided 11 minutes of non-face-to-face time during this encounter.  Signed,   Merri Ray, MD [facility].  07/26/19

## 2019-07-26 NOTE — Patient Instructions (Addendum)
° °  I'm glad to hear your cough has improved. Return to the clinic or go to the nearest emergency room if any of your symptoms worsen or new symptoms occur.  No change in blood pressure meds for now. 3 month follow up in office for labs and exam.   If you have lab work done today you will be contacted with your lab results within the next 2 weeks.  If you have not heard from [place] then please contact us. The fastest way to get your results is to register for My Chart.   IF you received an x-ray today, you will receive an invoice from [facility]. Please contact [facility] at [phone] with questions or concerns regarding your invoice.   IF you received labwork today, you will receive an invoice from [facility]. Please contact [facility] at [phone] with questions or concerns regarding your invoice.   Our billing staff will not be able to assist you with questions regarding bills from these companies.  You will be contacted with the lab results as soon as they are available. The fastest way to get your results is to activate your My Chart account. Instructions are located on the last page of this paperwork. If you have not heard from [place] regarding the results in 2 weeks, please contact this office.

## 2019-08-14 ENCOUNTER — Ambulatory Visit (HOSPITAL_COMMUNITY)
Admission: RE | Admit: 2019-08-14 | Discharge: 2019-08-14 | Disposition: A | Discharge status: Home / Self Care | Payer: 59 | Source: Ambulatory Visit | Attending: Orthopedic Surgery | Admitting: Orthopedic Surgery

## 2019-08-14 ENCOUNTER — Other Ambulatory Visit (HOSPITAL_COMMUNITY): Payer: Self-pay | Admitting: Orthopedic Surgery

## 2019-08-14 ENCOUNTER — Other Ambulatory Visit: Payer: Self-pay

## 2019-08-14 DIAGNOSIS — M79601 Pain in right arm: Secondary | ICD-10-CM | POA: Diagnosis not present

## 2019-08-14 DIAGNOSIS — M7989 Other specified soft tissue disorders: Secondary | ICD-10-CM | POA: Insufficient documentation

## 2019-08-14 NOTE — Progress Notes (Deleted)
Lower extremity venous RT completed. Left voicemail and return phone number with provider.   Darlin Coco

## 2019-08-15 ENCOUNTER — Encounter (HOSPITAL_COMMUNITY): Payer: 59

## 2019-11-10 ENCOUNTER — Other Ambulatory Visit: Payer: Self-pay | Admitting: Physician Assistant

## 2020-05-18 IMAGING — DX DG CHEST 2V
2 series · 2 of 2 positions shown · non-contrast
Comparison: Radiograph September 09, 2017.

CLINICAL DATA: Dyspnea on exertion.

EXAM:
CHEST - 2 VIEW

[chest pa]
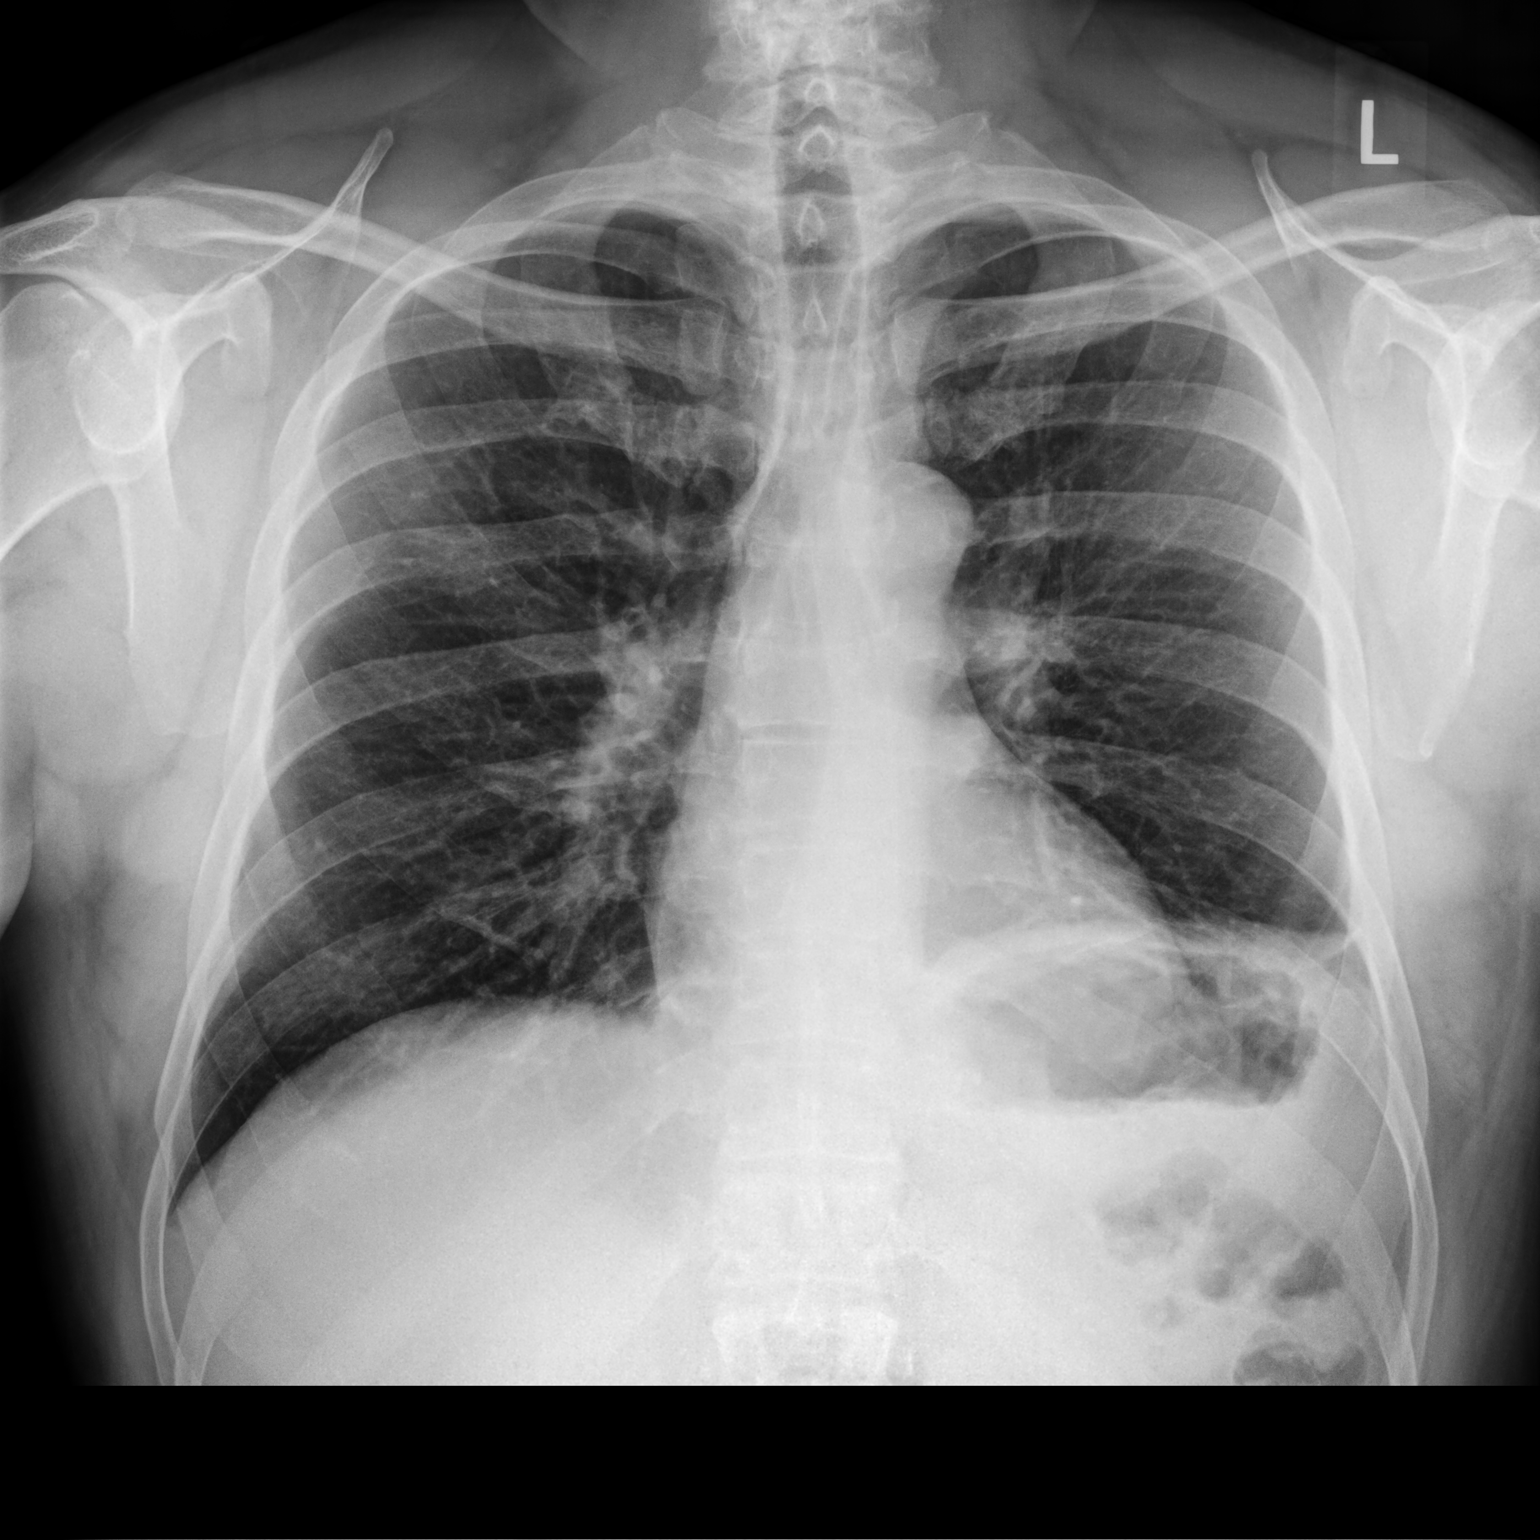

[chest lat]
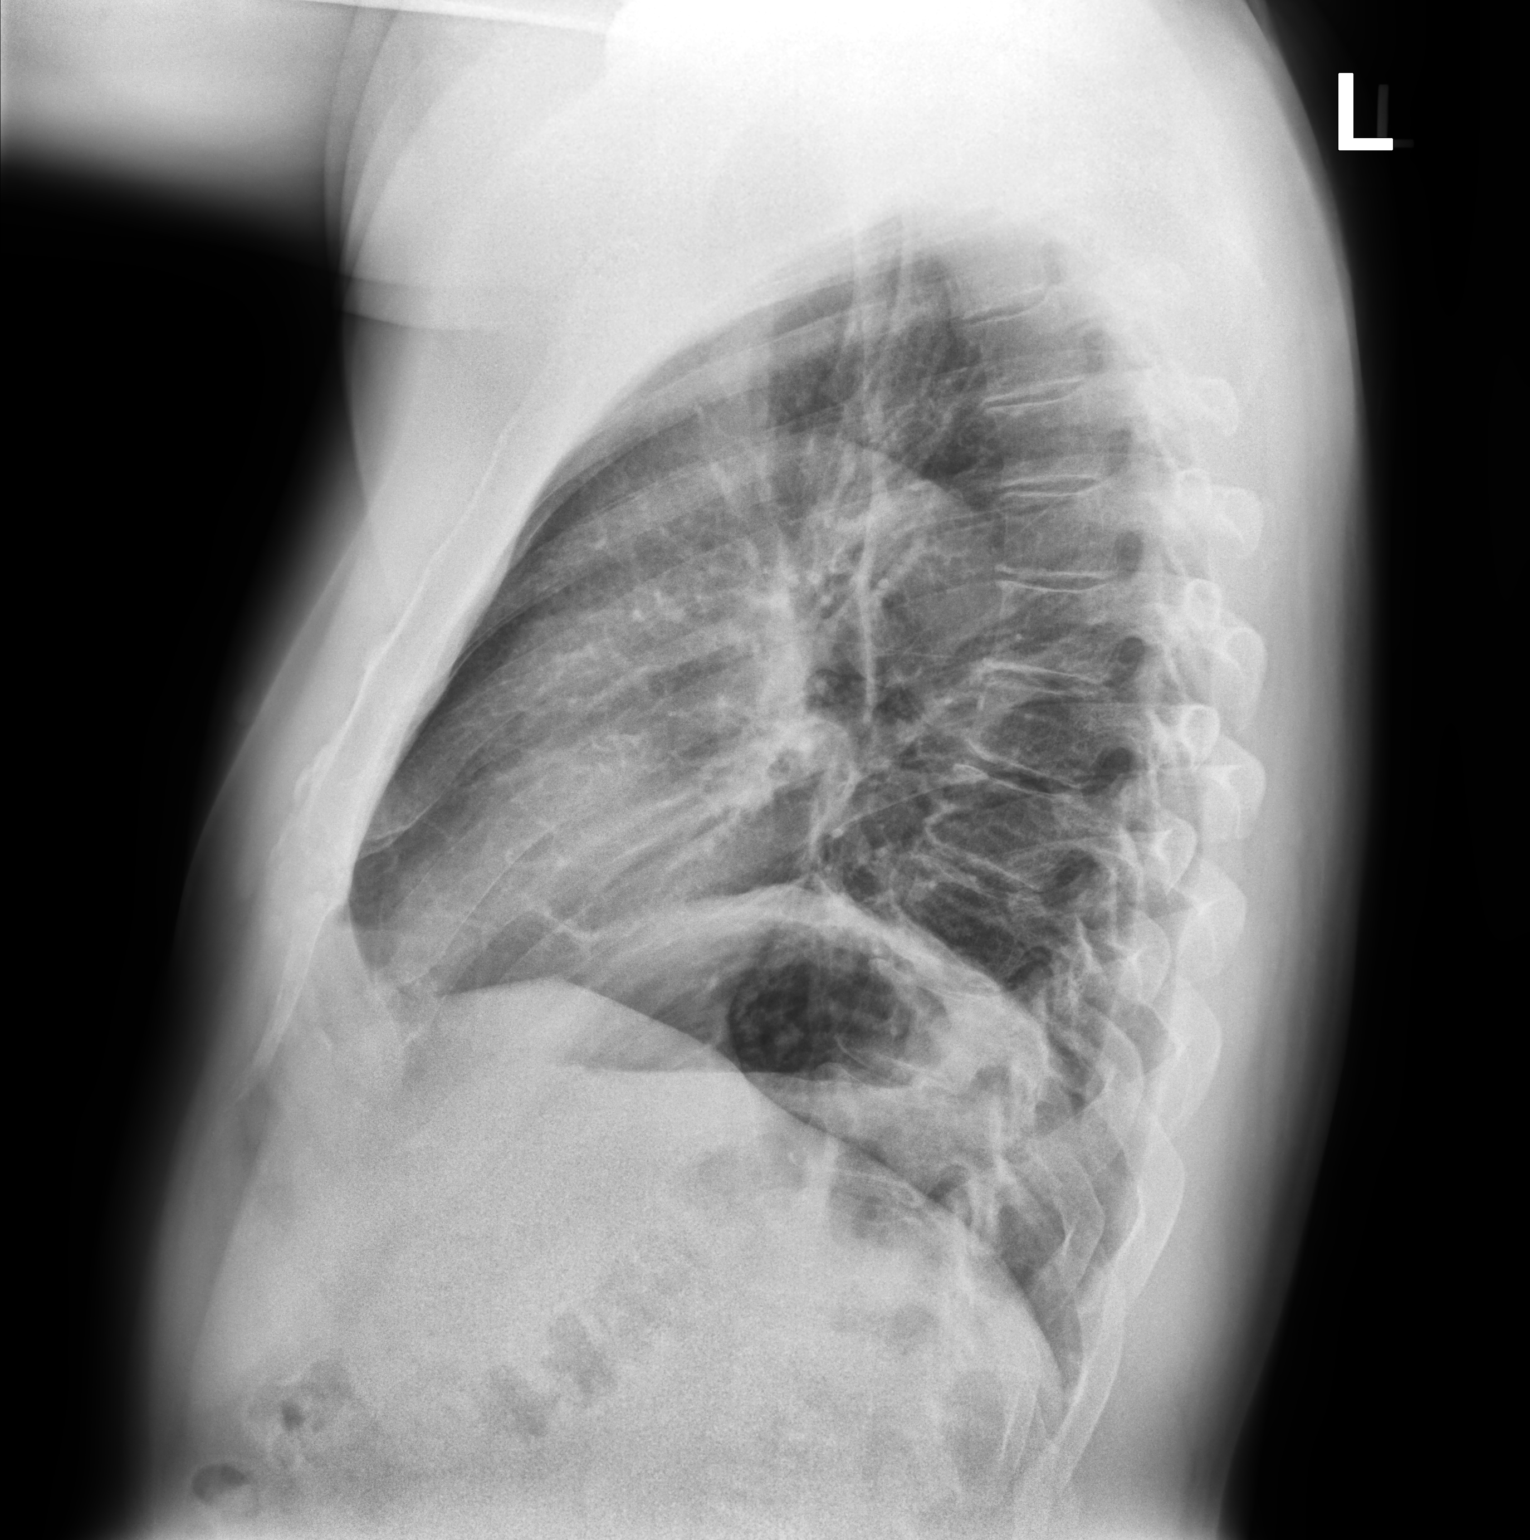

[2 of 2 positions shown; findings below may reference images not displayed]

FINDINGS: The heart size and mediastinal contours are within normal limits. No
pneumothorax or pleural effusion is noted. Elevated left
hemidiaphragm is noted with mild left basilar subsegmental
atelectasis. Right lung is clear. The visualized skeletal structures
are unremarkable.
IMPRESSION: Elevated left hemidiaphragm with mild left basilar subsegmental
atelectasis.

## 2020-09-25 ENCOUNTER — Other Ambulatory Visit: Payer: Self-pay | Admitting: Interventional Cardiology

## 2021-02-09 IMAGING — CR DG OR LOCAL ABDOMEN
1 series · 1 of 1 positions shown · non-contrast
Comparison: None.

CLINICAL DATA: Possible retained instruments from surgery.

EXAM:
OR LOCAL ABDOMEN

[AP]
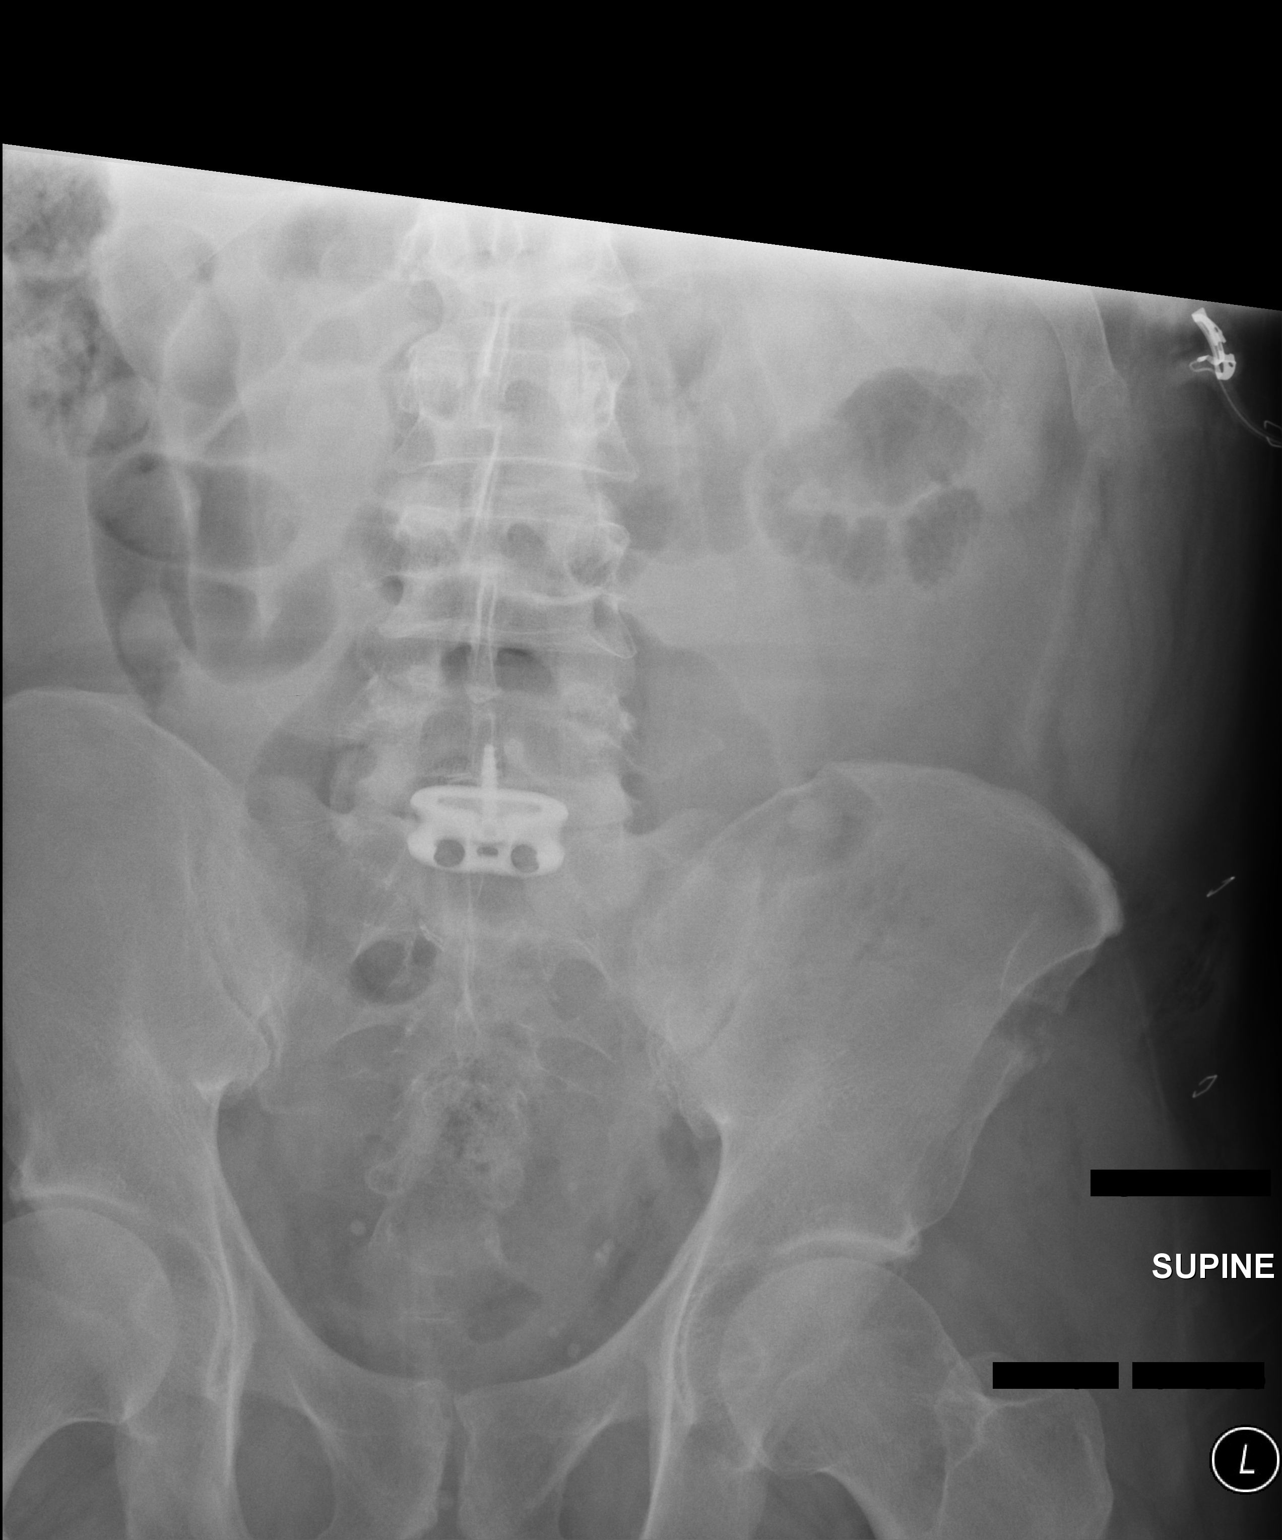

[1 of 1 positions shown; findings below may reference images not displayed]

FINDINGS: The bowel gas pattern is normal. Status post surgical anterior
fusion of L5-S1. No other radiopaque foreign body is noted.
Phleboliths are noted in the pelvis.
IMPRESSION: No evidence of bowel obstruction or ileus. Status post surgical
anterior fusion of L5-S1. No other radiopaque foreign body is noted.
These results were called by telephone at the time of interpretation
on 07/13/2019 at [DATE] to provider ZACHRIS TELES , who verbally
acknowledged these results.

## 2021-02-09 IMAGING — RF DG LUMBAR SPINE 2-3V
1 series · 2 of 2 positions shown · non-contrast
Comparison: None.

CLINICAL DATA: Anterior L5-S1 fusion

EXAM:
DG C-ARM 1-60 MIN; LUMBAR SPINE - 2-3 VIEW

[Series 1: run · 2 of 2 slices shown]
[im 1/2]
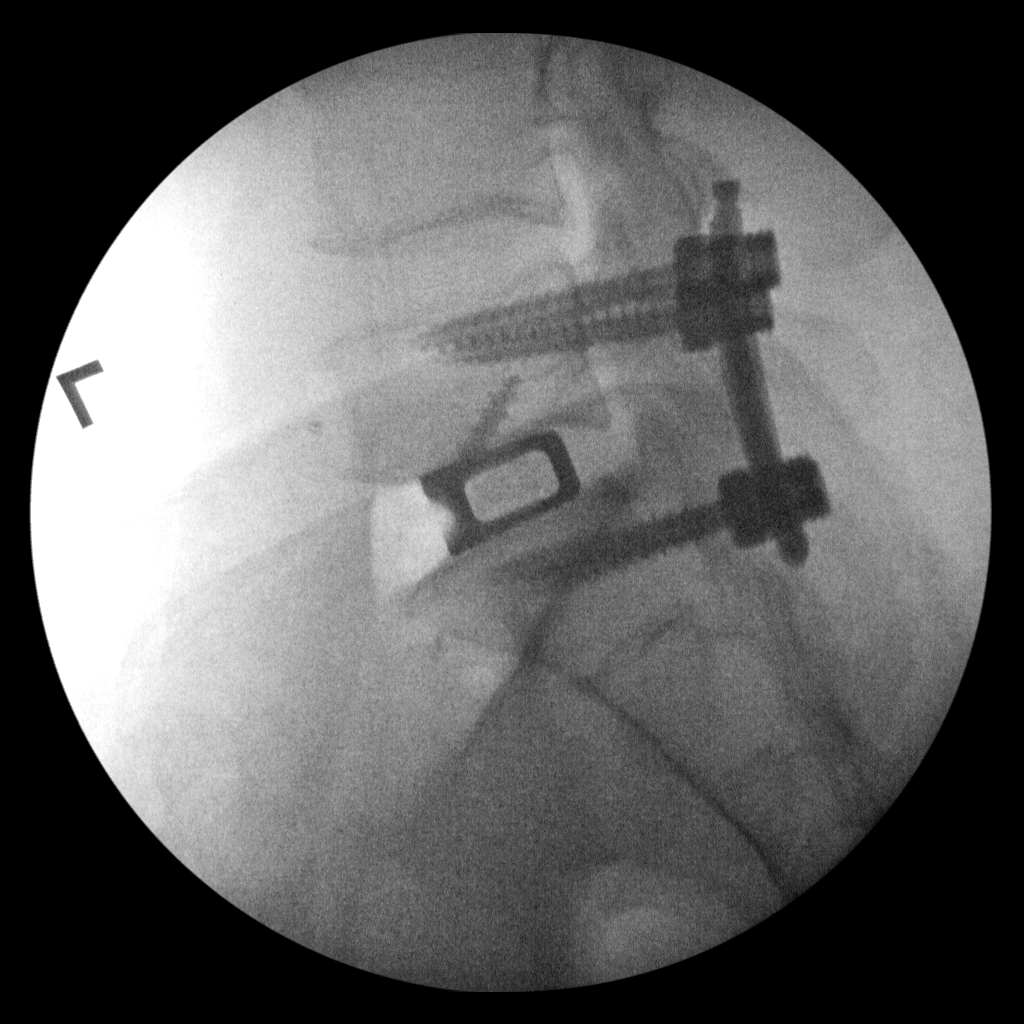
[im 2/2]
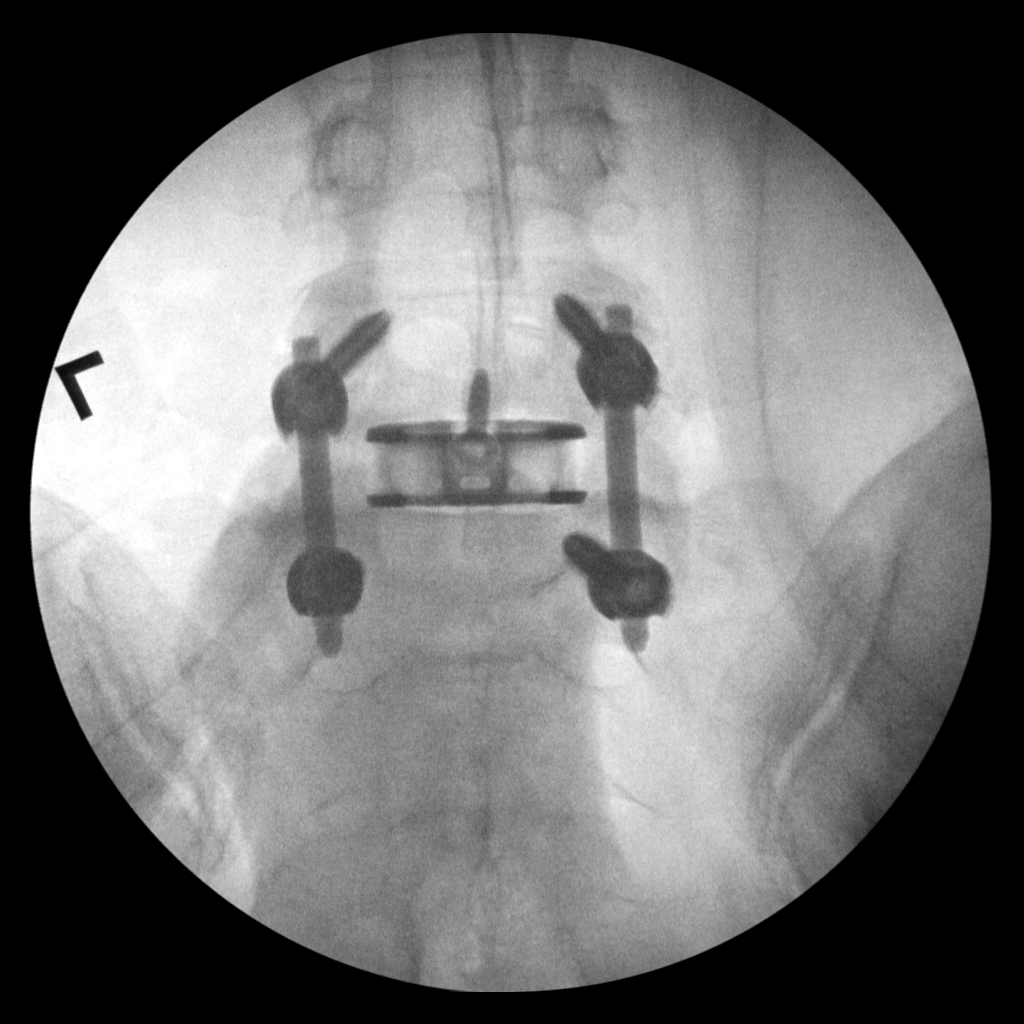

[2 of 2 positions shown; findings below may reference images not displayed]

FLUOROSCOPY TIME:  Fluoroscopy Time:  3 minutes 24 seconds

Radiation Exposure Index (if provided by the fluoroscopic device):
145.09 mGy

Number of Acquired Spot Images: 2
FINDINGS: Pedicle screws and posterior fixation are noted at L5-S1. Interval
anterior fusion device is now seen. No acute abnormality is noted.
IMPRESSION: Status post anterior L5-S1 fusion.

## 2022-09-19 ENCOUNTER — Other Ambulatory Visit: Payer: Self-pay

## 2022-09-19 ENCOUNTER — Encounter (HOSPITAL_COMMUNITY): Payer: Self-pay | Admitting: Emergency Medicine

## 2022-09-19 ENCOUNTER — Emergency Department (HOSPITAL_COMMUNITY): Payer: BC Managed Care – PPO

## 2022-09-19 ENCOUNTER — Emergency Department (HOSPITAL_COMMUNITY)
Admission: EM | Admit: 2022-09-19 | Discharge: 2022-09-19 | Disposition: A | Discharge status: Home / Self Care | Payer: BC Managed Care – PPO | Attending: Emergency Medicine | Admitting: Emergency Medicine

## 2022-09-19 DIAGNOSIS — J40 Bronchitis, not specified as acute or chronic: Secondary | ICD-10-CM | POA: Insufficient documentation

## 2022-09-19 DIAGNOSIS — D72829 Elevated white blood cell count, unspecified: Secondary | ICD-10-CM | POA: Diagnosis not present

## 2022-09-19 DIAGNOSIS — Z7982 Long term (current) use of aspirin: Secondary | ICD-10-CM | POA: Insufficient documentation

## 2022-09-19 DIAGNOSIS — Z1152 Encounter for screening for COVID-19: Secondary | ICD-10-CM | POA: Diagnosis not present

## 2022-09-19 DIAGNOSIS — R0602 Shortness of breath: Secondary | ICD-10-CM | POA: Diagnosis present

## 2022-09-19 LAB — BASIC METABOLIC PANEL
Anion gap: 13 (ref 5–15)
BUN: 14 mg/dL (ref 6–20)
CO2: 24 mmol/L (ref 22–32)
Calcium: 9 mg/dL (ref 8.9–10.3)
Chloride: 99 mmol/L (ref 98–111)
Creatinine, Ser: 1.02 mg/dL (ref 0.61–1.24)
GFR, Estimated: >60 mL/min (ref >60)
Glucose, Bld: 104 mg/dL — ABNORMAL HIGH (ref 70–99)
Potassium: 3.6 mmol/L (ref 3.5–5.1)
Sodium: 136 mmol/L (ref 135–145)

## 2022-09-19 LAB — CBC
HCT: 42.5 % (ref 39.0–52.0)
Hemoglobin: 14.3 g/dL (ref 13.0–17.0)
MCH: 31.2 pg (ref 26.0–34.0)
MCHC: 33.6 g/dL (ref 30.0–36.0)
MCV: 92.8 fL (ref 80.0–100.0)
Platelets: 311 10*3/uL (ref 150–400)
RBC: 4.58 MIL/uL (ref 4.22–5.81)
RDW: 12.4 % (ref 11.5–15.5)
WBC: 14.7 10*3/uL — ABNORMAL HIGH (ref 4.0–10.5)
nRBC: 0 % (ref 0.0–0.2)

## 2022-09-19 LAB — SARS CORONAVIRUS 2 BY RT PCR: SARS Coronavirus 2 by RT PCR: NEGATIVE

## 2022-09-19 MED ORDER — ALBUTEROL SULFATE HFA 108 (90 BASE) MCG/ACT IN AERS
2.0000 | INHALATION_SPRAY | RESPIRATORY_TRACT | Status: DC
  Administered 2022-09-19: 2 via RESPIRATORY_TRACT
  Filled 2022-09-19: qty 6.7

## 2022-09-19 MED ORDER — PREDNISONE 50 MG PO TABS: ORAL_TABLET | ORAL | 0 refills | Status: DC

## 2022-09-19 MED ORDER — LACTATED RINGERS IV BOLUS
1000.0000 mL | Freq: Once | INTRAVENOUS | Status: AC
  Administered 2022-09-19: 1000 mL via INTRAVENOUS

## 2022-09-19 MED ORDER — LACTATED RINGERS IV SOLN: INTRAVENOUS | Status: DC

## 2022-09-19 MED ORDER — PREDNISONE 20 MG PO TABS
60.0000 mg | ORAL_TABLET | Freq: Once | ORAL | Status: AC
  Administered 2022-09-19: 60 mg via ORAL
  Filled 2022-09-19: qty 3

## 2022-09-19 MED ORDER — IOHEXOL 350 MG/ML SOLN
75.0000 mL | Freq: Once | INTRAVENOUS | Status: AC | PRN
  Administered 2022-09-19: 75 mL via INTRAVENOUS

## 2022-09-19 NOTE — ED Notes (Addendum)
Pt ambulated from room down to room 25 and back to room with pulse-ox; O2Sat remained 90% consistently through walk, dropped to 86% upon returning to bed.

## 2022-09-19 NOTE — ED Triage Notes (Signed)
Pt reports shortness of breath, chest pain, and cough that started yesterday. Pt also reporting diaphoresis and fever at home.

## 2022-09-19 NOTE — ED Provider Notes (Signed)
[place] EMERGENCY DEPARTMENT AT [facility] Provider Note   CSN: [id number] Arrival date & time: 09/19/22  1747     History  Chief Complaint  Patient presents with   Shortness of Breath    Lee Krause is a 57 y.o. male.  57 year old male presents with sudden onset of cough and shortness of breath which began yesterday.  States that he he has had URI symptoms that been getting worse.  No reported fever, diarrhea.  Did have some posttussive emesis.  Unknown sick exposures.  No abdominal discomfort.  No anginal or CHF type symptoms.       Home Medications Prior to Admission medications   Medication Sig Start Date End Date Taking? Authorizing Provider  amLODipine (NORVASC) 10 MG tablet Take 1 tablet (10 mg total) by mouth daily. 10/13/18   Corky Crafts, MD  amLODipine (NORVASC) 5 MG tablet Take 5 mg by mouth daily.    [provider]  aspirin 81 MG EC tablet Take by mouth.    [provider]  atorvastatin (LIPITOR) 80 MG tablet Take 1 tablet (80 mg total) by mouth daily. Patient taking differently: Take 80 mg by mouth daily at 6 PM.  11/09/18   Corky Crafts, MD  Bempedoic Acid-Ezetimibe (NEXLIZET) 180-10 MG TABS Take 1 tablet by mouth daily. 02/13/19   Corky Crafts, MD  clopidogrel (PLAVIX) 75 MG tablet Take 1 tablet (75 mg total) by mouth daily. Please schedule appointment for future refills. 1st attempt. Thank you 11/10/19   Corky Crafts, MD  enoxaparin (LOVENOX) 40 MG/0.4ML injection Inject into the skin. 07/18/19   [provider]  enoxaparin (LOVENOX) 40 MG/0.4ML injection Inject into the skin. 07/18/19   [provider]  fluticasone (FLONASE) 50 MCG/ACT nasal spray Place 1 spray into both nostrils daily as needed for allergies or rhinitis.    [provider]  hydrochlorothiazide (MICROZIDE) 12.5 MG capsule TAKE 1 CAPSULE BY MOUTH ONCE DAILY . 07/26/19   Shade Flood, MD  methocarbamol  (ROBAXIN) 500 MG tablet Take by mouth. 07/18/19   [provider]  methocarbamol (ROBAXIN) 500 MG tablet Take 500 mg by mouth 3 (three) times daily as needed. 07/18/19   [provider]  metoprolol succinate (TOPROL-XL) 25 MG 24 hr tablet TAKE 1 TABLET BY MOUTH ONCE DAILY WITH  OR  IMMEDIATELY  FOLLOWING  A  MEAL Patient taking differently: Take 25 mg by mouth daily.  02/20/19   Corky Crafts, MD  ondansetron (ZOFRAN) 4 MG tablet Take 1 tablet (4 mg total) by mouth every 8 (eight) hours as needed for nausea or vomiting. 07/13/19   Venita Lick, MD  oxyCODONE-acetaminophen (PERCOCET/ROXICET) 5-325 MG tablet Take by mouth. 07/21/19   [provider]      Allergies    Nsaids    Review of Systems   Review of Systems  All other systems reviewed and are negative.   Physical Exam Updated Vital Signs BP (!) 152/103   Pulse (!) 106   Temp 98.9 F (37.2 C) (Oral)   Resp (!) 28   SpO2 93%  Physical Exam Vitals and nursing note reviewed.  Constitutional:      General: He is not in acute distress.    Appearance: Normal appearance. He is well-developed. He is not toxic-appearing.  HENT:     Head: Normocephalic and atraumatic.  Eyes:     General: Lids are normal.     Conjunctiva/sclera: Conjunctivae normal.  Pupils: Pupils are equal, round, and reactive to light.  Neck:     Thyroid: No thyroid mass.     Trachea: No tracheal deviation.  Cardiovascular:     Rate and Rhythm: Normal rate and regular rhythm.     Heart sounds: Normal heart sounds. No murmur heard.    No gallop.  Pulmonary:     Effort: Pulmonary effort is normal. No respiratory distress.     Breath sounds: Normal breath sounds. No stridor. No decreased breath sounds, wheezing, rhonchi or rales.  Abdominal:     General: There is no distension.     Palpations: Abdomen is soft.     Tenderness: There is no abdominal tenderness. There is no rebound.  Musculoskeletal:        General: No tenderness.  Normal range of motion.     Cervical back: Normal range of motion and neck supple.  Skin:    General: Skin is warm and dry.     Findings: No abrasion or rash.  Neurological:     Mental Status: He is alert and oriented to person, place, and time. Mental status is at baseline.     GCS: GCS eye subscore is 4. GCS verbal subscore is 5. GCS motor subscore is 6.     Cranial Nerves: Cranial nerves are intact. No cranial nerve deficit.     Sensory: No sensory deficit.     Motor: Motor function is intact.  Psychiatric:        Attention and Perception: Attention normal.        Speech: Speech normal.        Behavior: Behavior normal.     ED Results / Procedures / Treatments   Labs (all labs ordered are listed, but only abnormal results are displayed) Labs Reviewed  BASIC METABOLIC PANEL - Abnormal; Notable for the following components:      Result Value   Glucose, Bld 104 (*)    All other components within normal limits  CBC - Abnormal; Notable for the following components:   WBC 14.7 (*)    All other components within normal limits  SARS CORONAVIRUS 2 BY RT PCR    EKG EKG Interpretation Date/Time:  Saturday September 19 2022 17:52:15 EDT Ventricular Rate:  111 PR Interval:  133 QRS Duration:  84 QT Interval:  322 QTC Calculation: 438 R Axis:   69  Text Interpretation: Sinus tachycardia Borderline T abnormalities, lateral leads Confirmed by Lorre Nick ([id number]) on 09/19/2022 8:02:11 PM  Radiology DG Chest 2 View  Result Date: 09/19/2022 CLINICAL DATA:  Chest pain EXAM: CHEST - 2 VIEW COMPARISON:  None Available. FINDINGS: The heart size and mediastinal contours are within normal limits. Both lungs are clear. The visualized skeletal structures are unremarkable. IMPRESSION: No active cardiopulmonary disease. Electronically Signed   By: Helyn Numbers M.D.   On: 09/19/2022 19:10    Procedures Procedures    Medications Ordered in ED Medications - No data to display  ED Course/  Medical Decision Making/ A&P                                 Medical Decision Making Amount and/or Complexity of Data Reviewed Labs: ordered. Radiology: ordered.  Risk Prescription drug management.   Patient is EKG per my interpretation shows sinus rhythm.  Chest x-ray without acute findings here.  Patient's COVID test is negative while.  Labs show mild leukocytosis of 14,000.  Patient was short of breath concern for possible PE and patient had a CT of the chest which was negative for PE per my interpretation but consistent with bronchitis.  Will place patient on steroids and discharge        Final Clinical Impression(s) / ED Diagnoses Final diagnoses:  None    Rx / DC Orders ED Discharge Orders     None         Lorre Nick, MD 09/19/22 2212

## 2022-10-03 ENCOUNTER — Encounter: Payer: Self-pay | Admitting: Internal Medicine

## 2023-05-10 ENCOUNTER — Other Ambulatory Visit: Payer: Self-pay

## 2023-07-22 ENCOUNTER — Encounter: Payer: Self-pay | Admitting: Nurse Practitioner

## 2023-09-16 ENCOUNTER — Ambulatory Visit: Admitting: Nurse Practitioner

## 2023-09-17 ENCOUNTER — Ambulatory Visit: Admitting: Nurse Practitioner

## 2023-09-17 ENCOUNTER — Encounter: Payer: Self-pay | Admitting: Nurse Practitioner

## 2023-09-17 ENCOUNTER — Telehealth: Payer: Self-pay

## 2023-09-17 VITALS — BP 118/80 | HR 77 | Ht 76.0 in | Wt 230.0 lb

## 2023-09-17 DIAGNOSIS — K625 Hemorrhage of anus and rectum: Secondary | ICD-10-CM

## 2023-09-17 DIAGNOSIS — Z860101 Personal history of adenomatous and serrated colon polyps: Secondary | ICD-10-CM

## 2023-09-17 DIAGNOSIS — Z860100 Personal history of colon polyps, unspecified: Secondary | ICD-10-CM

## 2023-09-17 DIAGNOSIS — Z7901 Long term (current) use of anticoagulants: Secondary | ICD-10-CM | POA: Diagnosis not present

## 2023-09-17 MED ORDER — NA SULFATE-K SULFATE-MG SULF 17.5-3.13-1.6 GM/177ML PO SOLN
1.0000 | Freq: Once | ORAL | 0 refills | Status: AC
Start: 2023-09-17 — End: 2023-09-17

## 2023-09-17 NOTE — Telephone Encounter (Signed)
 Plavix  clearance letter faxed to Dr Fredrica for Colonoscopy scheduled 10/21/23.

## 2023-09-17 NOTE — Patient Instructions (Addendum)
 Apply a small amount of Desitin inside the anal opening and to the external anal area three times daily as needed for anal or hemorrhoidal irritation/bleeding.   Please contact our office if your rectal bleeding worsens prior to your colonoscopy date  You will be contacted by our office prior to your procedure for directions on holding your Plavix .  If you do not hear from our office 1 week prior to your scheduled procedure, please call [phone] to discuss.   You have been scheduled for a colonoscopy. Please follow written instructions given to you at your visit today.   If you use inhalers (even only as needed), please bring them with you on the day of your procedure.  DO NOT TAKE 7 DAYS PRIOR TO TEST- Trulicity (dulaglutide) Ozempic, Wegovy (semaglutide) Mounjaro (tirzepatide) Bydureon Bcise (exanatide extended release)  DO NOT TAKE 1 DAY PRIOR TO YOUR TEST Rybelsus (semaglutide) Adlyxin (lixisenatide) Victoza (liraglutide) Byetta (exanatide) ___________________________________________________________________________  _______________________________________________________  If your blood pressure at your visit was 140/90 or greater, please contact your primary care physician to follow up on this.  _______________________________________________________  If you are age 56 or older, your body mass index should be between 23-30. Your Body mass index is 28 kg/m. If this is out of the aforementioned range listed, please consider follow up with your Primary Care Provider.  If you are age 24 or younger, your body mass index should be between 19-25. Your Body mass index is 28 kg/m. If this is out of the aformentioned range listed, please consider follow up with your Primary Care Provider.   ________________________________________________________  The [facility] providers would like to encourage you to use MYCHART to communicate with providers for non-urgent requests or questions.   Due to long hold times on the telephone, sending your provider a message by [facility] may be a faster and more efficient way to get a response.  Please allow 48 business hours for a response.  Please remember that this is for non-urgent requests.  _______________________________________________________  [facility] is using a team-based approach to care.  Your team is made up of your doctor and two to three APPS. Our APPS (Nurse Practitioners and Physician Assistants) work with your physician to ensure care continuity for you. They are fully qualified to address your health concerns and develop a treatment plan. They communicate directly with your gastroenterologist to care for you. Seeing the Advanced Practice Practitioners on your physician's team can help you by facilitating care more promptly, often allowing for earlier appointments, access to diagnostic testing, procedures, and other specialty referrals.

## 2023-09-17 NOTE — Progress Notes (Signed)
 09/17/2023 Lee Krause [id number] 1965-04-24   CHIEF COMPLAINT: Schedule a colonoscopy  HISTORY OF PRESENT ILLNESS: Lee Krause is a 58 year old male with a past medical history of arthritis, anxiety, hypertension, hyperlipidemia, nonobstructive CAD, GERD, diverticulosis and colon polyps. S/P lumbar fusion L5-S1 07/2019. He presents to our office today to schedule a colonoscopy.  He denies having any upper or lower abdominal pain.  He occasionally sees a small amount of bright red blood on the stool which occurs once or twice monthly without draining or constipation.  No associated anorectal pain.  He remains on Plavix  and ASA secondary to nonobstructive CAD, multiple risk factors for CAD and significant family history of CAD including a brother who died from a MI at the age of 61.  He denies having any chest pain, palpitations or shortness of breath.  He has a history of GERD in the past which resolved after he reduced alcohol intake.  He no longer drinks shots of liquor and drinks 4 beers most evenings. He quit smoking cigarettes 2 to 3 years ago.  No known family history of colon polyps or colorectal cancer.  EGD 10/2017 was normal.  Colonoscopy 10/2017 identified 2 tubular adenomatous polyps removed from the ascending colon, diverticulosis and internal hemorrhoids.  Labs 06/14/2023: WBC 9.4.  Hemoglobin 14.2.  Hematocrit 42.8.  Platelets 352.  Glucose 111.  BUN 15.  Creatinine 0.82.  Sodium 138.  Potassium 4.6.  Total bili 0.5.  Alk phos 86.  AST 27.  ALT 24.  Stress ECHO 02/2021: Negative for ischemia. LV EF 60 - 63%.   Coronary CTA 08/11/2023:  Calcium  score of 640.5.  This places the patient in the 99th percentile for age and race based on the [facility] data base.  There is no significant coronary artery stenosis.    PAST GI PROCEDURES:    EGD 10/14/2017: - Normal esophagus.  - Normal stomach.  - Normal examined duodenum.  - No specimens collected.  Colonoscopy 10/14/2017: -  Two 2 to 4 mm polyps in the ascending colon, removed with a cold snare. Resected and retrieved. - Diverticulosis in the transverse colon and in the left colon. - Internal hemorrhoids. - The examination was otherwise normal on direct and retroflexion views. - 5 year recall colonoscopy  Surgical [P], ascending, polyp - TUBULAR ADENOMA (ONE). - NO HIGH GRADE DYSPLASIA OR MALIGNANCY.  Past Medical History:  Diagnosis Date   Allergy    Anemia    Angina at rest    Arthritis    Chest pain    Gastritis    Gastrointestinal hemorrhage    GERD (gastroesophageal reflux disease)    Hyperlipidemia    Hypertension    Tobacco abuse    Past Surgical History:  Procedure Laterality Date   ABDOMINAL EXPOSURE N/A 07/13/2019   Procedure: ABDOMINAL EXPOSURE;  Surgeon: Serene Gaile ORN, MD;  Location: [facility] OR;  Service: Vascular;  Laterality: N/A;   ANTERIOR LUMBAR FUSION N/A 07/13/2019   Procedure: ANTERIOR LUMBAR FUSION (ALIF) L5-S1;  Surgeon: Burnetta Aures, MD;  Location: [facility] OR;  Service: Orthopedics;  Laterality: N/A;   CARDIAC CATHETERIZATION     CORONARY PRESSURE/FFR STUDY N/A 09/24/2017   Procedure: INTRAVASCULAR PRESSURE WIRE/FFR STUDY;  Surgeon: Claudene Victory ORN, MD;  Location: [facility] INVASIVE CV [facility];  Service: Cardiovascular;  Laterality: N/A;   HARVEST BONE GRAFT Left 07/13/2019   Procedure: HARVEST ILIAC BONE GRAFT;  Surgeon: Burnetta Aures, MD;  Location: [facility] OR;  Service: Orthopedics;  Laterality: Left;   LEFT HEART CATH AND CORONARY ANGIOGRAPHY N/A 09/24/2017   Procedure: LEFT HEART CATH AND CORONARY ANGIOGRAPHY;  Surgeon: Claudene Victory ORN, MD;  Location: [facility];  Service: Cardiovascular;  Laterality: N/A;   TOOTH EXTRACTION     Social History: He is married.  Quit smoking cigarettes 2 to 3 years ago.  He drinks 4 beers daily.  No drug use.  Family History: family history of early CAD with brother dying of an MI at age 54. Mother and father with history of CAD/MI.  No known family history of  esophageal, gastric or colon cancer.  Allergies  Allergen Reactions   Nsaids     Internal bleeding      Outpatient Encounter Medications as of 09/17/2023  Medication Sig   amLODipine  (NORVASC ) 10 MG tablet Take 1 tablet (10 mg total) by mouth daily.   amLODipine  (NORVASC ) 5 MG tablet Take 5 mg by mouth daily.   aspirin  81 MG EC tablet Take by mouth.   atorvastatin  (LIPITOR ) 80 MG tablet Take 1 tablet (80 mg total) by mouth daily. (Patient taking differently: Take 80 mg by mouth daily at 6 PM. )   Bempedoic Acid -Ezetimibe  (NEXLIZET ) 180-10 MG TABS Take 1 tablet by mouth daily.   clopidogrel  (PLAVIX ) 75 MG tablet Take 1 tablet (75 mg total) by mouth daily. Please schedule appointment for future refills. 1st attempt. Thank you   enoxaparin  (LOVENOX ) 40 MG/0.4ML injection Inject into the skin.   enoxaparin  (LOVENOX ) 40 MG/0.4ML injection Inject into the skin.   fluticasone (FLONASE) 50 MCG/ACT nasal spray Place 1 spray into both nostrils daily as needed for allergies or rhinitis.   hydrochlorothiazide  (MICROZIDE ) 12.5 MG capsule TAKE 1 CAPSULE BY MOUTH ONCE DAILY .   methocarbamol  (ROBAXIN ) 500 MG tablet Take by mouth.   methocarbamol  (ROBAXIN ) 500 MG tablet Take 500 mg by mouth 3 (three) times daily as needed.   metoprolol  succinate (TOPROL -XL) 25 MG 24 hr tablet TAKE 1 TABLET BY MOUTH ONCE DAILY WITH  OR  IMMEDIATELY  FOLLOWING  A  MEAL (Patient taking differently: Take 25 mg by mouth daily. )   ondansetron  (ZOFRAN ) 4 MG tablet Take 1 tablet (4 mg total) by mouth every 8 (eight) hours as needed for nausea or vomiting.   oxyCODONE -acetaminophen  (PERCOCET/ROXICET) 5-325 MG tablet Take by mouth.   predniSONE  (DELTASONE ) 50 MG tablet 1 p.o. daily x 4   No facility-administered encounter medications on file as of 09/17/2023.   REVIEW OF SYSTEMS:  Gen: + Fatigue.Denies fever, sweats or chills. No weight loss.  CV: Denies chest pain, palpitations or edema. Resp: Denies cough, shortness of breath  of hemoptysis.  GI: See HPI. GU: Denies urinary burning, blood in urine, increased urinary frequency or incontinence. MS: + Arthritis and back pain. Derm: Denies rash, itchiness, skin lesions or unhealing ulcers. Psych: + Anxiety.  Heme: Denies bruising, easy bleeding. Neuro:  Denies headaches, dizziness or paresthesias. Endo:  Denies any problems with DM, thyroid or adrenal function.  PHYSICAL EXAM: BP 118/80   Pulse 77   Ht 6' 4 (1.93 m)   Wt 230 lb (104.3 kg)   BMI 28.00 kg/m   General: 58 year old male in no acute distress. Head: Normocephalic and atraumatic. Eyes:  Sclerae non-icteric, conjunctive pink. Ears: Normal auditory acuity. Mouth: Dentition intact. No ulcers or lesions.  Neck: Supple, no lymphadenopathy or thyromegaly.  Lungs: Clear bilaterally to auscultation without wheezes, crackles or rhonchi. Heart: Regular rate and rhythm. No murmur, rub  or gallop appreciated.  Abdomen: Soft, nontender, nondistended. No masses. No hepatosplenomegaly. Normoactive bowel sounds x 4 quadrants.  Rectal: Patient declined rectal exam, wishes to proceed with colonoscopy for further evaluation. Musculoskeletal: Symmetrical with no gross deformities. Skin: Warm and dry. No rash or lesions on visible extremities. Extremities: No edema. Neurological: Alert oriented x 4, no focal deficits.  Psychological: Alert and cooperative. Normal mood and affect.  ASSESSMENT AND PLAN:  58 year old male with a history of tubular adenomatous polyps per colonoscopy 10/2017 and intermittent bright red rectal bleeding presents to schedule a colonoscopy.  On Plavix  and ASA. -Colonoscopy benefits and risks discussed including risk with sedation, risk of bleeding, perforation and infection  - Our office will contact the patient's cardiologist Dr. Youlanda Juba to verify Plavix  hold instructions prior to proceeding with a colonoscopy - Apply a small amount of Desitin inside the anal opening and to the  external anal area three times daily as needed for anal or hemorrhoidal irritation/bleeding.  - Patient to contact office if rectal bleeding worsens prior to colonoscopy date  Prior history of GERD, asymptomatic.  Reflux symptoms abated after he reduced alcohol intake.  Normal EGD in 2019. - Patient encouraged to further reduce alcohol intake - Patient to contact office if GERD symptoms recur  Nonobstructive CAD per cardiac cath 09/2017. Coronary CTA 08/11/2023 showed a calcium  score of 640.5. (This places the patient in the 99th percentile for age and race based on the [facility] data base). No significant coronary artery stenosis. Significant family history of CAD. On Plavix  and ASA. No angina.  -Patient will contact our office if he has any change in his cardiac status prior to his colonoscopy date - See request regarding Plavix  hold instructions above      CC:  Levora Reyes SAUNDERS, MD

## 2023-09-21 NOTE — Progress Notes (Signed)
 Noted

## 2023-10-01 NOTE — Telephone Encounter (Signed)
 Letter faxed a 2nd time 10/01/23

## 2023-10-07 NOTE — Telephone Encounter (Signed)
 Lee Krause, thanks for the follow up. Do we have an official copy of Plavix  hold instructions from his cardiologist?   Pls send patient to our lab 1 day prior to his procedure to check a BMP and magnesium  level since his cardiologist wanted his potassium and magnesium  levels monitored.

## 2023-10-07 NOTE — Telephone Encounter (Signed)
 Noted

## 2023-10-07 NOTE — Telephone Encounter (Addendum)
 Received clearance letter from cards to hold Plavix  5 days prior to procedure. Pt informed via VM.   Dr Fredrica also requested Please keep blood pressure and heart rate stable throughout case. Please keep potassium between 4-5 and magnesium  between 2.0-2.5. Please let patient know when to resume Plavix .  Electronically signed:  Corean Amsterdam, NCMA

## 2023-10-08 NOTE — Addendum Note (Signed)
 Addended by: Akiva Brassfield E on: 10/08/2023 01:05 PM   Modules accepted: Orders

## 2023-10-08 NOTE — Telephone Encounter (Signed)
 I spoke to Lee Krause and he will come for lab work on 10/20/2023. He is aware of the lab hours. Orders in epic.   Lee Krause had to leave early today so we will check with her next week about the clearance letter. It may have been sent to be scanned into epic and that can take time to appear.

## 2023-10-11 NOTE — Telephone Encounter (Signed)
 Clearance letter was sent to be scanned into chart last week.

## 2023-10-14 ENCOUNTER — Encounter: Payer: Self-pay | Admitting: Internal Medicine

## 2023-10-20 ENCOUNTER — Other Ambulatory Visit

## 2023-10-20 DIAGNOSIS — K625 Hemorrhage of anus and rectum: Secondary | ICD-10-CM

## 2023-10-20 LAB — BASIC METABOLIC PANEL WITH GFR
BUN: 16 mg/dL (ref 6–23)
CO2: 25 meq/L (ref 19–32)
Calcium: 8.8 mg/dL (ref 8.4–10.5)
Chloride: 105 meq/L (ref 96–112)
Creatinine, Ser: 0.89 mg/dL (ref 0.40–1.50)
GFR: 96.25 mL/min (ref >60.00)
Glucose, Bld: 114 mg/dL — ABNORMAL HIGH (ref 70–99)
Potassium: 4.1 meq/L (ref 3.5–5.1)
Sodium: 140 meq/L (ref 135–145)

## 2023-10-20 LAB — MAGNESIUM: Magnesium: 1.9 mg/dL (ref 1.5–2.5)

## 2023-10-21 ENCOUNTER — Encounter: Payer: Self-pay | Admitting: Internal Medicine

## 2023-10-21 ENCOUNTER — Ambulatory Visit: Admitting: Internal Medicine

## 2023-10-21 VITALS — BP 129/81 | HR 65 | Temp 97.3°F | Resp 18 | Ht 76.0 in | Wt 230.0 lb

## 2023-10-21 DIAGNOSIS — D123 Benign neoplasm of transverse colon: Secondary | ICD-10-CM | POA: Diagnosis not present

## 2023-10-21 DIAGNOSIS — K625 Hemorrhage of anus and rectum: Secondary | ICD-10-CM

## 2023-10-21 DIAGNOSIS — K635 Polyp of colon: Secondary | ICD-10-CM | POA: Diagnosis not present

## 2023-10-21 DIAGNOSIS — D125 Benign neoplasm of sigmoid colon: Secondary | ICD-10-CM

## 2023-10-21 DIAGNOSIS — K648 Other hemorrhoids: Secondary | ICD-10-CM | POA: Diagnosis not present

## 2023-10-21 DIAGNOSIS — K5730 Diverticulosis of large intestine without perforation or abscess without bleeding: Secondary | ICD-10-CM | POA: Diagnosis not present

## 2023-10-21 DIAGNOSIS — Z1211 Encounter for screening for malignant neoplasm of colon: Secondary | ICD-10-CM | POA: Diagnosis present

## 2023-10-21 DIAGNOSIS — Z860101 Personal history of adenomatous and serrated colon polyps: Secondary | ICD-10-CM

## 2023-10-21 DIAGNOSIS — D122 Benign neoplasm of ascending colon: Secondary | ICD-10-CM

## 2023-10-21 DIAGNOSIS — Z860100 Personal history of colon polyps, unspecified: Secondary | ICD-10-CM

## 2023-10-21 MED ORDER — SODIUM CHLORIDE 0.9 % IV SOLN: 500.0000 mL | Freq: Once | INTRAVENOUS | Status: DC

## 2023-10-21 NOTE — Op Note (Signed)
 [facility] Patient Name: Lee Krause Procedure Date: 10/21/2023 11:47 AM MRN: [id number] Endoscopist: Norleen SAILOR. Abran , MD, [id number] Age: 58 Referring MD:  Date of Birth: November 07, 1965 Gender: Male Account #: [id number] Procedure:                Colonoscopy with cold snare polypectomy x 3 Indications:              High risk colon cancer surveillance: Personal                            history of multiple adenomas (2019) Medicines:                Monitored Anesthesia Care Procedure:                Pre-Anesthesia Assessment:                           - Prior to the procedure, a History and Physical                            was performed, and patient medications and                            allergies were reviewed. The patient's tolerance of                            previous anesthesia was also reviewed. The risks                            and benefits of the procedure and the sedation                            options and risks were discussed with the patient.                            All questions were answered, and informed consent                            was obtained. Prior Anticoagulants: The patient has                            taken Plavix  (clopidogrel ), last dose was 7 days                            prior to procedure. ASA Grade Assessment: III - A                            patient with severe systemic disease. After                            reviewing the risks and benefits, the patient was                            deemed in satisfactory condition to undergo the  procedure.                           After obtaining informed consent, the colonoscope                            was passed under direct vision. Throughout the                            procedure, the patient's blood pressure, pulse, and                            oxygen saturations were monitored continuously. The                            Olympus Scope SN:  [id number] was introduced through                            the anus and advanced to the the cecum, identified                            by appendiceal orifice and ileocecal valve. The                            ileocecal valve, appendiceal orifice, and rectum                            were photographed. The quality of the bowel                            preparation was excellent. The colonoscopy was                            performed without difficulty. The patient tolerated                            the procedure well. The bowel preparation used was                            SUPREP via split dose instruction. Scope In: 12:01:07 PM Scope Out: 12:13:50 PM Scope Withdrawal Time: 0 hours 11 minutes 6 seconds  Total Procedure Duration: 0 hours 12 minutes 43 seconds  Findings:                 Three polyps were found in the sigmoid colon,                            transverse colon and ascending colon. The polyps                            were 2 to 4 mm in size. These polyps were removed                            with a cold snare. Resection and retrieval were  complete.                           Multiple diverticula were found in the transverse                            colon and left colon.                           Internal hemorrhoids were found during retroflexion.                           The exam was otherwise without abnormality on                            direct and retroflexion views. Complications:            No immediate complications. Estimated blood loss:                            None. Estimated Blood Loss:     Estimated blood loss: none. Impression:               - Three 2 to 4 mm polyps in the sigmoid colon, in                            the transverse colon and in the ascending colon,                            removed with a cold snare. Resected and retrieved.                           - Diverticulosis in the transverse colon and in the                             left colon.                           - Internal hemorrhoids.                           - The examination was otherwise normal on direct                            and retroflexion views. Recommendation:           - Repeat colonoscopy in 5 years for surveillance.                           - Resume Plavix  (clopidogrel ) today at prior dose.                           - Patient has a contact number available for                            emergencies. The signs and symptoms of potential  delayed complications were discussed with the                            patient. Return to normal activities tomorrow.                            Written discharge instructions were provided to the                            patient.                           - Resume previous diet.                           - Continue present medications.                           - Await pathology results. Norleen SAILOR. Abran, MD 10/21/2023 12:19:27 PM This report has been signed electronically.

## 2023-10-21 NOTE — Progress Notes (Signed)
 Expand All Collapse All        09/17/2023 CHANCELLOR VANDERLOOP [id number] [birth year]     CHIEF COMPLAINT: Schedule a colonoscopy   HISTORY OF PRESENT ILLNESS: Guido O. Goin is a 58 year old male with a past medical history of arthritis, anxiety, hypertension, hyperlipidemia, nonobstructive CAD, GERD, diverticulosis and colon polyps. S/P lumbar fusion L5-S1 07/2019. He presents to our office today to schedule a colonoscopy.  He denies having any upper or lower abdominal pain.  He occasionally sees a small amount of bright red blood on the stool which occurs once or twice monthly without draining or constipation.  No associated anorectal pain.  He remains on Plavix  and ASA secondary to nonobstructive CAD, multiple risk factors for CAD and significant family history of CAD including a brother who died from a MI at the age of 24.  He denies having any chest pain, palpitations or shortness of breath.  He has a history of GERD in the past which resolved after he reduced alcohol intake.  He no longer drinks shots of liquor and drinks 4 beers most evenings. He quit smoking cigarettes 2 to 3 years ago.  No known family history of colon polyps or colorectal cancer.  EGD 10/2017 was normal.  Colonoscopy 10/2017 identified 2 tubular adenomatous polyps removed from the ascending colon, diverticulosis and internal hemorrhoids.   Labs 06/14/2023: WBC 9.4.  Hemoglobin 14.2.  Hematocrit 42.8.  Platelets 352.  Glucose 111.  BUN 15.  Creatinine 0.82.  Sodium 138.  Potassium 4.6.  Total bili 0.5.  Alk phos 86.  AST 27.  ALT 24.   Stress ECHO 02/2021: Negative for ischemia. LV EF 60 - 63%.    Coronary CTA 08/11/2023:  Calcium  score of 640.5.  This places the patient in the 99th percentile for age and race based on the [facility] data base.  There is no significant coronary artery stenosis.      PAST GI PROCEDURES:    EGD 10/14/2017: - Normal esophagus.  - Normal stomach.  - Normal examined duodenum.  - No specimens  collected.   Colonoscopy 10/14/2017: - Two 2 to 4 mm polyps in the ascending colon, removed with a cold snare. Resected and retrieved. - Diverticulosis in the transverse colon and in the left colon. - Internal hemorrhoids. - The examination was otherwise normal on direct and retroflexion views. - 5 year recall colonoscopy  Surgical [P], ascending, polyp - TUBULAR ADENOMA (ONE). - NO HIGH GRADE DYSPLASIA OR MALIGNANCY.       Past Medical History:  Diagnosis Date   Allergy     Anemia     Angina at rest     Arthritis     Chest pain     Gastritis     Gastrointestinal hemorrhage     GERD (gastroesophageal reflux disease)     Hyperlipidemia     Hypertension     Tobacco abuse               Past Surgical History:  Procedure Laterality Date   ABDOMINAL EXPOSURE N/A 07/13/2019    Procedure: ABDOMINAL EXPOSURE;  Surgeon: Serene Gaile ORN, MD;  Location: [facility] OR;  Service: Vascular;  Laterality: N/A;   ANTERIOR LUMBAR FUSION N/A 07/13/2019    Procedure: ANTERIOR LUMBAR FUSION (ALIF) L5-S1;  Surgeon: Burnetta Aures, MD;  Location: [facility] OR;  Service: Orthopedics;  Laterality: N/A;   CARDIAC CATHETERIZATION       CORONARY PRESSURE/FFR STUDY N/A 09/24/2017    Procedure: INTRAVASCULAR  PRESSURE WIRE/FFR STUDY;  Surgeon: Claudene Victory ORN, MD;  Location: [facility] INVASIVE CV LAB;  Service: Cardiovascular;  Laterality: N/A;   HARVEST BONE GRAFT Left 07/13/2019    Procedure: HARVEST ILIAC BONE GRAFT;  Surgeon: Burnetta Aures, MD;  Location: [facility] OR;  Service: Orthopedics;  Laterality: Left;   LEFT HEART CATH AND CORONARY ANGIOGRAPHY N/A 09/24/2017    Procedure: LEFT HEART CATH AND CORONARY ANGIOGRAPHY;  Surgeon: Claudene Victory ORN, MD;  Location: [facility] IN[facility];  Service: Cardiovascular;  Laterality: N/A;   TOOTH EXTRACTION            Social History: He is married.  Quit smoking cigarettes 2 to 3 years ago.  He drinks 4 beers daily.  No drug use.   Family History: family history of early CAD with brother dying of  an MI at age 15. Mother and father with history of CAD/MI.  No known family history of esophageal, gastric or colon cancer.   Allergies       Allergies  Allergen Reactions   Nsaids        Internal bleeding              Outpatient Encounter Medications as of 09/17/2023  Medication Sig   amLODipine  (NORVASC ) 10 MG tablet Take 1 tablet (10 mg total) by mouth daily.   amLODipine  (NORVASC ) 5 MG tablet Take 5 mg by mouth daily.   aspirin  81 MG EC tablet Take by mouth.   atorvastatin  (LIPITOR ) 80 MG tablet Take 1 tablet (80 mg total) by mouth daily. (Patient taking differently: Take 80 mg by mouth daily at 6 PM. )   Bempedoic Acid -Ezetimibe  (NEXLIZET ) 180-10 MG TABS Take 1 tablet by mouth daily.   clopidogrel  (PLAVIX ) 75 MG tablet Take 1 tablet (75 mg total) by mouth daily. Please schedule appointment for future refills. 1st attempt. Thank you   enoxaparin  (LOVENOX ) 40 MG/0.4ML injection Inject into the skin.   enoxaparin  (LOVENOX ) 40 MG/0.4ML injection Inject into the skin.   fluticasone (FLONASE) 50 MCG/ACT nasal spray Place 1 spray into both nostrils daily as needed for allergies or rhinitis.   hydrochlorothiazide  (MICROZIDE ) 12.5 MG capsule TAKE 1 CAPSULE BY MOUTH ONCE DAILY .   methocarbamol  (ROBAXIN ) 500 MG tablet Take by mouth.   methocarbamol  (ROBAXIN ) 500 MG tablet Take 500 mg by mouth 3 (three) times daily as needed.   metoprolol  succinate (TOPROL -XL) 25 MG 24 hr tablet TAKE 1 TABLET BY MOUTH ONCE DAILY WITH  OR  IMMEDIATELY  FOLLOWING  A  MEAL (Patient taking differently: Take 25 mg by mouth daily. )   ondansetron  (ZOFRAN ) 4 MG tablet Take 1 tablet (4 mg total) by mouth every 8 (eight) hours as needed for nausea or vomiting.   oxyCODONE -acetaminophen  (PERCOCET/ROXICET) 5-325 MG tablet Take by mouth.   predniSONE  (DELTASONE ) 50 MG tablet 1 p.o. daily x 4      No facility-administered encounter medications on file as of 09/17/2023.      REVIEW OF SYSTEMS:  Gen: + Fatigue.Denies  fever, sweats or chills. No weight loss.  CV: Denies chest pain, palpitations or edema. Resp: Denies cough, shortness of breath of hemoptysis.  GI: See HPI. GU: Denies urinary burning, blood in urine, increased urinary frequency or incontinence. MS: + Arthritis and back pain. Derm: Denies rash, itchiness, skin lesions or unhealing ulcers. Psych: + Anxiety.  Heme: Denies bruising, easy bleeding. Neuro:  Denies headaches, dizziness or paresthesias. Endo:  Denies any problems with DM, thyroid or adrenal function.  PHYSICAL EXAM: BP 118/80   Pulse 77   Ht 6' 4 (1.93 m)   Wt 230 lb (104.3 kg)   BMI 28.00 kg/m    General: 58 year old male in no acute distress. Head: Normocephalic and atraumatic. Eyes:  Sclerae non-icteric, conjunctive pink. Ears: Normal auditory acuity. Mouth: Dentition intact. No ulcers or lesions.  Neck: Supple, no lymphadenopathy or thyromegaly.  Lungs: Clear bilaterally to auscultation without wheezes, crackles or rhonchi. Heart: Regular rate and rhythm. No murmur, rub or gallop appreciated.  Abdomen: Soft, nontender, nondistended. No masses. No hepatosplenomegaly. Normoactive bowel sounds x 4 quadrants.  Rectal: Patient declined rectal exam, wishes to proceed with colonoscopy for further evaluation. Musculoskeletal: Symmetrical with no gross deformities. Skin: Warm and dry. No rash or lesions on visible extremities. Extremities: No edema. Neurological: Alert oriented x 4, no focal deficits.  Psychological: Alert and cooperative. Normal mood and affect.   ASSESSMENT AND PLAN:   58 year old male with a history of tubular adenomatous polyps per colonoscopy 10/2017 and intermittent bright red rectal bleeding presents to schedule a colonoscopy.  On Plavix  and ASA. -Colonoscopy benefits and risks discussed including risk with sedation, risk of bleeding, perforation and infection  - Our office will contact the patient's cardiologist Dr. Youlanda Juba to verify  Plavix  hold instructions prior to proceeding with a colonoscopy - Apply a small amount of Desitin inside the anal opening and to the external anal area three times daily as needed for anal or hemorrhoidal irritation/bleeding.  - Patient to contact office if rectal bleeding worsens prior to colonoscopy date   Prior history of GERD, asymptomatic.  Reflux symptoms abated after he reduced alcohol intake.  Normal EGD in 2019. - Patient encouraged to further reduce alcohol intake - Patient to contact office if GERD symptoms recur   Nonobstructive CAD per cardiac cath 09/2017. Coronary CTA 08/11/2023 showed a calcium  score of 640.5. (This places the patient in the 99th percentile for age and race based on the [facility] data base). No significant coronary artery stenosis. Significant family history of CAD. On Plavix  and ASA. No angina.  -Patient will contact our office if he has any change in his cardiac status prior to his colonoscopy date - See request regarding Plavix  hold instructions above          CC:  Levora Reyes SAUNDERS, MD        Recent complete GI office H&P as above.  No interval change.  Now for colonoscopy.

## 2023-10-21 NOTE — Progress Notes (Signed)
 Called to room to assist during endoscopic procedure.  Patient ID and intended procedure confirmed with present staff. Received instructions for my participation in the procedure from the performing physician.

## 2023-10-21 NOTE — Progress Notes (Signed)
 Report to PACU, RN, vss, BBS= Clear.

## 2023-10-21 NOTE — Patient Instructions (Addendum)
 Educational handout provided to patient related to Hemorrhoids, Polyps, and Diverticulosis  Resume previous diet  RESUME PLAVIX TODAY AT PRIOR DOSE.  Awaiting pathology results   YOU HAD AN ENDOSCOPIC PROCEDURE TODAY AT THE [facility]:   Refer to the procedure report that was given to you for any specific questions about what was found during the examination.  If the procedure report does not answer your questions, please call your gastroenterologist to clarify.  If you requested that your care partner not be given the details of your procedure findings, then the procedure report has been included in a sealed envelope for you to review at your convenience later.  YOU SHOULD EXPECT: Some feelings of bloating in the abdomen. Passage of more gas than usual.  Walking can help get rid of the air that was put into your GI tract during the procedure and reduce the bloating. If you had a lower endoscopy (such as a colonoscopy or flexible sigmoidoscopy) you may notice spotting of blood in your stool or on the toilet paper. If you underwent a bowel prep for your procedure, you may not have a normal bowel movement for a few days.  Please Note:  You might notice some irritation and congestion in your nose or some drainage.  This is from the oxygen used during your procedure.  There is no need for concern and it should clear up in a day or so.  SYMPTOMS TO REPORT IMMEDIATELY:  Following lower endoscopy (colonoscopy or flexible sigmoidoscopy):  Excessive amounts of blood in the stool  Significant tenderness or worsening of abdominal pains  Swelling of the abdomen that is new, acute  Fever of 100F or higher  For urgent or emergent issues, a gastroenterologist can be reached at any hour by calling [phone]. Do not use MyChart messaging for urgent concerns.    DIET:  We do recommend a small meal at first, but then you may proceed to your regular diet.  Drink plenty of fluids but you  should avoid alcoholic beverages for 24 hours.  ACTIVITY:  You should plan to take it easy for the rest of today and you should NOT DRIVE or use heavy machinery until tomorrow (because of the sedation medicines used during the test).    FOLLOW UP: Our staff will call the number listed on your records the next business day following your procedure.  We will call around 7:15- 8:00 am to check on you and address any questions or concerns that you may have regarding the information given to you following your procedure. If we do not reach you, we will leave a message.     If any biopsies were taken you will be contacted by phone or by letter within the next 1-3 weeks.  Please call us at [phone] if you have not heard about the biopsies in 3 weeks.    SIGNATURES/CONFIDENTIALITY: You and/or your care partner have signed paperwork which will be entered into your electronic medical record.  These signatures attest to the fact that that the information above on your After Visit Summary has been reviewed and is understood.  Full responsibility of the confidentiality of this discharge information lies with you and/or your care-partner.

## 2023-10-22 ENCOUNTER — Ambulatory Visit: Payer: Self-pay | Admitting: Nurse Practitioner

## 2023-10-22 ENCOUNTER — Other Ambulatory Visit: Payer: Self-pay

## 2023-10-22 ENCOUNTER — Telehealth: Payer: Self-pay

## 2023-10-22 MED ORDER — MAGNESIUM OXIDE 400 MG PO TABS: 400.0000 mg | ORAL_TABLET | Freq: Every day | ORAL | 0 refills | Status: AC

## 2023-10-22 NOTE — Telephone Encounter (Signed)
  Follow up Call-     10/21/2023   11:07 AM  Call back number  Post procedure Call Back phone  # [phone]  Permission to leave phone message Yes     Patient questions:  Do you have a fever, pain , or abdominal swelling? No. Pain Score  0 *  Have you tolerated food without any problems? Yes.    Have you been able to return to your normal activities? Yes.    Do you have any questions about your discharge instructions: Diet   No. Medications  No. Follow up visit  No.  Do you have questions or concerns about your Care? No.  Actions: * If pain score is 4 or above: No action needed, pain <4.

## 2023-10-25 ENCOUNTER — Other Ambulatory Visit: Payer: Self-pay | Admitting: Orthopedic Surgery

## 2023-10-25 LAB — SURGICAL PATHOLOGY

## 2023-10-26 ENCOUNTER — Ambulatory Visit: Payer: Self-pay | Admitting: Internal Medicine

## 2023-11-18 ENCOUNTER — Ambulatory Visit (HOSPITAL_BASED_OUTPATIENT_CLINIC_OR_DEPARTMENT_OTHER): Admission: RE | Admit: 2023-11-18 | Source: Home / Self Care | Admitting: Orthopedic Surgery

## 2023-11-18 SURGERY — RELEASE, FIRST DORSAL COMPARTMENT, HAND
Anesthesia: Choice | Site: Hand | Laterality: Left
